# Patient Record
Sex: Female | Born: 1950 | Race: White | Hispanic: No | Marital: Married | Smoking: Never smoker
Health system: Southern US, Community
[De-identification: ages and names within clinical notes are randomized; demographics above are authoritative.]

## PROBLEM LIST (undated history)

## (undated) DIAGNOSIS — C801 Malignant (primary) neoplasm, unspecified: Secondary | ICD-10-CM

## (undated) DIAGNOSIS — T4145XA Adverse effect of unspecified anesthetic, initial encounter: Secondary | ICD-10-CM

## (undated) DIAGNOSIS — E875 Hyperkalemia: Secondary | ICD-10-CM

## (undated) DIAGNOSIS — I10 Essential (primary) hypertension: Secondary | ICD-10-CM

## (undated) DIAGNOSIS — F411 Generalized anxiety disorder: Secondary | ICD-10-CM

## (undated) DIAGNOSIS — M1990 Unspecified osteoarthritis, unspecified site: Secondary | ICD-10-CM

## (undated) DIAGNOSIS — T8859XA Other complications of anesthesia, initial encounter: Secondary | ICD-10-CM

## (undated) HISTORY — PX: SKIN CANCER EXCISION: SHX779

## (undated) HISTORY — PX: TOTAL HIP ARTHROPLASTY: SHX124

## (undated) HISTORY — DX: Generalized anxiety disorder: F41.1

## (undated) HISTORY — DX: Hyperkalemia: E87.5

## (undated) HISTORY — DX: Essential (primary) hypertension: I10

---

## 2007-08-23 LAB — HM COLONOSCOPY

## 2013-11-23 DIAGNOSIS — IMO0002 Reserved for concepts with insufficient information to code with codable children: Secondary | ICD-10-CM | POA: Diagnosis not present

## 2013-11-23 DIAGNOSIS — M25569 Pain in unspecified knee: Secondary | ICD-10-CM | POA: Diagnosis not present

## 2013-11-23 DIAGNOSIS — M1710 Unilateral primary osteoarthritis, unspecified knee: Secondary | ICD-10-CM | POA: Diagnosis not present

## 2014-01-03 DIAGNOSIS — M1710 Unilateral primary osteoarthritis, unspecified knee: Secondary | ICD-10-CM | POA: Diagnosis not present

## 2014-01-03 DIAGNOSIS — IMO0002 Reserved for concepts with insufficient information to code with codable children: Secondary | ICD-10-CM | POA: Diagnosis not present

## 2014-04-06 DIAGNOSIS — H26499 Other secondary cataract, unspecified eye: Secondary | ICD-10-CM | POA: Diagnosis not present

## 2014-04-06 DIAGNOSIS — H43399 Other vitreous opacities, unspecified eye: Secondary | ICD-10-CM | POA: Diagnosis not present

## 2014-04-12 DIAGNOSIS — L570 Actinic keratosis: Secondary | ICD-10-CM | POA: Diagnosis not present

## 2014-04-12 DIAGNOSIS — Z85828 Personal history of other malignant neoplasm of skin: Secondary | ICD-10-CM | POA: Diagnosis not present

## 2014-04-12 DIAGNOSIS — L821 Other seborrheic keratosis: Secondary | ICD-10-CM | POA: Diagnosis not present

## 2014-08-30 DIAGNOSIS — Z6827 Body mass index (BMI) 27.0-27.9, adult: Secondary | ICD-10-CM | POA: Diagnosis not present

## 2014-08-30 DIAGNOSIS — J302 Other seasonal allergic rhinitis: Secondary | ICD-10-CM | POA: Diagnosis not present

## 2014-08-30 DIAGNOSIS — J329 Chronic sinusitis, unspecified: Secondary | ICD-10-CM | POA: Diagnosis not present

## 2014-08-30 DIAGNOSIS — J310 Chronic rhinitis: Secondary | ICD-10-CM | POA: Diagnosis not present

## 2014-12-08 DIAGNOSIS — I10 Essential (primary) hypertension: Secondary | ICD-10-CM | POA: Diagnosis not present

## 2014-12-08 DIAGNOSIS — M179 Osteoarthritis of knee, unspecified: Secondary | ICD-10-CM | POA: Diagnosis not present

## 2014-12-08 DIAGNOSIS — G2581 Restless legs syndrome: Secondary | ICD-10-CM | POA: Diagnosis not present

## 2014-12-08 DIAGNOSIS — Z6827 Body mass index (BMI) 27.0-27.9, adult: Secondary | ICD-10-CM | POA: Diagnosis not present

## 2015-02-05 DIAGNOSIS — M9905 Segmental and somatic dysfunction of pelvic region: Secondary | ICD-10-CM | POA: Diagnosis not present

## 2015-02-05 DIAGNOSIS — M9904 Segmental and somatic dysfunction of sacral region: Secondary | ICD-10-CM | POA: Diagnosis not present

## 2015-02-05 DIAGNOSIS — M9902 Segmental and somatic dysfunction of thoracic region: Secondary | ICD-10-CM | POA: Diagnosis not present

## 2015-02-05 DIAGNOSIS — M9903 Segmental and somatic dysfunction of lumbar region: Secondary | ICD-10-CM | POA: Diagnosis not present

## 2015-02-07 DIAGNOSIS — M9904 Segmental and somatic dysfunction of sacral region: Secondary | ICD-10-CM | POA: Diagnosis not present

## 2015-02-07 DIAGNOSIS — M9903 Segmental and somatic dysfunction of lumbar region: Secondary | ICD-10-CM | POA: Diagnosis not present

## 2015-02-07 DIAGNOSIS — M9902 Segmental and somatic dysfunction of thoracic region: Secondary | ICD-10-CM | POA: Diagnosis not present

## 2015-02-07 DIAGNOSIS — M9905 Segmental and somatic dysfunction of pelvic region: Secondary | ICD-10-CM | POA: Diagnosis not present

## 2015-02-12 DIAGNOSIS — M9903 Segmental and somatic dysfunction of lumbar region: Secondary | ICD-10-CM | POA: Diagnosis not present

## 2015-02-12 DIAGNOSIS — M9904 Segmental and somatic dysfunction of sacral region: Secondary | ICD-10-CM | POA: Diagnosis not present

## 2015-02-12 DIAGNOSIS — M9902 Segmental and somatic dysfunction of thoracic region: Secondary | ICD-10-CM | POA: Diagnosis not present

## 2015-02-12 DIAGNOSIS — M9905 Segmental and somatic dysfunction of pelvic region: Secondary | ICD-10-CM | POA: Diagnosis not present

## 2015-04-11 DIAGNOSIS — D485 Neoplasm of uncertain behavior of skin: Secondary | ICD-10-CM | POA: Diagnosis not present

## 2015-04-11 DIAGNOSIS — L821 Other seborrheic keratosis: Secondary | ICD-10-CM | POA: Diagnosis not present

## 2015-04-11 DIAGNOSIS — C44722 Squamous cell carcinoma of skin of right lower limb, including hip: Secondary | ICD-10-CM | POA: Diagnosis not present

## 2015-04-11 DIAGNOSIS — L853 Xerosis cutis: Secondary | ICD-10-CM | POA: Diagnosis not present

## 2015-04-26 DIAGNOSIS — C44722 Squamous cell carcinoma of skin of right lower limb, including hip: Secondary | ICD-10-CM | POA: Diagnosis not present

## 2015-06-12 DIAGNOSIS — M9905 Segmental and somatic dysfunction of pelvic region: Secondary | ICD-10-CM | POA: Diagnosis not present

## 2015-06-12 DIAGNOSIS — M9902 Segmental and somatic dysfunction of thoracic region: Secondary | ICD-10-CM | POA: Diagnosis not present

## 2015-06-12 DIAGNOSIS — M9903 Segmental and somatic dysfunction of lumbar region: Secondary | ICD-10-CM | POA: Diagnosis not present

## 2015-06-12 DIAGNOSIS — M9904 Segmental and somatic dysfunction of sacral region: Secondary | ICD-10-CM | POA: Diagnosis not present

## 2015-09-24 DIAGNOSIS — M25561 Pain in right knee: Secondary | ICD-10-CM | POA: Diagnosis not present

## 2015-09-24 DIAGNOSIS — M1711 Unilateral primary osteoarthritis, right knee: Secondary | ICD-10-CM | POA: Diagnosis not present

## 2015-10-17 DIAGNOSIS — M1711 Unilateral primary osteoarthritis, right knee: Secondary | ICD-10-CM | POA: Diagnosis not present

## 2015-10-24 DIAGNOSIS — M1711 Unilateral primary osteoarthritis, right knee: Secondary | ICD-10-CM | POA: Diagnosis not present

## 2015-10-31 DIAGNOSIS — M1711 Unilateral primary osteoarthritis, right knee: Secondary | ICD-10-CM | POA: Diagnosis not present

## 2015-12-12 DIAGNOSIS — M1711 Unilateral primary osteoarthritis, right knee: Secondary | ICD-10-CM | POA: Diagnosis not present

## 2015-12-20 DIAGNOSIS — M461 Sacroiliitis, not elsewhere classified: Secondary | ICD-10-CM | POA: Diagnosis not present

## 2015-12-20 DIAGNOSIS — I10 Essential (primary) hypertension: Secondary | ICD-10-CM | POA: Diagnosis not present

## 2015-12-20 DIAGNOSIS — Z6825 Body mass index (BMI) 25.0-25.9, adult: Secondary | ICD-10-CM | POA: Diagnosis not present

## 2015-12-20 DIAGNOSIS — M179 Osteoarthritis of knee, unspecified: Secondary | ICD-10-CM | POA: Diagnosis not present

## 2016-01-10 DIAGNOSIS — M1711 Unilateral primary osteoarthritis, right knee: Secondary | ICD-10-CM | POA: Insufficient documentation

## 2016-01-10 DIAGNOSIS — Z96641 Presence of right artificial hip joint: Secondary | ICD-10-CM | POA: Diagnosis not present

## 2016-01-10 DIAGNOSIS — M21061 Valgus deformity, not elsewhere classified, right knee: Secondary | ICD-10-CM | POA: Diagnosis not present

## 2016-01-10 DIAGNOSIS — M1612 Unilateral primary osteoarthritis, left hip: Secondary | ICD-10-CM | POA: Diagnosis not present

## 2016-01-10 DIAGNOSIS — M47898 Other spondylosis, sacral and sacrococcygeal region: Secondary | ICD-10-CM | POA: Diagnosis not present

## 2016-01-10 HISTORY — DX: Unilateral primary osteoarthritis, right knee: M17.11

## 2016-01-17 ENCOUNTER — Other Ambulatory Visit: Payer: Self-pay | Admitting: Orthopedic Surgery

## 2016-01-23 DIAGNOSIS — G4762 Sleep related leg cramps: Secondary | ICD-10-CM | POA: Diagnosis not present

## 2016-01-23 DIAGNOSIS — Z6825 Body mass index (BMI) 25.0-25.9, adult: Secondary | ICD-10-CM | POA: Diagnosis not present

## 2016-02-01 DIAGNOSIS — C44729 Squamous cell carcinoma of skin of left lower limb, including hip: Secondary | ICD-10-CM | POA: Diagnosis not present

## 2016-02-01 DIAGNOSIS — D485 Neoplasm of uncertain behavior of skin: Secondary | ICD-10-CM | POA: Diagnosis not present

## 2016-02-14 DIAGNOSIS — C44729 Squamous cell carcinoma of skin of left lower limb, including hip: Secondary | ICD-10-CM | POA: Diagnosis not present

## 2016-02-28 ENCOUNTER — Encounter (HOSPITAL_COMMUNITY): Payer: Self-pay

## 2016-02-28 ENCOUNTER — Ambulatory Visit (HOSPITAL_COMMUNITY)
Admission: RE | Admit: 2016-02-28 | Discharge: 2016-02-28 | Disposition: A | Discharge status: Home / Self Care | Payer: Medicare Other | Source: Ambulatory Visit | Attending: Orthopedic Surgery | Admitting: Orthopedic Surgery

## 2016-02-28 ENCOUNTER — Encounter (HOSPITAL_COMMUNITY)
Admission: RE | Admit: 2016-02-28 | Discharge: 2016-02-28 | Disposition: A | Discharge status: Home / Self Care | Payer: Medicare Other | Source: Ambulatory Visit | Attending: Orthopedic Surgery | Admitting: Orthopedic Surgery

## 2016-02-28 ENCOUNTER — Other Ambulatory Visit (HOSPITAL_COMMUNITY): Payer: Self-pay | Admitting: *Deleted

## 2016-02-28 DIAGNOSIS — R9431 Abnormal electrocardiogram [ECG] [EKG]: Secondary | ICD-10-CM | POA: Diagnosis not present

## 2016-02-28 DIAGNOSIS — Z01818 Encounter for other preprocedural examination: Secondary | ICD-10-CM | POA: Insufficient documentation

## 2016-02-28 DIAGNOSIS — M1711 Unilateral primary osteoarthritis, right knee: Secondary | ICD-10-CM | POA: Insufficient documentation

## 2016-02-28 DIAGNOSIS — Z01812 Encounter for preprocedural laboratory examination: Secondary | ICD-10-CM | POA: Insufficient documentation

## 2016-02-28 HISTORY — DX: Malignant (primary) neoplasm, unspecified: C80.1

## 2016-02-28 HISTORY — DX: Unspecified osteoarthritis, unspecified site: M19.90

## 2016-02-28 HISTORY — DX: Other complications of anesthesia, initial encounter: T88.59XA

## 2016-02-28 HISTORY — DX: Adverse effect of unspecified anesthetic, initial encounter: T41.45XA

## 2016-02-28 HISTORY — DX: Essential (primary) hypertension: I10

## 2016-02-28 LAB — CBC WITH DIFFERENTIAL/PLATELET
Basophils Absolute: 0 10*3/uL (ref 0.0–0.1)
Basophils Relative: 0 %
EOS PCT: 1 %
Eosinophils Absolute: 0.1 10*3/uL (ref 0.0–0.7)
HCT: 42.7 % (ref 36.0–46.0)
HEMOGLOBIN: 14 g/dL (ref 12.0–15.0)
LYMPHS ABS: 2.6 10*3/uL (ref 0.7–4.0)
LYMPHS PCT: 24 %
MCH: 29.2 pg (ref 26.0–34.0)
MCHC: 32.8 g/dL (ref 30.0–36.0)
MCV: 89 fL (ref 78.0–100.0)
MONOS PCT: 5 %
Monocytes Absolute: 0.6 10*3/uL (ref 0.1–1.0)
Neutro Abs: 7.6 10*3/uL (ref 1.7–7.7)
Neutrophils Relative %: 70 %
PLATELETS: 367 10*3/uL (ref 150–400)
RBC: 4.8 MIL/uL (ref 3.87–5.11)
RDW: 14 % (ref 11.5–15.5)
WBC: 10.9 10*3/uL — AB (ref 4.0–10.5)

## 2016-02-28 LAB — COMPREHENSIVE METABOLIC PANEL
ALK PHOS: 80 U/L (ref 38–126)
ALT: 18 U/L (ref 14–54)
AST: 22 U/L (ref 15–41)
Albumin: 3.9 g/dL (ref 3.5–5.0)
Anion gap: 8 (ref 5–15)
BUN: 17 mg/dL (ref 6–20)
CALCIUM: 9.5 mg/dL (ref 8.9–10.3)
CO2: 27 mmol/L (ref 22–32)
CREATININE: 0.98 mg/dL (ref 0.44–1.00)
Chloride: 103 mmol/L (ref 101–111)
GFR, EST NON AFRICAN AMERICAN: 58 mL/min — AB (ref >60)
Glucose, Bld: 89 mg/dL (ref 65–99)
Potassium: 4.3 mmol/L (ref 3.5–5.1)
Sodium: 138 mmol/L (ref 135–145)
Total Bilirubin: 0.4 mg/dL (ref 0.3–1.2)
Total Protein: 6.8 g/dL (ref 6.5–8.1)

## 2016-02-28 LAB — URINALYSIS, ROUTINE W REFLEX MICROSCOPIC
BILIRUBIN URINE: NEGATIVE
GLUCOSE, UA: NEGATIVE mg/dL
HGB URINE DIPSTICK: NEGATIVE
KETONES UR: NEGATIVE mg/dL
Nitrite: NEGATIVE
PROTEIN: NEGATIVE mg/dL
Specific Gravity, Urine: 1.007 (ref 1.005–1.030)
pH: 7 (ref 5.0–8.0)

## 2016-02-28 LAB — URINE MICROSCOPIC-ADD ON: RBC / HPF: NONE SEEN RBC/hpf (ref 0–5)

## 2016-02-28 LAB — PROTIME-INR
INR: 0.91 (ref 0.00–1.49)
PROTHROMBIN TIME: 12.5 s (ref 11.6–15.2)

## 2016-02-28 LAB — APTT: aPTT: 25 seconds (ref 24–37)

## 2016-02-28 LAB — SURGICAL PCR SCREEN
MRSA, PCR: NEGATIVE
STAPHYLOCOCCUS AUREUS: NEGATIVE

## 2016-02-28 NOTE — Progress Notes (Signed)
Pt denies cardiologist or cardiac history or cardiac testing.

## 2016-02-28 NOTE — Pre-Procedure Instructions (Signed)
    Javonda Essig  02/28/2016     No Pharmacies Listed   Your procedure is scheduled on Monday July 24th at 9:35 am  Report to [facility] Admitting at 7:30 am  Call this number if you have problems the morning of surgery:  [phone]   Remember:  Do not eat food or drink liquids after midnight.  Take these medicines the morning of surgery with A SIP OF WATER: pain medicine if needed Stop taking any aspirin containing products, Nsaids( including your celebrex, aleve ibuprofen, advil, motrin, naproxen), vitamins and herbal supplements.  Do not wear jewelry, make-up or nail polish.  Do not wear lotions, powders, or perfumes.  You may not wear deoderant.  Do not shave 48 hours prior to surgery.   Do not bring valuables to the hospital.  [facility] is not responsible for any belongings or valuables.  Contacts, dentures or bridgework may not be worn into surgery.  Leave your suitcase in the car.  After surgery it may be brought to your room.  For patients admitted to the hospital, discharge time will be determined by your treatment team.   Special instructions:  Shower with CHG the night before and morning of surgery as instructed  Please read over the following fact sheets that you were given. MRSA Information, incentive spirometry, shower instructions

## 2016-02-29 LAB — URINE CULTURE

## 2016-03-07 MED ORDER — TRANEXAMIC ACID 1000 MG/10ML IV SOLN
1000.0000 mg | INTRAVENOUS | Status: AC
  Administered 2016-03-10: 1000 mg via INTRAVENOUS
  Filled 2016-03-07: qty 10

## 2016-03-10 ENCOUNTER — Encounter (HOSPITAL_COMMUNITY)
Admission: RE | Disposition: A | Discharge status: Home Health | Payer: Self-pay | Source: Ambulatory Visit | Attending: Orthopedic Surgery

## 2016-03-10 ENCOUNTER — Inpatient Hospital Stay (HOSPITAL_COMMUNITY): Payer: Medicare Other | Admitting: Anesthesiology

## 2016-03-10 ENCOUNTER — Encounter (HOSPITAL_COMMUNITY): Payer: Self-pay | Admitting: General Practice

## 2016-03-10 ENCOUNTER — Inpatient Hospital Stay (HOSPITAL_COMMUNITY)
Admission: RE | Admit: 2016-03-10 | Discharge: 2016-03-11 | DRG: 470 | Disposition: A | Discharge status: Home Health | Payer: Medicare Other | Source: Ambulatory Visit | Attending: Orthopedic Surgery | Admitting: Orthopedic Surgery

## 2016-03-10 DIAGNOSIS — I10 Essential (primary) hypertension: Secondary | ICD-10-CM | POA: Diagnosis present

## 2016-03-10 DIAGNOSIS — Z96641 Presence of right artificial hip joint: Secondary | ICD-10-CM | POA: Diagnosis present

## 2016-03-10 DIAGNOSIS — M1711 Unilateral primary osteoarthritis, right knee: Principal | ICD-10-CM | POA: Diagnosis present

## 2016-03-10 DIAGNOSIS — Z96659 Presence of unspecified artificial knee joint: Secondary | ICD-10-CM

## 2016-03-10 DIAGNOSIS — D62 Acute posthemorrhagic anemia: Secondary | ICD-10-CM | POA: Diagnosis not present

## 2016-03-10 DIAGNOSIS — M25561 Pain in right knee: Secondary | ICD-10-CM | POA: Diagnosis not present

## 2016-03-10 DIAGNOSIS — M179 Osteoarthritis of knee, unspecified: Secondary | ICD-10-CM | POA: Diagnosis not present

## 2016-03-10 HISTORY — PX: TOTAL KNEE ARTHROPLASTY: SHX125

## 2016-03-10 HISTORY — DX: Presence of unspecified artificial knee joint: Z96.659

## 2016-03-10 SURGERY — ARTHROPLASTY, KNEE, TOTAL
Anesthesia: Monitor Anesthesia Care | Site: Knee | Laterality: Right

## 2016-03-10 MED ORDER — BUPIVACAINE-EPINEPHRINE (PF) 0.25% -1:200000 IJ SOLN
INTRAMUSCULAR | Status: DC | PRN
  Administered 2016-03-10: 20 mL

## 2016-03-10 MED ORDER — HYDROMORPHONE HCL 1 MG/ML IJ SOLN
1.0000 mg | INTRAMUSCULAR | Status: DC | PRN
  Administered 2016-03-10 – 2016-03-11 (×4): 1 mg via INTRAVENOUS
  Filled 2016-03-10 (×5): qty 1

## 2016-03-10 MED ORDER — MENTHOL 3 MG MT LOZG: 1.0000 | LOZENGE | OROMUCOSAL | Status: DC | PRN

## 2016-03-10 MED ORDER — OXYCODONE HCL 5 MG PO TABS
5.0000 mg | ORAL_TABLET | ORAL | Status: DC | PRN
  Administered 2016-03-10 – 2016-03-11 (×6): 10 mg via ORAL
  Filled 2016-03-10 (×6): qty 2

## 2016-03-10 MED ORDER — PROPOFOL 500 MG/50ML IV EMUL
INTRAVENOUS | Status: DC | PRN
  Administered 2016-03-10: 50 ug/kg/min via INTRAVENOUS

## 2016-03-10 MED ORDER — SODIUM CHLORIDE 0.9 % IV SOLN
INTRAVENOUS | Status: DC
  Administered 2016-03-10: 15:00:00 via INTRAVENOUS

## 2016-03-10 MED ORDER — OXYCODONE HCL ER 10 MG PO T12A
10.0000 mg | EXTENDED_RELEASE_TABLET | Freq: Two times a day (BID) | ORAL | Status: DC
  Administered 2016-03-10 – 2016-03-11 (×3): 10 mg via ORAL
  Filled 2016-03-10 (×3): qty 1

## 2016-03-10 MED ORDER — ASPIRIN EC 325 MG PO TBEC
325.0000 mg | DELAYED_RELEASE_TABLET | Freq: Two times a day (BID) | ORAL | Status: DC
  Administered 2016-03-11: 325 mg via ORAL
  Filled 2016-03-10: qty 1

## 2016-03-10 MED ORDER — PHENOL 1.4 % MT LIQD: 1.0000 | OROMUCOSAL | Status: DC | PRN

## 2016-03-10 MED ORDER — ONDANSETRON HCL 4 MG/2ML IJ SOLN
4.0000 mg | Freq: Four times a day (QID) | INTRAMUSCULAR | Status: DC | PRN
  Administered 2016-03-11: 4 mg via INTRAVENOUS
  Filled 2016-03-10: qty 2

## 2016-03-10 MED ORDER — FENTANYL CITRATE (PF) 100 MCG/2ML IJ SOLN
INTRAMUSCULAR | Status: DC | PRN
  Administered 2016-03-10: 50 ug via INTRAVENOUS
  Administered 2016-03-10: 25 ug via INTRAVENOUS

## 2016-03-10 MED ORDER — ALUM & MAG HYDROXIDE-SIMETH 200-200-20 MG/5ML PO SUSP: 30.0000 mL | ORAL | Status: DC | PRN

## 2016-03-10 MED ORDER — ACETAMINOPHEN 10 MG/ML IV SOLN
INTRAVENOUS | Status: DC | PRN
  Administered 2016-03-10: 1000 mg via INTRAVENOUS

## 2016-03-10 MED ORDER — METOCLOPRAMIDE HCL 5 MG PO TABS: 5.0000 mg | ORAL_TABLET | Freq: Three times a day (TID) | ORAL | Status: DC | PRN

## 2016-03-10 MED ORDER — PROMETHAZINE HCL 25 MG/ML IJ SOLN: 6.2500 mg | INTRAMUSCULAR | Status: DC | PRN

## 2016-03-10 MED ORDER — ONDANSETRON HCL 4 MG/2ML IJ SOLN
INTRAMUSCULAR | Status: DC | PRN
  Administered 2016-03-10: 4 mg via INTRAVENOUS

## 2016-03-10 MED ORDER — MIDAZOLAM HCL 2 MG/2ML IJ SOLN
INTRAMUSCULAR | Status: AC
  Filled 2016-03-10: qty 2

## 2016-03-10 MED ORDER — DEXAMETHASONE SODIUM PHOSPHATE 10 MG/ML IJ SOLN
10.0000 mg | Freq: Once | INTRAMUSCULAR | Status: AC
  Administered 2016-03-11: 10 mg via INTRAVENOUS
  Filled 2016-03-10: qty 1

## 2016-03-10 MED ORDER — 0.9 % SODIUM CHLORIDE (POUR BTL) OPTIME
TOPICAL | Status: DC | PRN
  Administered 2016-03-10: 1000 mL

## 2016-03-10 MED ORDER — MIDAZOLAM HCL 5 MG/5ML IJ SOLN
INTRAMUSCULAR | Status: DC | PRN
  Administered 2016-03-10: 2 mg via INTRAVENOUS

## 2016-03-10 MED ORDER — SODIUM CHLORIDE 0.9 % IJ SOLN
INTRAMUSCULAR | Status: DC | PRN
  Administered 2016-03-10: 20 mL

## 2016-03-10 MED ORDER — ONDANSETRON HCL 4 MG PO TABS: 4.0000 mg | ORAL_TABLET | Freq: Four times a day (QID) | ORAL | Status: DC | PRN

## 2016-03-10 MED ORDER — CEFAZOLIN IN D5W 1 GM/50ML IV SOLN
1.0000 g | Freq: Four times a day (QID) | INTRAVENOUS | Status: AC
  Administered 2016-03-10 (×2): 1 g via INTRAVENOUS
  Filled 2016-03-10 (×2): qty 50

## 2016-03-10 MED ORDER — OXYCODONE HCL 5 MG PO TABS
5.0000 mg | ORAL_TABLET | Freq: Once | ORAL | Status: DC | PRN
Start: 2016-03-10 — End: 2016-03-10

## 2016-03-10 MED ORDER — METHOCARBAMOL 1000 MG/10ML IJ SOLN
500.0000 mg | Freq: Four times a day (QID) | INTRAMUSCULAR | Status: DC | PRN
  Filled 2016-03-10: qty 5

## 2016-03-10 MED ORDER — DIPHENHYDRAMINE HCL 12.5 MG/5ML PO ELIX: 12.5000 mg | ORAL_SOLUTION | ORAL | Status: DC | PRN

## 2016-03-10 MED ORDER — CHLORHEXIDINE GLUCONATE 4 % EX LIQD: 60.0000 mL | Freq: Once | CUTANEOUS | Status: DC

## 2016-03-10 MED ORDER — LORAZEPAM 0.5 MG PO TABS
0.5000 mg | ORAL_TABLET | Freq: Three times a day (TID) | ORAL | Status: DC | PRN
  Administered 2016-03-11: 0.5 mg via ORAL
  Filled 2016-03-10: qty 1

## 2016-03-10 MED ORDER — TRANEXAMIC ACID 1000 MG/10ML IV SOLN
1000.0000 mg | Freq: Once | INTRAVENOUS | Status: AC
  Administered 2016-03-10: 1000 mg via INTRAVENOUS
  Filled 2016-03-10: qty 10

## 2016-03-10 MED ORDER — METOCLOPRAMIDE HCL 5 MG/ML IJ SOLN: 5.0000 mg | Freq: Three times a day (TID) | INTRAMUSCULAR | Status: DC | PRN

## 2016-03-10 MED ORDER — HYDROMORPHONE HCL 1 MG/ML IJ SOLN: 0.2500 mg | INTRAMUSCULAR | Status: DC | PRN

## 2016-03-10 MED ORDER — IRBESARTAN 150 MG PO TABS
150.0000 mg | ORAL_TABLET | Freq: Every day | ORAL | Status: DC
  Administered 2016-03-10 – 2016-03-11 (×2): 150 mg via ORAL
  Filled 2016-03-10 (×2): qty 1

## 2016-03-10 MED ORDER — FENTANYL CITRATE (PF) 250 MCG/5ML IJ SOLN
INTRAMUSCULAR | Status: AC
  Filled 2016-03-10: qty 5

## 2016-03-10 MED ORDER — BUPIVACAINE LIPOSOME 1.3 % IJ SUSP
20.0000 mL | INTRAMUSCULAR | Status: AC
  Administered 2016-03-10: 20 mL
  Filled 2016-03-10: qty 20

## 2016-03-10 MED ORDER — ACETAMINOPHEN 650 MG RE SUPP: 650.0000 mg | Freq: Four times a day (QID) | RECTAL | Status: DC | PRN

## 2016-03-10 MED ORDER — SODIUM CHLORIDE 0.9 % IR SOLN
Status: DC | PRN
  Administered 2016-03-10: 1000 mL

## 2016-03-10 MED ORDER — METHOCARBAMOL 500 MG PO TABS
500.0000 mg | ORAL_TABLET | Freq: Four times a day (QID) | ORAL | Status: DC | PRN
  Administered 2016-03-10 – 2016-03-11 (×3): 500 mg via ORAL
  Filled 2016-03-10 (×3): qty 1

## 2016-03-10 MED ORDER — PHENYLEPHRINE 40 MCG/ML (10ML) SYRINGE FOR IV PUSH (FOR BLOOD PRESSURE SUPPORT)
PREFILLED_SYRINGE | INTRAVENOUS | Status: AC
  Filled 2016-03-10: qty 10

## 2016-03-10 MED ORDER — PHENYLEPHRINE HCL 10 MG/ML IJ SOLN
INTRAVENOUS | Status: DC | PRN
  Administered 2016-03-10: 40 ug/min via INTRAVENOUS

## 2016-03-10 MED ORDER — PROPOFOL 10 MG/ML IV BOLUS
INTRAVENOUS | Status: DC | PRN
  Administered 2016-03-10: 10 mg via INTRAVENOUS
  Administered 2016-03-10: 20 mg via INTRAVENOUS
  Administered 2016-03-10: 10 mg via INTRAVENOUS

## 2016-03-10 MED ORDER — ONDANSETRON HCL 4 MG/2ML IJ SOLN
INTRAMUSCULAR | Status: AC
  Filled 2016-03-10: qty 2

## 2016-03-10 MED ORDER — ACETAMINOPHEN 325 MG PO TABS
650.0000 mg | ORAL_TABLET | Freq: Four times a day (QID) | ORAL | Status: DC | PRN
  Administered 2016-03-11: 650 mg via ORAL
  Filled 2016-03-10: qty 2

## 2016-03-10 MED ORDER — ACETAMINOPHEN 500 MG PO TABS
1000.0000 mg | ORAL_TABLET | Freq: Four times a day (QID) | ORAL | Status: AC
Start: 2016-03-10 — End: 2016-03-11
  Administered 2016-03-10 – 2016-03-11 (×2): 1000 mg via ORAL
  Filled 2016-03-10 (×3): qty 2

## 2016-03-10 MED ORDER — PROPOFOL 1000 MG/100ML IV EMUL
INTRAVENOUS | Status: AC
  Filled 2016-03-10: qty 100

## 2016-03-10 MED ORDER — ACETAMINOPHEN 500 MG PO TABS
1000.0000 mg | ORAL_TABLET | Freq: Once | ORAL | Status: AC
  Administered 2016-03-10: 1000 mg via ORAL
  Filled 2016-03-10: qty 2

## 2016-03-10 MED ORDER — ZOLPIDEM TARTRATE 5 MG PO TABS: 5.0000 mg | ORAL_TABLET | Freq: Every evening | ORAL | Status: DC | PRN

## 2016-03-10 MED ORDER — CEFAZOLIN SODIUM-DEXTROSE 2-4 GM/100ML-% IV SOLN
2.0000 g | INTRAVENOUS | Status: AC
Start: 2016-03-10 — End: 2016-03-10
  Administered 2016-03-10: 2 g via INTRAVENOUS
  Filled 2016-03-10: qty 100

## 2016-03-10 MED ORDER — ACETAMINOPHEN 10 MG/ML IV SOLN
INTRAVENOUS | Status: AC
  Filled 2016-03-10: qty 100

## 2016-03-10 MED ORDER — BUPIVACAINE HCL (PF) 0.75 % IJ SOLN
INTRAMUSCULAR | Status: DC | PRN
  Administered 2016-03-10: 2 mL via INTRATHECAL

## 2016-03-10 MED ORDER — BUPIVACAINE-EPINEPHRINE (PF) 0.25% -1:200000 IJ SOLN
INTRAMUSCULAR | Status: AC
  Filled 2016-03-10: qty 30

## 2016-03-10 MED ORDER — OXYCODONE HCL 5 MG/5ML PO SOLN: 5.0000 mg | Freq: Once | ORAL | Status: DC | PRN

## 2016-03-10 MED ORDER — SODIUM CHLORIDE 0.9 % IV SOLN: INTRAVENOUS | Status: DC

## 2016-03-10 MED ORDER — LACTATED RINGERS IV SOLN
INTRAVENOUS | Status: DC
  Administered 2016-03-10 (×2): via INTRAVENOUS

## 2016-03-10 MED ORDER — DOCUSATE SODIUM 100 MG PO CAPS
100.0000 mg | ORAL_CAPSULE | Freq: Two times a day (BID) | ORAL | Status: DC
  Administered 2016-03-10 (×2): 100 mg via ORAL
  Filled 2016-03-10 (×3): qty 1

## 2016-03-10 MED ORDER — PHENYLEPHRINE HCL 10 MG/ML IJ SOLN
INTRAMUSCULAR | Status: DC | PRN
  Administered 2016-03-10: 100 ug via INTRAVENOUS
  Administered 2016-03-10: 40 ug via INTRAVENOUS
  Administered 2016-03-10: 80 ug via INTRAVENOUS

## 2016-03-10 SURGICAL SUPPLY — 64 items
BANDAGE ACE 6X5 VEL STRL LF (GAUZE/BANDAGES/DRESSINGS) ×3 IMPLANT
BANDAGE ESMARK 6X9 LF (GAUZE/BANDAGES/DRESSINGS) ×1 IMPLANT
BLADE SAGITTAL 13X1.27X60 (BLADE) ×2 IMPLANT
BLADE SAGITTAL 13X1.27X60MM (BLADE) ×1
BLADE SAW SGTL 83.5X18.5 (BLADE) ×3 IMPLANT
BLADE SURG 10 STRL SS (BLADE) ×3 IMPLANT
BNDG ESMARK 6X9 LF (GAUZE/BANDAGES/DRESSINGS) ×3
BOWL SMART MIX CTS (DISPOSABLE) ×3 IMPLANT
CAP KNEE TOTAL 3 SIGMA ×3 IMPLANT
CEMENT BONE SIMPLEX SPEEDSET (Cement) ×6 IMPLANT
CLOSURE WOUND 1/2 X4 (GAUZE/BANDAGES/DRESSINGS) ×1
COVER SURGICAL LIGHT HANDLE (MISCELLANEOUS) ×3 IMPLANT
CUFF TOURNIQUET SINGLE 34IN LL (TOURNIQUET CUFF) ×3 IMPLANT
DRAPE EXTREMITY T 121X128X90 (DRAPE) ×3 IMPLANT
DRAPE INCISE IOBAN 66X45 STRL (DRAPES) ×9 IMPLANT
DRAPE PROXIMA HALF (DRAPES) ×3 IMPLANT
DRAPE U-SHAPE 47X51 STRL (DRAPES) ×3 IMPLANT
DRESSING AQUACEL AG SP 3.5X10 (GAUZE/BANDAGES/DRESSINGS) ×1 IMPLANT
DRSG ADAPTIC 3X8 NADH LF (GAUZE/BANDAGES/DRESSINGS) IMPLANT
DRSG AQUACEL AG SP 3.5X10 (GAUZE/BANDAGES/DRESSINGS) ×3
DRSG PAD ABDOMINAL 8X10 ST (GAUZE/BANDAGES/DRESSINGS) ×3 IMPLANT
DURAPREP 26ML APPLICATOR (WOUND CARE) ×6 IMPLANT
ELECT REM PT RETURN 9FT ADLT (ELECTROSURGICAL) ×3
ELECTRODE REM PT RTRN 9FT ADLT (ELECTROSURGICAL) ×1 IMPLANT
GAUZE SPONGE 4X4 12PLY STRL (GAUZE/BANDAGES/DRESSINGS) IMPLANT
GLOVE BIOGEL M 7.0 STRL (GLOVE) IMPLANT
GLOVE BIOGEL PI IND STRL 7.5 (GLOVE) IMPLANT
GLOVE BIOGEL PI IND STRL 8.5 (GLOVE) ×3 IMPLANT
GLOVE BIOGEL PI INDICATOR 7.5 (GLOVE)
GLOVE BIOGEL PI INDICATOR 8.5 (GLOVE) ×6
GLOVE SURG ORTHO 8.0 STRL STRW (GLOVE) ×9 IMPLANT
GOWN STRL REUS W/ TWL LRG LVL3 (GOWN DISPOSABLE) ×1 IMPLANT
GOWN STRL REUS W/ TWL XL LVL3 (GOWN DISPOSABLE) ×2 IMPLANT
GOWN STRL REUS W/TWL 2XL LVL3 (GOWN DISPOSABLE) ×3 IMPLANT
GOWN STRL REUS W/TWL LRG LVL3 (GOWN DISPOSABLE) ×2
GOWN STRL REUS W/TWL XL LVL3 (GOWN DISPOSABLE) ×4
HANDPIECE INTERPULSE COAX TIP (DISPOSABLE) ×2
HOOD PEEL AWAY FACE SHEILD DIS (HOOD) ×9 IMPLANT
KIT BASIN OR (CUSTOM PROCEDURE TRAY) ×3 IMPLANT
KIT ROOM TURNOVER OR (KITS) ×3 IMPLANT
MANIFOLD NEPTUNE II (INSTRUMENTS) ×3 IMPLANT
NEEDLE 22X1 1/2 (OR ONLY) (NEEDLE) ×6 IMPLANT
NS IRRIG 1000ML POUR BTL (IV SOLUTION) ×3 IMPLANT
PACK TOTAL JOINT (CUSTOM PROCEDURE TRAY) ×3 IMPLANT
PACK UNIVERSAL I (CUSTOM PROCEDURE TRAY) ×3 IMPLANT
PAD ARMBOARD 7.5X6 YLW CONV (MISCELLANEOUS) ×6 IMPLANT
PADDING CAST COTTON 6X4 STRL (CAST SUPPLIES) IMPLANT
SET HNDPC FAN SPRY TIP SCT (DISPOSABLE) ×1 IMPLANT
STAPLER VISISTAT 35W (STAPLE) IMPLANT
STRIP CLOSURE SKIN 1/2X4 (GAUZE/BANDAGES/DRESSINGS) ×2 IMPLANT
SUCTION FRAZIER HANDLE 10FR (MISCELLANEOUS) ×2
SUCTION TUBE FRAZIER 10FR DISP (MISCELLANEOUS) ×1 IMPLANT
SUT BONE WAX W31G (SUTURE) ×3 IMPLANT
SUT MNCRL AB 3-0 PS2 18 (SUTURE) ×3 IMPLANT
SUT VIC AB 0 CTB1 27 (SUTURE) ×6 IMPLANT
SUT VIC AB 1 CT1 27 (SUTURE) ×4
SUT VIC AB 1 CT1 27XBRD ANBCTR (SUTURE) ×2 IMPLANT
SUT VIC AB 2-0 CT1 27 (SUTURE) ×4
SUT VIC AB 2-0 CT1 TAPERPNT 27 (SUTURE) ×2 IMPLANT
SYR 20CC LL (SYRINGE) ×6 IMPLANT
TOWEL OR 17X24 6PK STRL BLUE (TOWEL DISPOSABLE) ×3 IMPLANT
TOWEL OR 17X26 10 PK STRL BLUE (TOWEL DISPOSABLE) ×3 IMPLANT
TRAY CATH 16FR W/PLASTIC CATH (SET/KITS/TRAYS/PACK) ×3 IMPLANT
WATER STERILE IRR 1000ML POUR (IV SOLUTION) ×6 IMPLANT

## 2016-03-10 NOTE — Progress Notes (Signed)
PT Cancellation Note  Patient Details Name: Susan Roberson MRN: [id number] DOB: 1951-07-21   Cancelled Treatment:    Reason Eval/Treat Not Completed: Pain limiting ability to participate. Pt's nurse stated that pt was in significant pain and requested that therapist attempt at another time. PT will continue to f/u with pt as appropriate.   [place] 03/10/2016, 3:18 PM Sherie Don, [place], DPT [phone]

## 2016-03-10 NOTE — Transfer of Care (Signed)
Immediate Anesthesia Transfer of Care Note  Patient: Susan Roberson  Procedure(s) Performed: Procedure(s): RIGHT TOTAL KNEE ARTHROPLASTY (Right)  Patient Location: PACU  Anesthesia Type:MAC and Spinal  Level of Consciousness: awake, alert  and oriented  Airway & Oxygen Therapy: Patient Spontanous Breathing and Patient connected to nasal cannula oxygen  Post-op Assessment: Report given to RN and Post -op Vital signs reviewed and stable  Post vital signs: Reviewed and stable  Last Vitals:  Vitals:   03/10/16 0721  BP: (!) 151/75  Pulse: 68  Resp: 18  Temp: 36.6 C    Last Pain:  Vitals:   03/10/16 0721  PainSc: 8       Patients Stated Pain Goal: 4 ([id number])  Complications: No apparent anesthesia complications

## 2016-03-10 NOTE — Progress Notes (Signed)
Patient arrived in pain from PACU.  Pain is 5/10.

## 2016-03-10 NOTE — Progress Notes (Signed)
Orthopedic Tech Progress Note Patient Details:  Susan Roberson 1951/07/19 [id number]  CPM Right Knee CPM Right Knee: On Right Knee Flexion (Degrees): 90 Right Knee Extension (Degrees): 0 Additional Comments: Trapeze bar and foot roll   [place] Pink 03/10/2016, 3:24 PM

## 2016-03-10 NOTE — Progress Notes (Signed)
Orthopedic Tech Progress Note Patient Details:  Susan Roberson [birth year] [id number]  CPM Right Knee CPM Right Knee: On Right Knee Flexion (Degrees): 90 Right Knee Extension (Degrees): 0 Additional Comments: Trapeze bar and foot roll   [place] Pink 03/10/2016, 1:32 PM

## 2016-03-10 NOTE — Anesthesia Preprocedure Evaluation (Signed)
Anesthesia Evaluation  Patient identified by MRN, date of birth, ID band Patient awake    Reviewed: Allergy & Precautions, H&P , NPO status , Patient's Chart, lab work & pertinent test results  History of Anesthesia Complications (+) history of anesthetic complications  Airway Mallampati: II  TM Distance: >3 FB Neck ROM: full    Dental no notable dental hx.    Pulmonary neg pulmonary ROS,    Pulmonary exam normal breath sounds clear to auscultation       Cardiovascular hypertension, Pt. on medications Normal cardiovascular exam Rhythm:regular Rate:Normal     Neuro/Psych negative neurological ROS     GI/Hepatic negative GI ROS, Neg liver ROS,   Endo/Other  negative endocrine ROS  Renal/GU negative Renal ROS     Musculoskeletal  (+) Arthritis ,   Abdominal   Peds  Hematology negative hematology ROS (+)   Anesthesia Other Findings   Reproductive/Obstetrics negative OB ROS                             Anesthesia Physical Anesthesia Plan  ASA: II  Anesthesia Plan: Spinal   Post-op Pain Management:    Induction: Intravenous  Airway Management Planned:   Additional Equipment:   Intra-op Plan:   Post-operative Plan:   Informed Consent: I have reviewed the patients History and Physical, chart, labs and discussed the procedure including the risks, benefits and alternatives for the proposed anesthesia with the patient or authorized representative who has indicated his/her understanding and acceptance.   Dental Advisory Given  Plan Discussed with: Anesthesiologist, CRNA and Surgeon  Anesthesia Plan Comments:         Anesthesia Quick Evaluation

## 2016-03-10 NOTE — Anesthesia Procedure Notes (Signed)
Procedure Name: MAC Date/Time: 03/10/2016 9:43 AM Performed by: Clearnce Sorrel Pre-anesthesia Checklist: Patient identified, Emergency Drugs available, Suction available, Patient being monitored and Timeout performed Patient Re-evaluated:Patient Re-evaluated prior to inductionOxygen Delivery Method: Nasal cannula

## 2016-03-10 NOTE — Anesthesia Postprocedure Evaluation (Signed)
Anesthesia Post Note  Patient: Susan Roberson  Procedure(s) Performed: Procedure(s) (LRB): RIGHT TOTAL KNEE ARTHROPLASTY (Right)  Patient location during evaluation: PACU Anesthesia Type: Spinal Level of consciousness: oriented and awake and alert Pain management: pain level controlled Vital Signs Assessment: post-procedure vital signs reviewed and stable Respiratory status: spontaneous breathing, respiratory function stable and patient connected to nasal cannula oxygen Cardiovascular status: blood pressure returned to baseline and stable Postop Assessment: no headache and no backache Anesthetic complications: no    Last Vitals:  Vitals:   03/10/16 1315 03/10/16 1318  BP:    Pulse: 61   Resp: 19   Temp:  36.8 C    Last Pain:  Vitals:   03/10/16 1357  PainSc: 7                  Zenaida Deed

## 2016-03-10 NOTE — Anesthesia Procedure Notes (Signed)
Spinal  Patient location during procedure: OR Staffing Anesthesiologist: Kassandra Meriweather Performed: anesthesiologist  Preanesthetic Checklist Completed: patient identified, site marked, surgical consent, pre-op evaluation, timeout performed, IV checked, risks and benefits discussed and monitors and equipment checked Spinal Block Patient position: sitting Prep: ChloraPrep Patient monitoring: heart rate, continuous pulse ox and blood pressure Location: L3-4 Injection technique: single-shot Needle Needle type: Pencan  Needle gauge: 24 G Needle length: 9 cm Assessment Sensory level: T6 Additional Notes Expiration date of kit checked and confirmed. Patient tolerated procedure well, without complications.

## 2016-03-10 NOTE — H&P (Signed)
Susan Roberson MRN:  [id number] DOB/SEX:  06-27-1951/female  CHIEF COMPLAINT:  Painful right Knee  HISTORY: Patient is a 65 y.o. female presented with a history of pain in the right knee. Onset of symptoms was gradual starting a few years ago with gradually worsening course since that time. Patient has been treated conservatively with over-the-counter NSAIDs and activity modification. Patient currently rates pain in the knee at 10 out of 10 with activity. There is pain at night.  PAST MEDICAL HISTORY: There are no active problems to display for this patient.  Past Medical History:  Diagnosis Date  . Arthritis   . 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 Complication of anesthesia    broken teeth  . Hypertension    Past Surgical History:  Procedure Laterality Date  . SKIN CANCER EXCISION Bilateral    legs  . TOTAL HIP ARTHROPLASTY Right    5 yrs ago     MEDICATIONS:   No prescriptions prior to admission.    ALLERGIES:   Allergies  Allergen Reactions  . No Known Allergies     REVIEW OF SYSTEMS:  A comprehensive review of systems was negative except for: Musculoskeletal: positive for arthralgias and stiff joints   FAMILY HISTORY:  No family history on file.  SOCIAL HISTORY:   Social History  Substance Use Topics  . Smoking status: Never Smoker  . Smokeless tobacco: Not on file  . Alcohol use No     EXAMINATION:  Vital signs in last 24 hours:    There were no vitals taken for this visit.  General Appearance:    Alert, cooperative, no distress, appears stated age  Head:    Normocephalic, without obvious abnormality, atraumatic  Eyes:    PERRL, conjunctiva/corneas clear, EOM's intact, fundi    benign, both eyes  Ears:    Normal TM's and external ear canals, both ears  Nose:   Nares normal, septum midline, mucosa normal, no drainage    or sinus tenderness  Throat:   Lips, mucosa, and tongue normal; teeth and gums normal  Neck:    Supple, symmetrical, trachea midline, no adenopathy;    thyroid:  no enlargement/tenderness/nodules; no carotid   bruit or JVD  Back:     Symmetric, no curvature, ROM normal, no CVA tenderness  Lungs:     Clear to auscultation bilaterally, respirations unlabored  Chest Wall:    No tenderness or deformity   Heart:    Regular rate and rhythm, S1 and S2 normal, no murmur, rub   or gallop  Breast Exam:    No tenderness, masses, or nipple abnormality  Abdomen:     Soft, non-tender, bowel sounds active all four quadrants,    no masses, no organomegaly  Genitalia:    Normal female without lesion, discharge or tenderness  Rectal:    Normal tone, no masses or tenderness;   guaiac negative stool  Extremities:   Extremities normal, atraumatic, no cyanosis or edema  Pulses:   2+ and symmetric all extremities  Skin:   Skin color, texture, turgor normal, no rashes or lesions  Lymph nodes:   Cervical, supraclavicular, and axillary nodes normal  Neurologic:   CNII-XII intact, normal strength, sensation and reflexes    throughout     Musculoskeletal:  ROM 0-120, Ligaments intact,  Imaging Review Plain radiographs demonstrate severe degenerative joint disease of the right knee. The overall alignment is neutral. The bone quality appears to be excellent  for age and reported activity level.  Assessment/Plan: Primary osteoarthritis, right knee   The patient history, physical examination and imaging studies are consistent with advanced degenerative joint disease of the right knee. The patient has failed conservative treatment.  The clearance notes were reviewed.  After discussion with the patient it was felt that Total Knee Replacement was indicated. The procedure,  risks, and benefits of total knee arthroplasty were presented and reviewed. The risks including but not limited to aseptic loosening, infection, blood clots, vascular injury, stiffness, patella tracking problems complications among others were  discussed. The patient acknowledged the explanation, agreed to proceed with the plan.  Donia Ast 03/10/2016, 6:36 AM

## 2016-03-11 ENCOUNTER — Encounter (HOSPITAL_COMMUNITY): Payer: Self-pay | Admitting: Orthopedic Surgery

## 2016-03-11 LAB — BASIC METABOLIC PANEL
Anion gap: 8 (ref 5–15)
BUN: 9 mg/dL (ref 6–20)
CHLORIDE: 103 mmol/L (ref 101–111)
CO2: 26 mmol/L (ref 22–32)
CREATININE: 0.75 mg/dL (ref 0.44–1.00)
Calcium: 8.9 mg/dL (ref 8.9–10.3)
GFR calc Af Amer: >60 mL/min (ref >60)
GFR calc non Af Amer: >60 mL/min (ref >60)
Glucose, Bld: 134 mg/dL — ABNORMAL HIGH (ref 65–99)
POTASSIUM: 3.7 mmol/L (ref 3.5–5.1)
SODIUM: 137 mmol/L (ref 135–145)

## 2016-03-11 LAB — CBC
HEMATOCRIT: 35.3 % — AB (ref 36.0–46.0)
HEMOGLOBIN: 11.4 g/dL — AB (ref 12.0–15.0)
MCH: 28.8 pg (ref 26.0–34.0)
MCHC: 32.3 g/dL (ref 30.0–36.0)
MCV: 89.1 fL (ref 78.0–100.0)
Platelets: 294 10*3/uL (ref 150–400)
RBC: 3.96 MIL/uL (ref 3.87–5.11)
RDW: 14.2 % (ref 11.5–15.5)
WBC: 13.6 10*3/uL — ABNORMAL HIGH (ref 4.0–10.5)

## 2016-03-11 MED ORDER — METHOCARBAMOL 500 MG PO TABS: 500.0000 mg | ORAL_TABLET | Freq: Four times a day (QID) | ORAL | 0 refills | Status: DC | PRN

## 2016-03-11 MED ORDER — ASPIRIN 325 MG PO TBEC: 325.0000 mg | DELAYED_RELEASE_TABLET | Freq: Two times a day (BID) | ORAL | 0 refills | Status: DC

## 2016-03-11 MED ORDER — OXYCODONE HCL 5 MG PO TABS: 5.0000 mg | ORAL_TABLET | ORAL | 0 refills | Status: DC | PRN

## 2016-03-11 NOTE — Progress Notes (Signed)
Assumed care for patient around 12:30am. Patient was sleep when I made rounds. Patient woke up around 4 am needing to use restroom. Tech helped patient to [facility]. Patient IV was beeping and tech called me in. I went in to stop the patient IV and patient was crying about pain. I got patient the pain medication that was due. Patient was very hysterical for no apparent reason. Called charge nurse to help calm patient down.

## 2016-03-11 NOTE — Evaluation (Signed)
Occupational Therapy Evaluation and Discharge Patient Details Name: Susan Roberson MRN: [id number] DOB: [birth year] Today's Date: 03/11/2016    History of Present Illness pt presents with R TKR.  pt with hx of HTN and R THR.   Clinical Impression   Pt requiring min assist for  LB bathing and dressing and min guard assist for ADL transfers. Educated pt in use of AE for LB bathing and dressing, shower transfers, safe footwear and transporting items safely with RW. Pt will have assist of her supportive husband upon discharge. No further questions, pt eager to go home today.    Follow Up Recommendations  No OT follow up    Equipment Recommendations  None recommended by OT    Recommendations for Other Services       Precautions / Restrictions Precautions Precautions: Knee;Fall Precaution Comments: instructed to avoid pillow behind knee Restrictions Weight Bearing Restrictions: Yes RLE Weight Bearing: Weight bearing as tolerated      Mobility Bed Mobility      General bed mobility comments: pt in chair  Transfers Overall transfer level: Needs assistance Equipment used: Rolling walker (2 wheeled) Transfers: Sit to/from Stand Sit to Stand: Min guard         General transfer comment: cues for UE use and positioning of LEs.      Balance Overall balance assessment: Needs assistance Sitting-balance support: No upper extremity supported;Feet supported Sitting balance-Leahy Scale: Good     Standing balance support: Bilateral upper extremity supported;During functional activity Standing balance-Leahy Scale: Fair                              ADL Overall ADL's : Needs assistance/impaired Eating/Feeding: Independent;Sitting   Grooming: Wash/dry hands;Standing;Supervision/safety   Upper Body Bathing: Set up;Sitting   Lower Body Bathing: Minimal assistance;Sit to/from stand Lower Body Bathing Details (indicate cue type and reason): recommended pt use her long  handled bath sponge to wash her R foot and reacher to dry Upper Body Dressing : Set up;Sitting   Lower Body Dressing: Minimal assistance;Sit to/from stand Lower Body Dressing Details (indicate cue type and reason): instructed in compensatory strategies and reacher use, safe footwear, husband will help with sock and tying shoes Toilet Transfer: Min guard;RW;Ambulation   Toileting- Clothing Manipulation and Hygiene: Supervision/safety;Sit to/from Nurse, children's Details (indicate cue type and reason): verbally instructed in technique, pt has a built in seat, grab bars and hand held shower head and husband will supervise   General ADL Comments: instructed in transporting items with walker     Vision     Perception     Praxis      Pertinent Vitals/Pain Pain Assessment: 0-10 Pain Score: 6  Pain Location: R knee Pain Descriptors / Indicators: Aching Pain Intervention(s): Monitored during session;Repositioned;Patient requesting pain meds-RN notified;Ice applied     Hand Dominance Right   Extremity/Trunk Assessment Upper Extremity Assessment Upper Extremity Assessment: Overall WFL for tasks assessed   Lower Extremity Assessment Lower Extremity Assessment: Defer to PT evaluation RLE Deficits / Details: pt indicates very painful, but able to perform AAROM ~10 - 45 RLE Coordination: decreased fine motor;decreased gross motor   Cervical / Trunk Assessment Cervical / Trunk Assessment: Normal   Communication Communication Communication: No difficulties   Cognition Arousal/Alertness: Awake/alert Behavior During Therapy: WFL for tasks assessed/performed Overall Cognitive Status: Within Functional Limits for tasks assessed  General Comments       Exercises      Shoulder Instructions      Home Living Family/patient expects to be discharged to:: Private residence Living Arrangements: Spouse/significant other Available Help at  Discharge: Family;Available 24 hours/day Type of Home: House Home Access: Stairs to enter [employer] of Steps: 1 Entrance Stairs-Rails: None Home Layout: One level     Bathroom Shower/Tub: Occupational psychologist: Standard     Home Equipment: Environmental consultant - 4 wheels;Shower seat - built in;Grab bars - tub/shower;Hand held shower head (plans to borrow a 3 in 1)          Prior Functioning/Environment Level of Independence: Independent             OT Diagnosis: Generalized weakness;Acute pain   OT Problem List:     OT Treatment/Interventions:      OT Goals(Current goals can be found in the care plan section) Acute Rehab OT Goals Patient Stated Goal: Home today  OT Frequency:     Barriers to D/C:            Co-evaluation              End of Session Equipment Utilized During Treatment: Gait belt;Rolling walker CPM Right Knee CPM Right Knee: Off Nurse Communication: Patient requests pain meds  Activity Tolerance: Patient limited by pain Patient left: in chair;with call bell/phone within reach;with nursing/sitter in room;with family/visitor present   Time: [id number] OT Time Calculation (min): 22 min Charges:  OT General Charges $OT Visit: 1 Procedure OT Evaluation $OT Eval Low Complexity: 1 Procedure G-Codes:    Malka So 03/11/2016, 10:22 AM  [phone]

## 2016-03-11 NOTE — Progress Notes (Signed)
Physical Therapy Treatment Patient Details Name: Susan Roberson MRN: [id number] DOB: March 24, 1951 Today's Date: 03/11/2016    History of Present Illness pt presents with R TKR.  pt with hx of HTN and R THR.    PT Comments    Pt very motivated to D/C today.  Pt able to perform stair gait with RW and increase ambulation distance this session.  Pt also indicated she performed her exercises independently.  Pt is ready for D/C to home from PT perspective.    Follow Up Recommendations  Home health PT;Supervision - Intermittent     Equipment Recommendations  None recommended by PT    Recommendations for Other Services       Precautions / Restrictions Precautions Precautions: Knee;Fall Precaution Comments: instructed to avoid pillow behind knee Restrictions Weight Bearing Restrictions: Yes RLE Weight Bearing: Weight bearing as tolerated    Mobility  Bed Mobility               General bed mobility comments: pt in chair  Transfers Overall transfer level: Needs assistance Equipment used: Rolling walker (2 wheeled) Transfers: Sit to/from Stand Sit to Stand: Supervision         General transfer comment: pt demonstrates good return on education from this am.    Ambulation/Gait Ambulation/Gait assistance: Min guard Ambulation Distance (Feet): 200 Feet Assistive device: Rolling walker (2 wheeled) Gait Pattern/deviations: Step-to pattern;Step-through pattern;Decreased step length - left;Decreased stance time - right     General Gait Details: pt attempting step-through gait pattern, though due to pain was unable to perform for more than 2-3 steps.     Stairs Stairs: Yes Stairs assistance: Min guard Stair Management: No rails;Step to pattern;Forwards;With walker;Backwards Number of Stairs: 1 (x3) General stair comments: pt ed on stair technique for single step with RW.  pt ascended step both forwards and backwards.    Wheelchair Mobility    Modified Rankin (Stroke  Patients Only)       Balance Overall balance assessment: Needs assistance Sitting-balance support: No upper extremity supported;Feet supported Sitting balance-Leahy Scale: Good     Standing balance support: Single extremity supported;Bilateral upper extremity supported;During functional activity Standing balance-Leahy Scale: Fair                      Cognition Arousal/Alertness: Awake/alert Behavior During Therapy: WFL for tasks assessed/performed Overall Cognitive Status: Within Functional Limits for tasks assessed                      Exercises      General Comments        Pertinent Vitals/Pain Pain Assessment: 0-10 Pain Score: 4  Pain Location: R knee Pain Descriptors / Indicators: Aching Pain Intervention(s): Monitored during session;Premedicated before session;Repositioned    Home Living Family/patient expects to be discharged to:: Private residence Living Arrangements: Spouse/significant other Available Help at Discharge: Family;Available 24 hours/day Type of Home: House Home Access: Stairs to enter Entrance Stairs-Rails: None Home Layout: One level Home Equipment: Environmental consultant - 4 wheels;Shower seat - built in;Grab bars - tub/shower;Hand held shower head (plans to borrow a 3 in 1)      Prior Function Level of Independence: Independent          PT Goals (current goals can now be found in the care plan section) Acute Rehab PT Goals Patient Stated Goal: Home today PT Goal Formulation: With patient Time For Goal Achievement: 03/18/16 Potential to Achieve Goals: Good Progress towards PT goals: Progressing toward  goals    Frequency  7X/week    PT Plan Current plan remains appropriate    Co-evaluation             End of Session Equipment Utilized During Treatment: Gait belt Activity Tolerance: Patient tolerated treatment well Patient left: in chair;with call bell/phone within reach;with family/visitor present     Time:  [id number] PT Time Calculation (min) (ACUTE ONLY): 19 min  Charges:  $Gait Training: 8-22 mins                    G CodesCatarina Hartshorn, [place] 03/11/2016, 1:30 PM

## 2016-03-11 NOTE — Progress Notes (Signed)
Orthopedic Tech Progress Note Patient Details:  Susan Roberson 11/10/1950 [id number]  Patient ID: [name], female   DOB: November 26, 1950, 65 y.o.   MRN: [id number] Came to put on cpm. Pt was having pain issues and getting pain meds. Will call when ready to get in cpm.  Karolee Stamps 03/11/2016, 6:13 AM

## 2016-03-11 NOTE — Progress Notes (Signed)
SPORTS MEDICINE AND JOINT REPLACEMENT  Lara Mulch, MD    Carlyon Shadow, PA-C [place], [place], [place]  [address]                             [phone]   PROGRESS NOTE  Subjective:  negative for Chest Pain  negative for Shortness of Breath  negative for Nausea/Vomiting   negative for Calf Pain  negative for Bowel Movement   Tolerating Diet: yes         Patient reports pain as 7 on 0-10 scale.    Objective: Vital signs in last 24 hours:   Patient Vitals for the past 24 hrs:  BP Temp Temp src Pulse Resp SpO2 Height Weight  03/11/16 0359 118/61 98.1 F (36.7 C) Oral 72 18 100 % - -  03/10/16 2335 122/66 98.1 F (36.7 C) Oral 70 15 100 % - -  03/10/16 2001 124/64 97.9 F (36.6 C) Oral 62 18 100 % - -  03/10/16 1500 (!) 152/68 97.5 F (36.4 C) Oral 60 19 100 % - -  03/10/16 1318 - 98.2 F (36.8 C) - - - - - -  03/10/16 1315 - - - 61 19 100 % - -  03/10/16 1312 136/68 - - 64 19 100 % - -  03/10/16 1300 - - - (!) 55 13 98 % - -  03/10/16 1256 140/73 - - 62 14 100 % - -  03/10/16 1245 - - - (!) 55 11 100 % - -  03/10/16 1241 126/72 - - (!) 54 20 100 % - -  03/10/16 1230 - - - (!) 53 (!) 33 100 % - -  03/10/16 1226 132/73 - - (!) 54 15 100 % - -  03/10/16 1215 - - - (!) 57 (!) 21 100 % - -  03/10/16 1211 123/72 - - (!) 57 (!) 22 100 % - -  03/10/16 1200 - - - (!) 57 (!) 9 100 % - -  03/10/16 1157 121/69 97.4 F (36.3 C) - (!) 56 12 100 % - -  03/10/16 0721 (!) 151/75 97.8 F (36.6 C) - 68 18 98 % 5' 10.5" (1.791 m) 80.3 kg (177 lb 1.6 oz)    @flow {1959:LAST@   Intake/Output from previous day:   07/24 0701 - 07/25 0700 In: 2171.3 [P.O.:480; I.V.:1641.3] Out: 335 [Urine:325]   Intake/Output this shift:   No intake/output data recorded.   Intake/Output      07/24 0701 - 07/25 0700 07/25 0701 - 07/26 0700   P.O. 480    I.V. (mL/kg) 1641.3 (20.4)    IV Piggyback 50    Total Intake(mL/kg) 2171.3 (27)    Urine (mL/kg/hr) 325 (0.2)    Stool 0 (0)    Blood 10 (0)    Total Output 335     Net +1836.3          Urine Occurrence 6 x    Stool Occurrence 1 x       LABORATORY DATA: No results for input(s): WBC, HGB, HCT, PLT in the last 168 hours. No results for input(s): NA, K, CL, CO2, BUN, CREATININE, GLUCOSE, CALCIUM in the last 168 hours. Lab Results  Component Value Date   INR 0.91 02/28/2016    Examination:  General appearance: alert, cooperative and no distress Extremities: extremities normal, atraumatic, no cyanosis or edema  Wound Exam: clean, dry, intact   Drainage:  None: wound tissue dry  Motor Exam: Quadriceps and Hamstrings Intact  Sensory Exam: Superficial Peroneal, Deep Peroneal and Tibial normal   Assessment:    1 Day Post-Op  Procedure(s) (LRB): RIGHT TOTAL KNEE ARTHROPLASTY (Right)  ADDITIONAL DIAGNOSIS:  Active Problems:   S/P total knee replacement  Acute Blood Loss Anemia   Plan: Physical Therapy as ordered Weight Bearing as Tolerated (WBAT)  DVT Prophylaxis:  Aspirin  DISCHARGE PLAN: Home  DISCHARGE NEEDS: HHPT   Patient had a rough night but is doing much better. Will monitor progression in PT today for potential D/C this afternoon.          Donia Ast 03/11/2016, 7:14 AM

## 2016-03-11 NOTE — Discharge Summary (Signed)
SPORTS MEDICINE & JOINT REPLACEMENT   Lara Mulch, MD   Carlyon Shadow, PA-C [place], [place], [place]  [address]                             [phone]  PATIENT ID: Susan Roberson        MRN:  [id number]          DOB/AGE: Dec 21, 1950 / 65 y.o.    DISCHARGE SUMMARY  ADMISSION DATE:    03/10/2016 DISCHARGE DATE:   03/11/2016   ADMISSION DIAGNOSIS: primary osteoarthritis right knee     DISCHARGE DIAGNOSIS:  primary osteoarthritis right knee     ADDITIONAL DIAGNOSIS: Active Problems:   S/P total knee replacement  Past Medical History:  Diagnosis Date  . Arthritis   . Cancer ([place])    pre cancerous skin cancer with multiple lesions removed  . Complication of anesthesia    broken teeth  . Hypertension     PROCEDURE: Procedure(s): RIGHT TOTAL KNEE ARTHROPLASTY on 03/10/2016  CONSULTS:    HISTORY:  See H&P in chart  HOSPITAL COURSE:  Susan Roberson is a 65 y.o. admitted on 03/10/2016 and found to have a diagnosis of primary osteoarthritis right knee .  After appropriate laboratory studies were obtained  they were taken to the operating room on 03/10/2016 and underwent Procedure(s): RIGHT TOTAL KNEE ARTHROPLASTY.   They were given perioperative antibiotics:  Anti-infectives    Start     Dose/Rate Route Frequency Ordered Stop   03/10/16 1600  ceFAZolin (ANCEF) IVPB 1 g/50 mL premix     1 g 100 mL/hr over 30 Minutes Intravenous Every 6 hours 03/10/16 1220 03/10/16 2148   03/10/16 0709  ceFAZolin (ANCEF) IVPB 2g/100 mL premix     2 g 200 mL/hr over 30 Minutes Intravenous On call to O.R. 03/10/16 [id number] 03/10/16 0950    .  Patient given tranexamic acid IV or topical and exparel intra-operatively.  Tolerated the procedure well.    POD# 1: Vital signs were stable.  Patient denied Chest pain, shortness of breath, or calf pain.  Patient was started on Lovenox 30 mg subcutaneously twice daily at 8am.  Consults to PT, OT, and care management were made.  The patient was weight  bearing as tolerated.  CPM was placed on the operative leg 0-90 degrees for 6-8 hours a day. When out of the CPM, patient was placed in the foam block to achieve full extension. Incentive spirometry was taught.  Dressing was changed.       POD #2, Continued  PT for ambulation and exercise program.  IV saline locked.  O2 discontinued.    The remainder of the hospital course was dedicated to ambulation and strengthening.   The patient was discharged on 1 Day Post-Op in  Good condition.  Blood products given:none  DIAGNOSTIC STUDIES: Recent vital signs: Patient Vitals for the past 24 hrs:  BP Temp Temp src Pulse Resp SpO2  03/11/16 0359 118/61 98.1 F (36.7 C) Oral 72 18 100 %  03/10/16 2335 122/66 98.1 F (36.7 C) Oral 70 15 100 %  03/10/16 2001 124/64 97.9 F (36.6 C) Oral 62 18 100 %       Recent laboratory studies:  Recent Labs  03/11/16 0925  WBC 13.6*  HGB 11.4*  HCT 35.3*  PLT 294    Recent Labs  03/11/16 0925  NA 137  K 3.7  CL 103  CO2  26  BUN 9  CREATININE 0.75  GLUCOSE 134*  CALCIUM 8.9   Lab Results  Component Value Date   INR 0.91 02/28/2016     Recent Radiographic Studies :  Dg Chest 2 View  Result Date: 02/28/2016 CLINICAL DATA:  Right knee osteoarthritis, preprocedural for total knee replacement. EXAM: CHEST  2 VIEW COMPARISON:  None. FINDINGS: The heart size and mediastinal contours are within normal limits. Both lungs are clear. The visualized skeletal structures are unremarkable. IMPRESSION: No active cardiopulmonary disease. Electronically Signed   By: Van Clines M.D.   On: 02/28/2016 16:09    DISCHARGE INSTRUCTIONS: Discharge Instructions    CPM    Complete by:  As directed   Continuous passive motion machine (CPM):      Use the CPM from 0 to 90 for 4-6 hours per day.      You may increase by 10 per day.  You may break it up into 2 or 3 sessions per day.      Use CPM for 2 weeks or until you are told to stop.   Call MD / Call 911     Complete by:  As directed   If you experience chest pain or shortness of breath, CALL 911 and be transported to the hospital emergency room.  If you develope a fever above 101 F, pus (white drainage) or increased drainage or redness at the wound, or calf pain, call your surgeon's office.   Constipation Prevention    Complete by:  As directed   Drink plenty of fluids.  Prune juice may be helpful.  You may use a stool softener, such as Colace (over the counter) 100 mg twice a day.  Use MiraLax (over the counter) for constipation as needed.   Diet - low sodium heart healthy    Complete by:  As directed   Discharge instructions    Complete by:  As directed   INSTRUCTIONS AFTER JOINT REPLACEMENT   Remove items at home which could result in a fall. This includes throw rugs or furniture in walking pathways ICE to the affected joint every three hours while awake for 30 minutes at a time, for at least the first 3-5 days, and then as needed for pain and swelling.  Continue to use ice for pain and swelling. You may notice swelling that will progress down to the foot and ankle.  This is normal after surgery.  Elevate your leg when you are not up walking on it.   Continue to use the breathing machine you got in the hospital (incentive spirometer) which will help keep your temperature down.  It is common for your temperature to cycle up and down following surgery, especially at night when you are not up moving around and exerting yourself.  The breathing machine keeps your lungs expanded and your temperature down.   DIET:  As you were doing prior to hospitalization, we recommend a well-balanced diet.  DRESSING / WOUND CARE / SHOWERING  Keep the surgical dressing until follow up.  The dressing is water proof, so you can shower without any extra covering.  IF THE DRESSING FALLS OFF or the wound gets wet inside, change the dressing with sterile gauze.  Please use good hand washing techniques before changing the  dressing.  Do not use any lotions or creams on the incision until instructed by your surgeon.    ACTIVITY  Increase activity slowly as tolerated, but follow the weight bearing instructions below.  No driving for 6 weeks or until further direction given by your physician.  You cannot drive while taking narcotics.  No lifting or carrying greater than 10 lbs. until further directed by your surgeon. Avoid periods of inactivity such as sitting longer than an hour when not asleep. This helps prevent blood clots.  You may return to work once you are authorized by your doctor.     WEIGHT BEARING   Weight bearing as tolerated with assist device (walker, cane, etc) as directed, use it as long as suggested by your surgeon or therapist, typically at least 4-6 weeks.   EXERCISES  Results after joint replacement surgery are often greatly improved when you follow the exercise, range of motion and muscle strengthening exercises prescribed by your doctor. Safety measures are also important to protect the joint from further injury. Any time any of these exercises cause you to have increased pain or swelling, decrease what you are doing until you are comfortable again and then slowly increase them. If you have problems or questions, call your caregiver or physical therapist for advice.   Rehabilitation is important following a joint replacement. After just a few days of immobilization, the muscles of the leg can become weakened and shrink (atrophy).  These exercises are designed to build up the tone and strength of the thigh and leg muscles and to improve motion. Often times heat used for twenty to thirty minutes before working out will loosen up your tissues and help with improving the range of motion but do not use heat for the first two weeks following surgery (sometimes heat can increase post-operative swelling).   These exercises can be done on a training (exercise) mat, on the floor, on a table or on a  bed. Use whatever works the best and is most comfortable for you.    Use music or television while you are exercising so that the exercises are a pleasant break in your day. This will make your life better with the exercises acting as a break in your routine that you can look forward to.   Perform all exercises about fifteen times, three times per day or as directed.  You should exercise both the operative leg and the other leg as well.   Exercises include:   Quad Sets - Tighten up the muscle on the front of the thigh (Quad) and hold for 5-10 seconds.   Straight Leg Raises - With your knee straight (if you were given a brace, keep it on), lift the leg to 60 degrees, hold for 3 seconds, and slowly lower the leg.  Perform this exercise against resistance later as your leg gets stronger.  Leg Slides: Lying on your back, slowly slide your foot toward your buttocks, bending your knee up off the floor (only go as far as is comfortable). Then slowly slide your foot back down until your leg is flat on the floor again.  Angel Wings: Lying on your back spread your legs to the side as far apart as you can without causing discomfort.  Hamstring Strength:  Lying on your back, push your heel against the floor with your leg straight by tightening up the muscles of your buttocks.  Repeat, but this time bend your knee to a comfortable angle, and push your heel against the floor.  You may put a pillow under the heel to make it more comfortable if necessary.   A rehabilitation program following joint replacement surgery can speed recovery and prevent re-injury in  the future due to weakened muscles. Contact your doctor or a physical therapist for more information on knee rehabilitation.    CONSTIPATION  Constipation is defined medically as fewer than three stools per week and severe constipation as less than one stool per week.  Even if you have a regular bowel pattern at home, your normal regimen is likely to be  disrupted due to multiple reasons following surgery.  Combination of anesthesia, postoperative narcotics, change in appetite and fluid intake all can affect your bowels.   YOU MUST use at least one of the following options; they are listed in order of increasing strength to get the job done.  They are all available over the counter, and you may need to use some, POSSIBLY even all of these options:    Drink plenty of fluids (prune juice may be helpful) and high fiber foods Colace 100 mg by mouth twice a day  Senokot for constipation as directed and as needed Dulcolax (bisacodyl), take with full glass of water  Miralax (polyethylene glycol) once or twice a day as needed.  If you have tried all these things and are unable to have a bowel movement in the first 3-4 days after surgery call either your surgeon or your primary doctor.    If you experience loose stools or diarrhea, hold the medications until you stool forms back up.  If your symptoms do not get better within 1 week or if they get worse, check with your doctor.  If you experience "the worst abdominal pain ever" or develop nausea or vomiting, please contact the office immediately for further recommendations for treatment.   ITCHING:  If you experience itching with your medications, try taking only a single pain pill, or even half a pain pill at a time.  You can also use Benadryl over the counter for itching or also to help with sleep.   TED HOSE STOCKINGS:  Use stockings on both legs until for at least 2 weeks or as directed by physician office. They may be removed at night for sleeping.  MEDICATIONS:  See your medication summary on the "After Visit Summary" that nursing will review with you.  You may have some home medications which will be placed on hold until you complete the course of blood thinner medication.  It is important for you to complete the blood thinner medication as prescribed.  PRECAUTIONS:  If you experience chest pain or  shortness of breath - call 911 immediately for transfer to the hospital emergency department.   If you develop a fever greater that 101 F, purulent drainage from wound, increased redness or drainage from wound, foul odor from the wound/dressing, or calf pain - CONTACT YOUR SURGEON.                                                   FOLLOW-UP APPOINTMENTS:  If you do not already have a post-op appointment, please call the office for an appointment to be seen by your surgeon.  Guidelines for how soon to be seen are listed in your "After Visit Summary", but are typically between 1-4 weeks after surgery.  OTHER INSTRUCTIONS:   Knee Replacement:  Do not place pillow under knee, focus on keeping the knee straight while resting. CPM instructions: 0-90 degrees, 2 hours in the morning, 2 hours in the afternoon,  and 2 hours in the evening. Place foam block, curve side up under heel at all times except when in CPM or when walking.  DO NOT modify, tear, cut, or change the foam block in any way.  MAKE SURE YOU:  Understand these instructions.  Get help right away if you are not doing well or get worse.    Thank you for letting us be a part of your medical care team.  It is a privilege we respect greatly.  We hope these instructions will help you stay on track for a fast and full recovery!   Driving restrictions    Complete by:  As directed   No driving for 4 weeks   Increase activity slowly as tolerated    Complete by:  As directed      DISCHARGE MEDICATIONS:     Medication List    STOP taking these medications   celecoxib 100 MG capsule Commonly known as:  CELEBREX     TAKE these medications   acetaminophen 500 MG tablet Commonly known as:  TYLENOL Take 1,000 mg by mouth every 6 (six) hours as needed.   aspirin 325 MG EC tablet Take 1 tablet (325 mg total) by mouth every 12 (twelve) hours.   LORazepam 0.5 MG tablet Commonly known as:  ATIVAN Take 0.5 mg by mouth 3 (three) times daily as  needed for anxiety.   methocarbamol 500 MG tablet Commonly known as:  ROBAXIN Take 1-2 tablets (500-1,000 mg total) by mouth every 6 (six) hours as needed for muscle spasms.   olmesartan 20 MG tablet Commonly known as:  BENICAR Take 20 mg by mouth daily.   oxyCODONE 5 MG immediate release tablet Commonly known as:  Oxy IR/ROXICODONE Take 1-2 tablets (5-10 mg total) by mouth every 3 (three) hours as needed for breakthrough pain.   traMADol 50 MG tablet Commonly known as:  ULTRAM Take 50 mg by mouth every 6 (six) hours as needed for moderate pain.       FOLLOW UP VISIT:   Follow-up Information    [facility] .   Why:  Someone from [place] at [employer]) will contact you to arrange start date and time for therapy. Contact information: [address] [address] [phone]           DISPOSITION: HOME VS. SNF  CONDITION:  Good   Donia Ast 03/11/2016, 3:10 PM

## 2016-03-11 NOTE — Op Note (Signed)
TOTAL KNEE REPLACEMENT OPERATIVE NOTE:  03/10/2016  10:10 AM  PATIENT:  Susan Roberson  65 y.o. female  PRE-OPERATIVE DIAGNOSIS:  primary osteoarthritis right knee   POST-OPERATIVE DIAGNOSIS:  primary osteoarthritis right knee   PROCEDURE:  Procedure(s): RIGHT TOTAL KNEE ARTHROPLASTY  SURGEON:  Surgeon(s): Vickey Huger, MD  PHYSICIAN ASSISTANT: Carlyon Shadow, [facility]   ANESTHESIA:   spinal  DRAINS: Hemovac  SPECIMEN: None  COUNTS:  Correct  TOURNIQUET:  * Missing tourniquet times found for documented tourniquets in log:  [id number] *  DICTATION:  Indication for procedure:    The patient is a 65 y.o. female who has failed conservative treatment for primary osteoarthritis right knee .  Informed consent was obtained prior to anesthesia. The risks versus benefits of the operation were explain and in a way the patient can, and did, understand.   On the implant demand matching protocol, this patient scored 10.  Therefore, this patient did" "did not receive a polyethylene insert with vitamin E which is a high demand implant.  Description of procedure:     The patient was taken to the operating room and placed under anesthesia.  The patient was positioned in the usual fashion taking care that all body parts were adequately padded and/or protected.  I foley catheter was not placed.  A tourniquet was applied and the leg prepped and draped in the usual sterile fashion.  The extremity was exsanguinated with the esmarch and tourniquet inflated to 350 mmHg.  Pre-operative range of motion was normal.  The knee was in 5 degree of mild valgus.  A midline incision approximately 6-7 inches long was made with a #10 blade.  A new blade was used to make a parapatellar arthrotomy going 2-3 cm into the quadriceps tendon, over the patella, and alongside the medial aspect of the patellar tendon.  A synovectomy was then performed with the #10 blade and forceps. I then elevated the deep MCL off the medial tibial  metaphysis subperiosteally around to the semimembranosus attachment.    I everted the patella and used calipers to measure patellar thickness.  I used the reamer to ream down to appropriate thickness to recreate the native thickness.  I then removed excess bone with the rongeur and sagittal saw.  I used the appropriately sized template and drilled the three lug holes.  I then put the trial in place and measured the thickness with the calipers to ensure recreation of the native thickness.  The trial was then removed and the patella subluxed and the knee brought into flexion.  A homan retractor was place to retract and protect the patella and lateral structures.  A Z-retractor was place medially to protect the medial structures.  The extra-medullary alignment system was used to make cut the tibial articular surface perpendicular to the anamotic axis of the tibia and in 3 degrees of posterior slope.  The cut surface and alignment jig was removed.  I then used the intramedullary alignment guide to make a 4 valgus cut on the distal femur.  I then marked out the epicondylar axis on the distal femur.  The posterior condylar axis measured 5 degrees.  I then used the anterior referencing sizer and measured the femur to be a size 8.  The 4-In-1 cutting block was screwed into place in external rotation matching the posterior condylar angle, making our cuts perpendicular to the epicondylar axis.  Anterior, posterior and chamfer cuts were made with the sagittal saw.  The cutting block and cut pieces  were removed.  A lamina spreader was placed in 90 degrees of flexion.  The ACL, PCL, menisci, and posterior condylar osteophytes were removed.  A 10 mm spacer blocked was found to offer good flexion and extension gap balance after moderate in degree releasing.   The scoop retractor was then placed and the femoral finishing block was pinned in place.  The small sagittal saw was used as well as the lug drill to finish the  femur.  The block and cut surfaces were removed and the medullary canal hole filled with autograft bone from the cut pieces.  The tibia was delivered forward in deep flexion and external rotation.  A size E tray was selected and pinned into place centered on the medial 1/3 of the tibial tubercle.  The reamer and keel was used to prepare the tibia through the tray.    I then trialed with the size 8 femur, size E tibia, a 10 mm insert and the 35 patella.  I had excellent flexion/extension gap balance, excellent patella tracking.  Flexion was full and beyond 120 degrees; extension was zero.  These components were chosen and the staff opened them to me on the back table while the knee was lavaged copiously and the cement mixed.  The soft tissue was infiltrated with 60cc of exparel 1.3% through a 21 gauge needle.  I cemented in the components and removed all excess cement.  The polyethylene tibial component was snapped into place and the knee placed in extension while cement was hardening.  The capsule was infilltrated with 30cc of .25% Marcaine with epinephrine.  A hemovac was place in the joint exiting superolaterally.  A pain pump was place superomedially superficial to the arthrotomy.  Once the cement was hard, the tourniquet was let down.  Hemostasis was obtained.  The arthrotomy was closed with figure-8 #1 vicryl sutures.  The deep soft tissues were closed with #0 vicryls and the subcuticular layer closed with a running #2-0 vicryl.  The skin was reapproximated and closed with skin staples.  The wound was dressed with xeroform, 4 x4's, 2 ABD sponges, a single layer of webril and a TED stocking.   The patient was then awakened, extubated, and taken to the recovery room in stable condition.  BLOOD LOSS:  300cc DRAINS: 1 hemovac, 1 pain catheter COMPLICATIONS:  None.  PLAN OF CARE: Admit to inpatient   PATIENT DISPOSITION:  PACU - hemodynamically stable.   Delay start of Pharmacological VTE agent  (>24hrs) due to surgical blood loss or risk of bleeding:  not applicable  Please fax a copy of this op note to my office at [phone] (please only include page 1 and 2 of the Case Information op note)

## 2016-03-11 NOTE — Evaluation (Signed)
Physical Therapy Evaluation Patient Details Name: Susan Roberson MRN: [id number] DOB: 1950/10/30 Today's Date: 03/11/2016   History of Present Illness  pt presents with R TKR.  pt with hx of HTN and R THR.  Clinical Impression  Pt very motivated for D/C to home today.  Pt seems to have better pain control than yesterday and able to ambulate into hallway this am.  Will see pt for BID session and anticipate will be ready for D/C at that time.      Follow Up Recommendations Home health PT;Supervision - Intermittent    Equipment Recommendations  3in1 (PT) (pt to try to borrow 3-in-1, but may need one.)    Recommendations for Other Services       Precautions / Restrictions Precautions Precautions: Fall Restrictions Weight Bearing Restrictions: Yes RLE Weight Bearing: Weight bearing as tolerated      Mobility  Bed Mobility Overal bed mobility: Needs Assistance Bed Mobility: Supine to Sit     Supine to sit: Min assist     General bed mobility comments: A with R LE only.  pt able to bring self to sitting with HOB flat.    Transfers Overall transfer level: Needs assistance   Transfers: Sit to/from Stand Sit to Stand: Min guard         General transfer comment: cues for UE use and positioning of LEs.    Ambulation/Gait Ambulation/Gait assistance: Min guard Ambulation Distance (Feet): 120 Feet Assistive device: Rolling walker (2 wheeled) Gait Pattern/deviations: Step-to pattern;Decreased step length - left;Decreased stance time - right;Decreased stride length     General Gait Details: pt able to WB through R LE without difficulty.  cues for positioning within RW.    Stairs            Wheelchair Mobility    Modified Rankin (Stroke Patients Only)       Balance Overall balance assessment: Needs assistance Sitting-balance support: No upper extremity supported;Feet supported Sitting balance-Leahy Scale: Good     Standing balance support: Bilateral upper  extremity supported;During functional activity Standing balance-Leahy Scale: Fair                               Pertinent Vitals/Pain Pain Assessment: 0-10 Pain Score: 5  Pain Location: R knee Pain Descriptors / Indicators: Aching;Grimacing;Guarding Pain Intervention(s): Monitored during session;Premedicated before session;Repositioned    Home Living Family/patient expects to be discharged to:: Private residence Living Arrangements: Spouse/significant other Available Help at Discharge: Family;Available 24 hours/day Type of Home: House Home Access: Stairs to enter Entrance Stairs-Rails: None Entrance Stairs-Number of Steps: 1 Home Layout: One level Home Equipment: Walker - 4 wheels (pt to try to borrow 3-in-1.)      Prior Function Level of Independence: Independent               Hand Dominance        Extremity/Trunk Assessment   Upper Extremity Assessment: Defer to OT evaluation           Lower Extremity Assessment: RLE deficits/detail RLE Deficits / Details: pt indicates very painful, but able to perform AAROM ~10 - 45    Cervical / Trunk Assessment: Normal  Communication   Communication: No difficulties  Cognition Arousal/Alertness: Awake/alert Behavior During Therapy: WFL for tasks assessed/performed Overall Cognitive Status: Within Functional Limits for tasks assessed  General Comments      Exercises Total Joint Exercises Ankle Circles/Pumps: AROM;Both;10 reps Quad Sets: AROM;Right;10 reps Heel Slides: AROM;Right;10 reps Hip ABduction/ADduction: AAROM;Right;10 reps Long Arc Quad: AROM;Right;10 reps      Assessment/Plan    PT Assessment Patient needs continued PT services  PT Diagnosis Abnormality of gait;Acute pain   PT Problem List Decreased strength;Decreased range of motion;Decreased activity tolerance;Decreased balance;Decreased mobility;Decreased knowledge of use of DME;Pain  PT Treatment  Interventions DME instruction;Gait training;Stair training;Functional mobility training;Therapeutic activities;Therapeutic exercise;Balance training;Patient/family education   PT Goals (Current goals can be found in the Care Plan section) Acute Rehab PT Goals Patient Stated Goal: Home today PT Goal Formulation: With patient Time For Goal Achievement: 03/18/16 Potential to Achieve Goals: Good    Frequency 7X/week   Barriers to discharge        Co-evaluation               End of Session Equipment Utilized During Treatment: Gait belt Activity Tolerance: Patient tolerated treatment well Patient left: in chair;with call bell/phone within reach;with family/visitor present Nurse Communication: Mobility status         Time: [id number] PT Time Calculation (min) (ACUTE ONLY): 27 min   Charges:   PT Evaluation $PT Eval Moderate Complexity: 1 Procedure PT Treatments $Gait Training: 8-22 mins   PT G CodesCatarina Hartshorn, [place] 03/11/2016, 9:28 AM

## 2016-03-11 NOTE — Care Management Note (Signed)
Case Management Note  Patient Details  Name: Susan Roberson MRN: [id number] Date of Birth: 1950/12/26  Subjective/Objective:  65 yr old female s/p right total knee arthroplasty.                  Action/Plan: Case manager spoke with patient and her husband concerning home health and DME needs. Patient was preoperatively setup with [facility], no changes. 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 She will have family support at discharge.   Expected Discharge Date:   03/12/16               Expected Discharge Plan:  [place]  In-House Referral:     Discharge planning Services  CM Consult  Post Acute Care Choice:  Home Health Choice offered to:  Patient  DME Arranged:  N/A DME Agency:     HH Arranged:  PT [place]:  [facility] (now [facility])  Status of Service:  Completed, signed off  If discussed at [place] of Stay Meetings, dates discussed:    Additional Comments:  Ninfa Meeker, RN 03/11/2016, 12:27 PM

## 2016-03-11 NOTE — Care Management Important Message (Signed)
Important Message  Patient Details  Name: Nema Aguillar MRN: [id number] Date of Birth: 1951/02/01   Medicare Important Message Given:  Yes    Loann Quill 03/11/2016, 9:47 AM

## 2016-03-11 NOTE — Progress Notes (Signed)
Patient discharged per orders. Prescriptions and discharge instructions given to patient. Patient's husband at bedside for d/c teaching/instructions. Prescriptions, medications, follow up appointments, wound care, home care, diet and activity discussed. Time allowed for questions and concerns. Patient denies having any questions or concerns. Patient left the unit via wheelchair for transport by her husband. Roselyn Reef Saory Carriero,RN

## 2016-03-12 DIAGNOSIS — Z471 Aftercare following joint replacement surgery: Secondary | ICD-10-CM | POA: Diagnosis not present

## 2016-03-12 DIAGNOSIS — Z96651 Presence of right artificial knee joint: Secondary | ICD-10-CM | POA: Diagnosis not present

## 2016-03-12 DIAGNOSIS — Z96641 Presence of right artificial hip joint: Secondary | ICD-10-CM | POA: Diagnosis not present

## 2016-03-13 DIAGNOSIS — Z471 Aftercare following joint replacement surgery: Secondary | ICD-10-CM | POA: Diagnosis not present

## 2016-03-13 DIAGNOSIS — Z96651 Presence of right artificial knee joint: Secondary | ICD-10-CM | POA: Diagnosis not present

## 2016-03-13 DIAGNOSIS — Z96641 Presence of right artificial hip joint: Secondary | ICD-10-CM | POA: Diagnosis not present

## 2016-03-14 DIAGNOSIS — Z96651 Presence of right artificial knee joint: Secondary | ICD-10-CM | POA: Diagnosis not present

## 2016-03-14 DIAGNOSIS — Z471 Aftercare following joint replacement surgery: Secondary | ICD-10-CM | POA: Diagnosis not present

## 2016-03-14 DIAGNOSIS — Z96641 Presence of right artificial hip joint: Secondary | ICD-10-CM | POA: Diagnosis not present

## 2016-03-15 DIAGNOSIS — Z471 Aftercare following joint replacement surgery: Secondary | ICD-10-CM | POA: Diagnosis not present

## 2016-03-15 DIAGNOSIS — Z96641 Presence of right artificial hip joint: Secondary | ICD-10-CM | POA: Diagnosis not present

## 2016-03-15 DIAGNOSIS — Z96651 Presence of right artificial knee joint: Secondary | ICD-10-CM | POA: Diagnosis not present

## 2016-03-17 DIAGNOSIS — Z96641 Presence of right artificial hip joint: Secondary | ICD-10-CM | POA: Diagnosis not present

## 2016-03-17 DIAGNOSIS — Z96651 Presence of right artificial knee joint: Secondary | ICD-10-CM | POA: Diagnosis not present

## 2016-03-17 DIAGNOSIS — Z471 Aftercare following joint replacement surgery: Secondary | ICD-10-CM | POA: Diagnosis not present

## 2016-03-19 DIAGNOSIS — Z96651 Presence of right artificial knee joint: Secondary | ICD-10-CM | POA: Diagnosis not present

## 2016-03-19 DIAGNOSIS — Z96641 Presence of right artificial hip joint: Secondary | ICD-10-CM | POA: Diagnosis not present

## 2016-03-19 DIAGNOSIS — Z471 Aftercare following joint replacement surgery: Secondary | ICD-10-CM | POA: Diagnosis not present

## 2016-03-21 DIAGNOSIS — Z96641 Presence of right artificial hip joint: Secondary | ICD-10-CM | POA: Diagnosis not present

## 2016-03-21 DIAGNOSIS — Z471 Aftercare following joint replacement surgery: Secondary | ICD-10-CM | POA: Diagnosis not present

## 2016-03-21 DIAGNOSIS — Z96651 Presence of right artificial knee joint: Secondary | ICD-10-CM | POA: Diagnosis not present

## 2016-03-24 DIAGNOSIS — Z471 Aftercare following joint replacement surgery: Secondary | ICD-10-CM | POA: Diagnosis not present

## 2016-03-24 DIAGNOSIS — Z96651 Presence of right artificial knee joint: Secondary | ICD-10-CM | POA: Diagnosis not present

## 2016-03-24 DIAGNOSIS — Z96641 Presence of right artificial hip joint: Secondary | ICD-10-CM | POA: Diagnosis not present

## 2016-03-25 DIAGNOSIS — Z471 Aftercare following joint replacement surgery: Secondary | ICD-10-CM | POA: Diagnosis not present

## 2016-03-25 DIAGNOSIS — Z96651 Presence of right artificial knee joint: Secondary | ICD-10-CM | POA: Diagnosis not present

## 2016-03-27 DIAGNOSIS — M62551 Muscle wasting and atrophy, not elsewhere classified, right thigh: Secondary | ICD-10-CM | POA: Diagnosis not present

## 2016-03-27 DIAGNOSIS — R2689 Other abnormalities of gait and mobility: Secondary | ICD-10-CM | POA: Diagnosis not present

## 2016-03-27 DIAGNOSIS — M25561 Pain in right knee: Secondary | ICD-10-CM | POA: Diagnosis not present

## 2016-03-27 DIAGNOSIS — Z96651 Presence of right artificial knee joint: Secondary | ICD-10-CM | POA: Diagnosis not present

## 2016-03-31 DIAGNOSIS — Z96651 Presence of right artificial knee joint: Secondary | ICD-10-CM | POA: Diagnosis not present

## 2016-03-31 DIAGNOSIS — R2689 Other abnormalities of gait and mobility: Secondary | ICD-10-CM | POA: Diagnosis not present

## 2016-03-31 DIAGNOSIS — M25561 Pain in right knee: Secondary | ICD-10-CM | POA: Diagnosis not present

## 2016-03-31 DIAGNOSIS — M62551 Muscle wasting and atrophy, not elsewhere classified, right thigh: Secondary | ICD-10-CM | POA: Diagnosis not present

## 2016-04-02 DIAGNOSIS — R2689 Other abnormalities of gait and mobility: Secondary | ICD-10-CM | POA: Diagnosis not present

## 2016-04-02 DIAGNOSIS — M25561 Pain in right knee: Secondary | ICD-10-CM | POA: Diagnosis not present

## 2016-04-02 DIAGNOSIS — M62551 Muscle wasting and atrophy, not elsewhere classified, right thigh: Secondary | ICD-10-CM | POA: Diagnosis not present

## 2016-04-02 DIAGNOSIS — Z96651 Presence of right artificial knee joint: Secondary | ICD-10-CM | POA: Diagnosis not present

## 2016-04-04 DIAGNOSIS — M62551 Muscle wasting and atrophy, not elsewhere classified, right thigh: Secondary | ICD-10-CM | POA: Diagnosis not present

## 2016-04-04 DIAGNOSIS — M25561 Pain in right knee: Secondary | ICD-10-CM | POA: Diagnosis not present

## 2016-04-04 DIAGNOSIS — R2689 Other abnormalities of gait and mobility: Secondary | ICD-10-CM | POA: Diagnosis not present

## 2016-04-04 DIAGNOSIS — Z96651 Presence of right artificial knee joint: Secondary | ICD-10-CM | POA: Diagnosis not present

## 2016-04-07 DIAGNOSIS — Z96651 Presence of right artificial knee joint: Secondary | ICD-10-CM | POA: Diagnosis not present

## 2016-04-07 DIAGNOSIS — R2689 Other abnormalities of gait and mobility: Secondary | ICD-10-CM | POA: Diagnosis not present

## 2016-04-07 DIAGNOSIS — M62551 Muscle wasting and atrophy, not elsewhere classified, right thigh: Secondary | ICD-10-CM | POA: Diagnosis not present

## 2016-04-07 DIAGNOSIS — M25561 Pain in right knee: Secondary | ICD-10-CM | POA: Diagnosis not present

## 2016-04-09 DIAGNOSIS — M25561 Pain in right knee: Secondary | ICD-10-CM | POA: Diagnosis not present

## 2016-04-09 DIAGNOSIS — M62551 Muscle wasting and atrophy, not elsewhere classified, right thigh: Secondary | ICD-10-CM | POA: Diagnosis not present

## 2016-04-09 DIAGNOSIS — R2689 Other abnormalities of gait and mobility: Secondary | ICD-10-CM | POA: Diagnosis not present

## 2016-04-09 DIAGNOSIS — Z96651 Presence of right artificial knee joint: Secondary | ICD-10-CM | POA: Diagnosis not present

## 2016-04-11 DIAGNOSIS — R2689 Other abnormalities of gait and mobility: Secondary | ICD-10-CM | POA: Diagnosis not present

## 2016-04-11 DIAGNOSIS — M62551 Muscle wasting and atrophy, not elsewhere classified, right thigh: Secondary | ICD-10-CM | POA: Diagnosis not present

## 2016-04-11 DIAGNOSIS — M25561 Pain in right knee: Secondary | ICD-10-CM | POA: Diagnosis not present

## 2016-04-11 DIAGNOSIS — Z96651 Presence of right artificial knee joint: Secondary | ICD-10-CM | POA: Diagnosis not present

## 2016-04-16 DIAGNOSIS — M25561 Pain in right knee: Secondary | ICD-10-CM | POA: Diagnosis not present

## 2016-04-16 DIAGNOSIS — Z96651 Presence of right artificial knee joint: Secondary | ICD-10-CM | POA: Diagnosis not present

## 2016-04-16 DIAGNOSIS — R2689 Other abnormalities of gait and mobility: Secondary | ICD-10-CM | POA: Diagnosis not present

## 2016-04-16 DIAGNOSIS — M62551 Muscle wasting and atrophy, not elsewhere classified, right thigh: Secondary | ICD-10-CM | POA: Diagnosis not present

## 2016-04-17 DIAGNOSIS — Z96651 Presence of right artificial knee joint: Secondary | ICD-10-CM | POA: Diagnosis not present

## 2016-04-17 DIAGNOSIS — R2689 Other abnormalities of gait and mobility: Secondary | ICD-10-CM | POA: Diagnosis not present

## 2016-04-17 DIAGNOSIS — M25561 Pain in right knee: Secondary | ICD-10-CM | POA: Diagnosis not present

## 2016-04-17 DIAGNOSIS — M62551 Muscle wasting and atrophy, not elsewhere classified, right thigh: Secondary | ICD-10-CM | POA: Diagnosis not present

## 2016-04-22 DIAGNOSIS — Z96651 Presence of right artificial knee joint: Secondary | ICD-10-CM | POA: Diagnosis not present

## 2016-04-22 DIAGNOSIS — R2689 Other abnormalities of gait and mobility: Secondary | ICD-10-CM | POA: Diagnosis not present

## 2016-04-22 DIAGNOSIS — M62551 Muscle wasting and atrophy, not elsewhere classified, right thigh: Secondary | ICD-10-CM | POA: Diagnosis not present

## 2016-04-22 DIAGNOSIS — M25561 Pain in right knee: Secondary | ICD-10-CM | POA: Diagnosis not present

## 2016-04-24 DIAGNOSIS — M25561 Pain in right knee: Secondary | ICD-10-CM | POA: Diagnosis not present

## 2016-04-24 DIAGNOSIS — Z96651 Presence of right artificial knee joint: Secondary | ICD-10-CM | POA: Diagnosis not present

## 2016-04-24 DIAGNOSIS — R2689 Other abnormalities of gait and mobility: Secondary | ICD-10-CM | POA: Diagnosis not present

## 2016-04-24 DIAGNOSIS — M62551 Muscle wasting and atrophy, not elsewhere classified, right thigh: Secondary | ICD-10-CM | POA: Diagnosis not present

## 2016-07-08 DIAGNOSIS — C44702 Unspecified malignant neoplasm of skin of right lower limb, including hip: Secondary | ICD-10-CM | POA: Diagnosis not present

## 2016-07-08 DIAGNOSIS — D485 Neoplasm of uncertain behavior of skin: Secondary | ICD-10-CM | POA: Diagnosis not present

## 2016-07-08 DIAGNOSIS — C44729 Squamous cell carcinoma of skin of left lower limb, including hip: Secondary | ICD-10-CM | POA: Diagnosis not present

## 2016-07-08 DIAGNOSIS — L814 Other melanin hyperpigmentation: Secondary | ICD-10-CM | POA: Diagnosis not present

## 2016-07-08 DIAGNOSIS — C44629 Squamous cell carcinoma of skin of left upper limb, including shoulder: Secondary | ICD-10-CM | POA: Diagnosis not present

## 2016-07-08 DIAGNOSIS — L570 Actinic keratosis: Secondary | ICD-10-CM | POA: Diagnosis not present

## 2016-07-08 DIAGNOSIS — Z85828 Personal history of other malignant neoplasm of skin: Secondary | ICD-10-CM | POA: Diagnosis not present

## 2016-07-08 DIAGNOSIS — L821 Other seborrheic keratosis: Secondary | ICD-10-CM | POA: Diagnosis not present

## 2016-08-13 DIAGNOSIS — C44629 Squamous cell carcinoma of skin of left upper limb, including shoulder: Secondary | ICD-10-CM | POA: Diagnosis not present

## 2016-08-13 DIAGNOSIS — C44722 Squamous cell carcinoma of skin of right lower limb, including hip: Secondary | ICD-10-CM | POA: Diagnosis not present

## 2016-09-11 DIAGNOSIS — C44729 Squamous cell carcinoma of skin of left lower limb, including hip: Secondary | ICD-10-CM | POA: Diagnosis not present

## 2016-09-11 DIAGNOSIS — D0472 Carcinoma in situ of skin of left lower limb, including hip: Secondary | ICD-10-CM | POA: Diagnosis not present

## 2017-04-01 IMAGING — CR DG CHEST 2V
2 series · 2 of 2 positions shown · non-contrast
Comparison: None.

CLINICAL DATA: Right knee osteoarthritis, preprocedural for total
knee replacement.

EXAM:
CHEST  2 VIEW

[w chest pa]
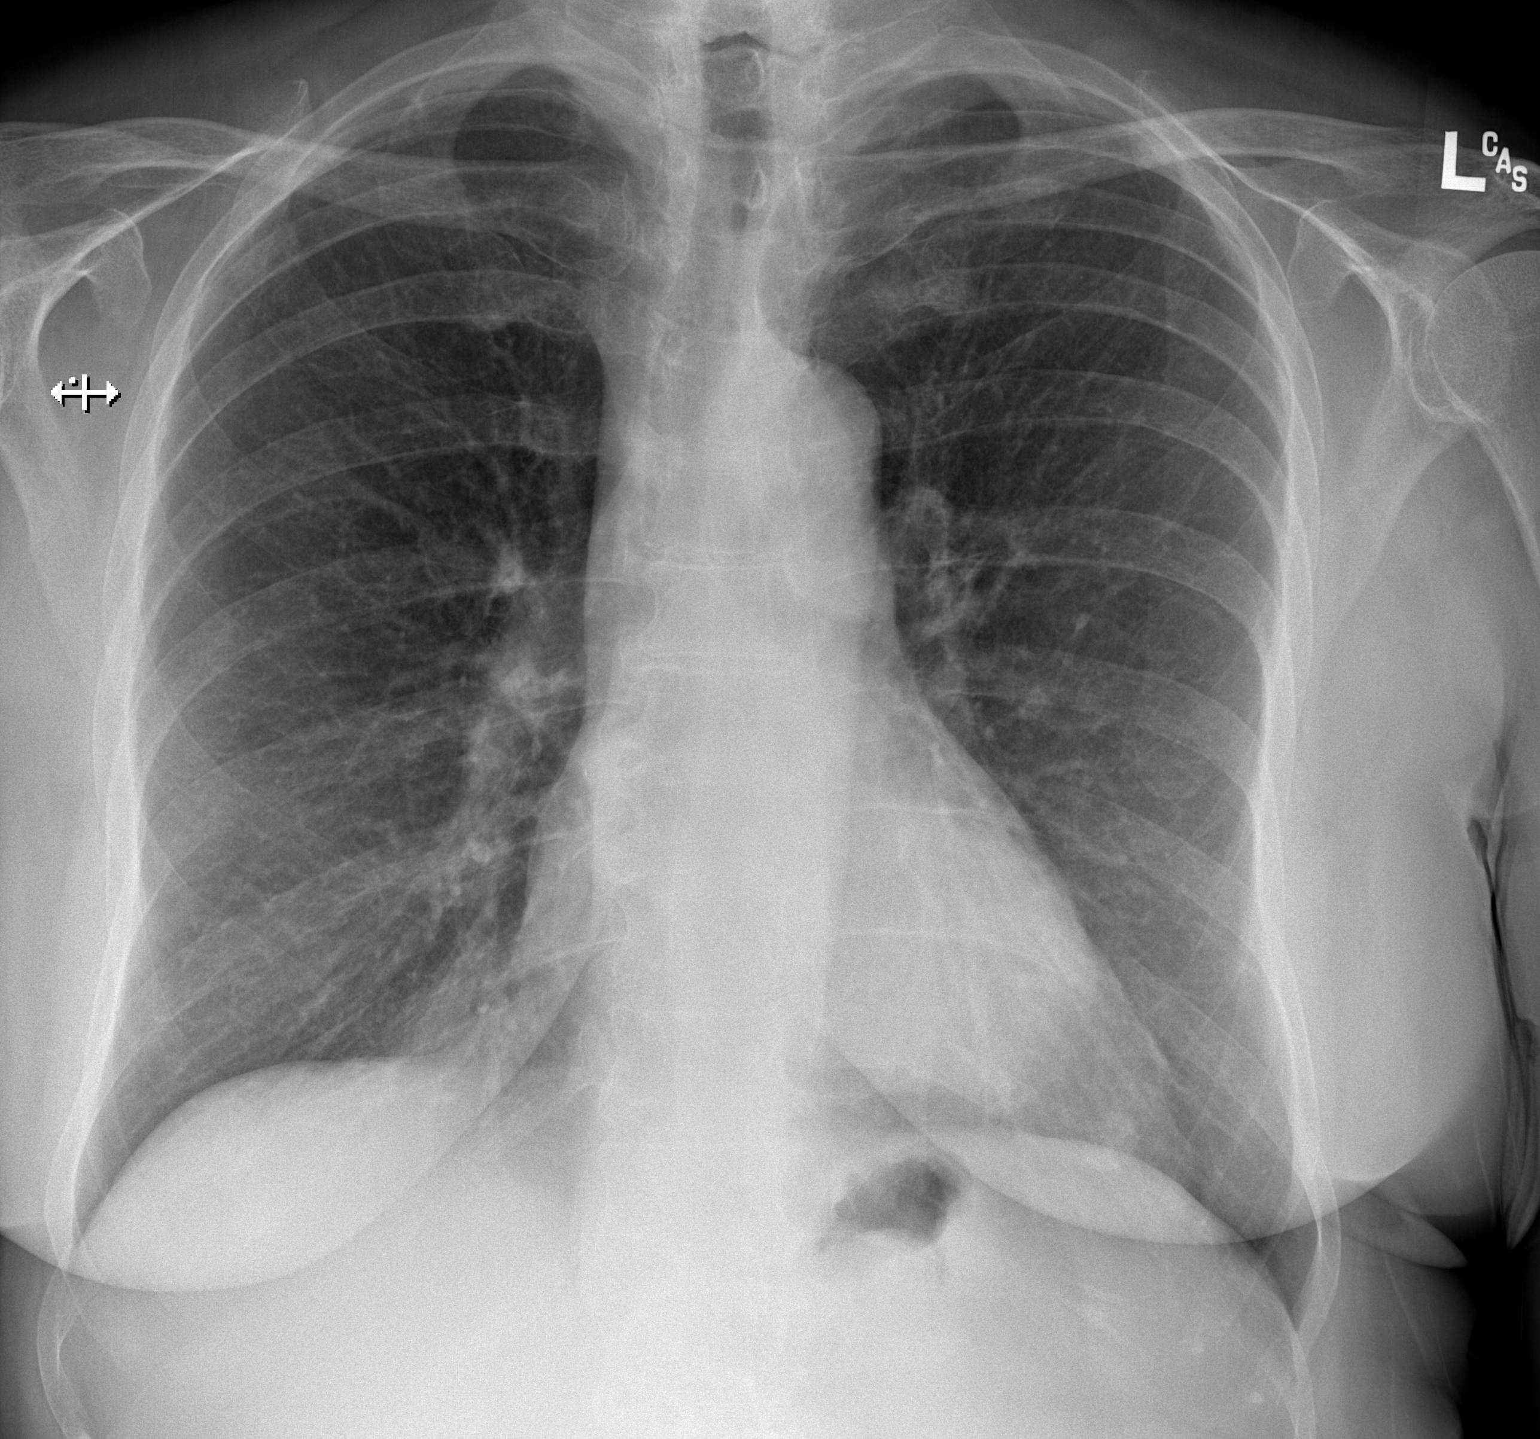

[w chest lat]
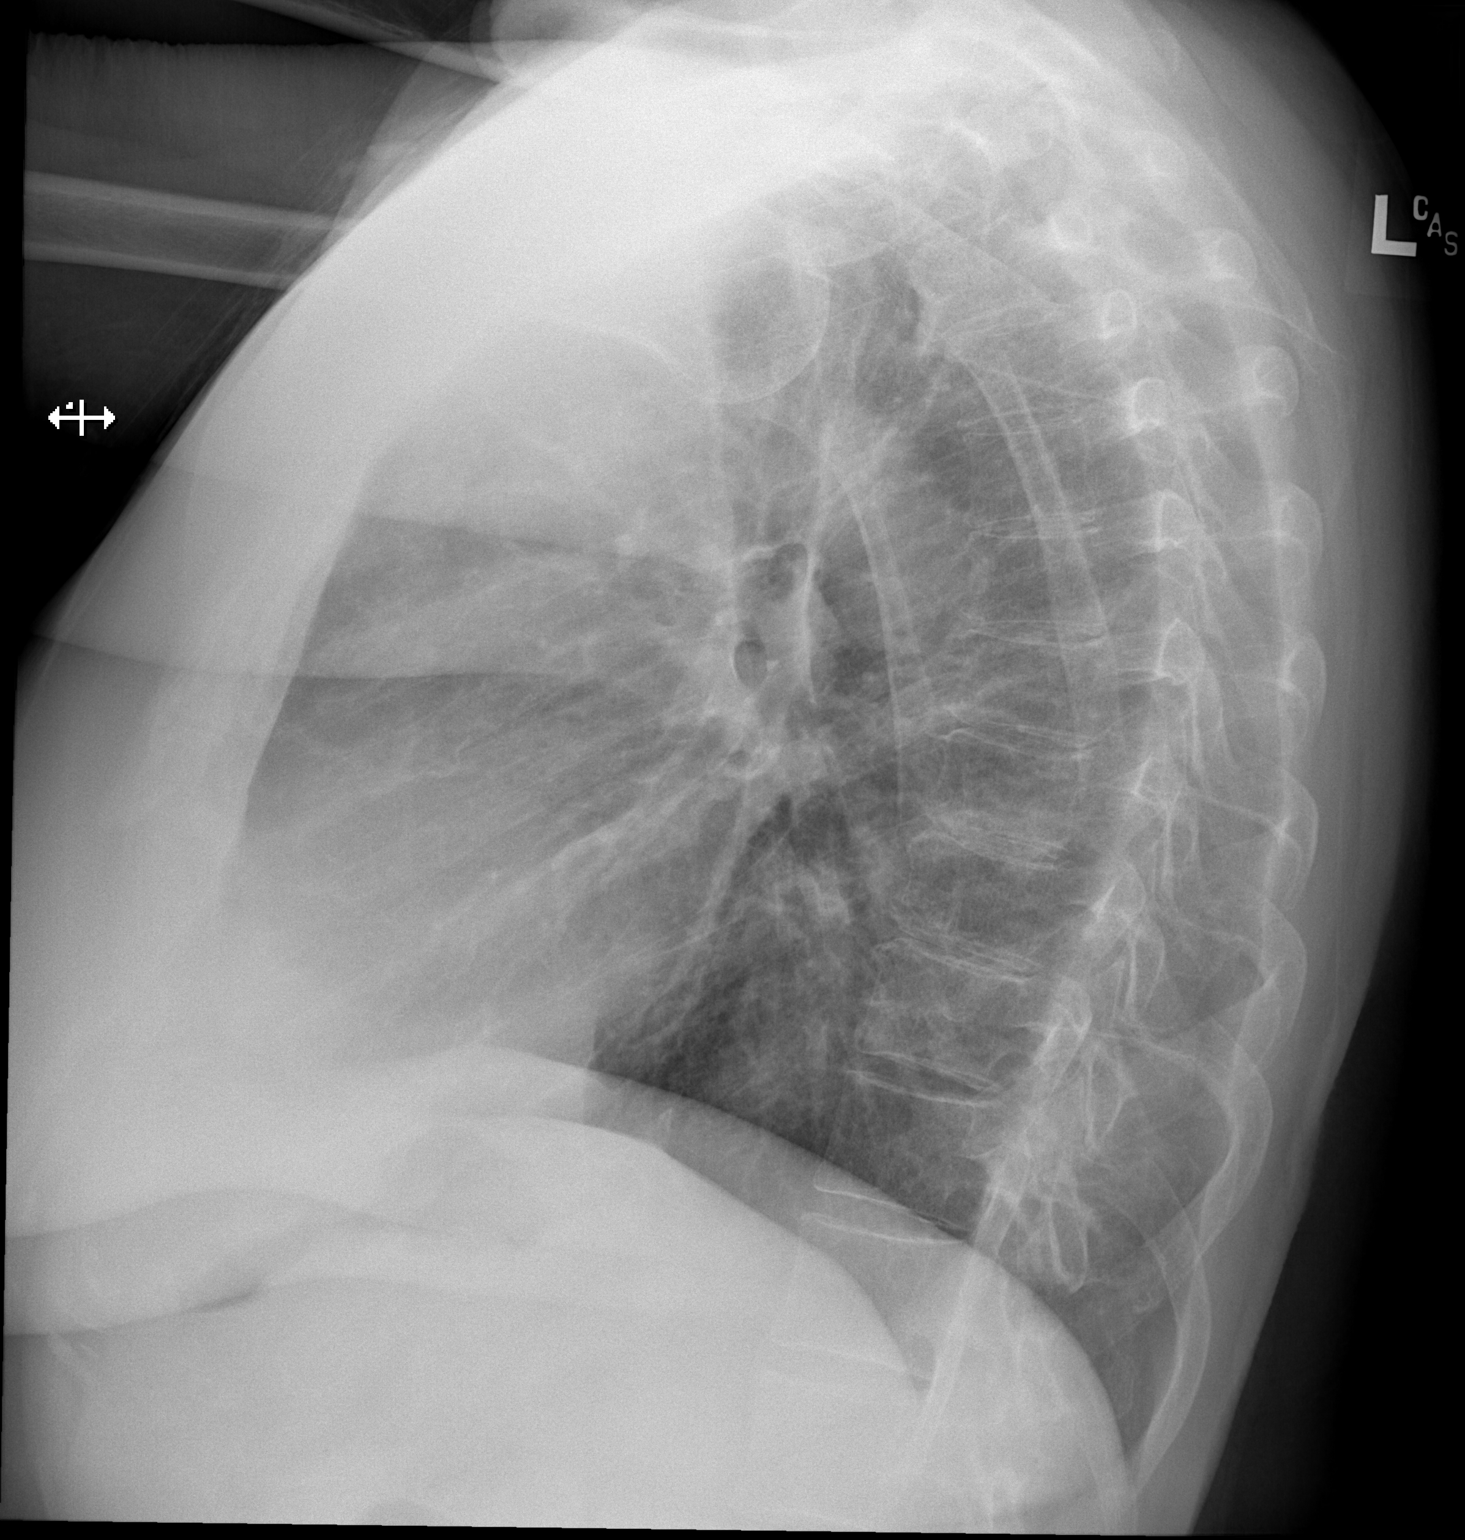

[2 of 2 positions shown; findings below may reference images not displayed]

FINDINGS: The heart size and mediastinal contours are within normal limits.
Both lungs are clear. The visualized skeletal structures are
unremarkable.
IMPRESSION: No active cardiopulmonary disease.

## 2018-04-05 LAB — HM MAMMOGRAPHY: HM Mammogram: NORMAL (ref 0–4)

## 2019-10-24 ENCOUNTER — Other Ambulatory Visit: Payer: Self-pay

## 2019-10-24 MED ORDER — FUROSEMIDE 20 MG PO TABS: 20.0000 mg | ORAL_TABLET | Freq: Every day | ORAL | 2 refills | Status: DC

## 2019-11-11 ENCOUNTER — Other Ambulatory Visit: Payer: Self-pay | Admitting: Legal Medicine

## 2019-11-17 ENCOUNTER — Encounter: Payer: Self-pay | Admitting: Legal Medicine

## 2019-12-02 ENCOUNTER — Other Ambulatory Visit: Payer: Self-pay | Admitting: Legal Medicine

## 2019-12-11 DIAGNOSIS — I87303 Chronic venous hypertension (idiopathic) without complications of bilateral lower extremity: Secondary | ICD-10-CM

## 2019-12-11 DIAGNOSIS — I10 Essential (primary) hypertension: Secondary | ICD-10-CM | POA: Insufficient documentation

## 2019-12-11 DIAGNOSIS — H8103 Meniere's disease, bilateral: Secondary | ICD-10-CM

## 2019-12-11 DIAGNOSIS — F411 Generalized anxiety disorder: Secondary | ICD-10-CM | POA: Insufficient documentation

## 2019-12-11 DIAGNOSIS — G2581 Restless legs syndrome: Secondary | ICD-10-CM | POA: Insufficient documentation

## 2019-12-11 DIAGNOSIS — C4370 Malignant melanoma of unspecified lower limb, including hip: Secondary | ICD-10-CM | POA: Insufficient documentation

## 2019-12-11 HISTORY — DX: Chronic venous hypertension (idiopathic) without complications of bilateral lower extremity: I87.303

## 2019-12-11 HISTORY — DX: Malignant melanoma of unspecified lower limb, including hip: C43.70

## 2019-12-11 HISTORY — DX: Restless legs syndrome: G25.81

## 2019-12-11 HISTORY — DX: Meniere's disease, bilateral: H81.03

## 2019-12-12 ENCOUNTER — Ambulatory Visit (INDEPENDENT_AMBULATORY_CARE_PROVIDER_SITE_OTHER): Payer: Medicare Other | Admitting: Legal Medicine

## 2019-12-12 ENCOUNTER — Encounter: Payer: Self-pay | Admitting: Legal Medicine

## 2019-12-12 ENCOUNTER — Other Ambulatory Visit: Payer: Self-pay

## 2019-12-12 VITALS — BP 120/80 | HR 76 | Temp 97.7°F | Resp 16 | Ht 70.0 in | Wt 190.8 lb

## 2019-12-12 DIAGNOSIS — I87303 Chronic venous hypertension (idiopathic) without complications of bilateral lower extremity: Secondary | ICD-10-CM | POA: Diagnosis not present

## 2019-12-12 DIAGNOSIS — R739 Hyperglycemia, unspecified: Secondary | ICD-10-CM | POA: Insufficient documentation

## 2019-12-12 HISTORY — DX: Hyperglycemia, unspecified: R73.9

## 2019-12-12 MED ORDER — FUROSEMIDE 40 MG PO TABS: 40.0000 mg | ORAL_TABLET | Freq: Every day | ORAL | 3 refills | Status: DC

## 2019-12-12 NOTE — Assessment & Plan Note (Signed)
Recurrent edema right leg is greater than left after THA and TKA.  She is having chronic venous edema and possibly some lymphatic edema.  Start lasix 40mg  qd and use support hose.  If not successful, refer to vascular and venin.

## 2019-12-12 NOTE — Progress Notes (Signed)
Established Patient Office Visit  Subjective:  Patient ID: Susan Roberson, female    DOB: 01-05-1951  Age: 69 y.o. MRN: [id number]  CC:  Chief Complaint  Patient presents with  . Edema    HPI Susan Roberson presents for Chronic peripheral edema right leg.  ShE is planning surgery on skin cancer. She has THA and TKA on right 3 + edema.  Past Medical History:  Diagnosis Date  . Arthritis   . Cancer ([place])    pre cancerous skin cancer with multiple lesions removed  . Complication of anesthesia    broken teeth  . Hypertension     Past Surgical History:  Procedure Laterality Date  . SKIN CANCER EXCISION Bilateral    legs  . TOTAL HIP ARTHROPLASTY Right    5 yrs ago  . TOTAL KNEE ARTHROPLASTY Right 03/10/2016   Procedure: RIGHT TOTAL KNEE ARTHROPLASTY;  Surgeon: Vickey Huger, MD;  Location: [facility];  Service: Orthopedics;  Laterality: Right;    History reviewed. No pertinent family history.  Social History   Socioeconomic History  . Marital status: Married    Spouse name: Not on file  . Number of children: Not on file  . Years of education: Not on file  . Highest education level: Not on file  Occupational History  . Not on file  Tobacco Use  . Smoking status: Never Smoker  . Smokeless tobacco: Never Used  Substance and Sexual Activity  . Alcohol use: No  . Drug use: No  . Sexual activity: Not on file  Other Topics Concern  . Not on file  Social History Narrative  . Not on file   Social Determinants of Health   Financial Resource Strain:   . Difficulty of Paying Living Expenses:   Food Insecurity:   . Worried About Charity fundraiser in the Last Year:   . Arboriculturist in the Last Year:   Transportation Needs:   . Film/video editor (Medical):   Marland Kitchen Lack of Transportation (Non-Medical):   Physical Activity:   . Days of Exercise per Week:   . Minutes of Exercise per Session:   Stress:   . Feeling of Stress :   Social Connections:   . Frequency of  Communication with Friends and Family:   . Frequency of Social Gatherings with Friends and Family:   . Attends Religious Services:   . Active Member of Clubs or Organizations:   . Attends Archivist Meetings:   Marland Kitchen Marital Status:   Intimate Partner Violence:   . Fear of Current or Ex-Partner:   . Emotionally Abused:   Marland Kitchen Physically Abused:   . Sexually Abused:     Outpatient Medications Prior to Visit  Medication Sig Dispense Refill  . amLODipine (NORVASC) 10 MG tablet Take 10 mg by mouth daily.    . benazepril (LOTENSIN) 40 MG tablet TAKE 1 TABLET BY MOUTH DAILY 90 tablet 2  . celecoxib (CELEBREX) 200 MG capsule Take 200 mg by mouth daily.    Marland Kitchen LORazepam (ATIVAN) 0.5 MG tablet Take 0.5 mg by mouth 3 (three) times daily as needed for anxiety.    Marland Kitchen olmesartan (BENICAR) 20 MG tablet Take 20 mg by mouth daily.    . pramipexole (MIRAPEX) 0.25 MG tablet TAKE 1 TABLET BY MOUTH EVERY NIGHT AT BEDTIME 90 tablet 2  . furosemide (LASIX) 20 MG tablet Take 1 tablet (20 mg total) by mouth daily. 90 tablet 2  . acetaminophen (TYLENOL)  500 MG tablet Take 1,000 mg by mouth every 6 (six) hours as needed.    Marland Kitchen aspirin EC 325 MG EC tablet Take 1 tablet (325 mg total) by mouth every 12 (twelve) hours. 30 tablet 0  . methocarbamol (ROBAXIN) 500 MG tablet Take 1-2 tablets (500-1,000 mg total) by mouth every 6 (six) hours as needed for muscle spasms. 60 tablet 0  . oxyCODONE (OXY IR/ROXICODONE) 5 MG immediate release tablet Take 1-2 tablets (5-10 mg total) by mouth every 3 (three) hours as needed for breakthrough pain. 90 tablet 0  . traMADol (ULTRAM) 50 MG tablet Take 50 mg by mouth every 6 (six) hours as needed for moderate pain.     No facility-administered medications prior to visit.    Allergies  Allergen Reactions  . No Known Allergies     ROS Review of Systems  Constitutional: Negative.   Eyes: Negative.   Respiratory: Negative.   Cardiovascular: Negative.   Gastrointestinal:  Negative.   Genitourinary: Negative.   Musculoskeletal: Negative.   Skin:       3 + edema right leg.      Objective:    Physical Exam  Constitutional: She appears well-developed and well-nourished.  HENT:  Head: Normocephalic and atraumatic.  Eyes: Pupils are equal, round, and reactive to light. Conjunctivae are normal.  Cardiovascular: Normal rate, regular rhythm, normal heart sounds and intact distal pulses.  Pulmonary/Chest: Effort normal and breath sounds normal.  Musculoskeletal:     Comments: 3+ edema right leg, no howman  Vitals reviewed.   BP 120/80   Pulse 76   Temp 97.7 F (36.5 C)   Resp 16   Ht 5\' 10"  (1.778 m)   Wt 190 lb 12.8 oz (86.5 kg)   SpO2 98%   BMI 27.38 kg/m  Wt Readings from Last 3 Encounters:  12/12/19 190 lb 12.8 oz (86.5 kg)  03/10/16 177 lb 1.6 oz (80.3 kg)  02/28/16 177 lb 1.6 oz (80.3 kg)     Health Maintenance Due  Topic Date Due  . Hepatitis C Screening  Never done  . COVID-19 Vaccine (1) Never done  . TETANUS/TDAP  Never done  . MAMMOGRAM  Never done  . COLONOSCOPY  Never done  . DEXA SCAN  Never done  . PNA vac Low Risk Adult (1 of 2 - PCV13) Never done    There are no preventive care reminders to display for this patient.  No results found for: TSH Lab Results  Component Value Date   WBC 13.6 (H) 03/11/2016   HGB 11.4 (L) 03/11/2016   HCT 35.3 (L) 03/11/2016   MCV 89.1 03/11/2016   PLT 294 03/11/2016   Lab Results  Component Value Date   NA 137 03/11/2016   K 3.7 03/11/2016   CO2 26 03/11/2016   GLUCOSE 134 (H) 03/11/2016   BUN 9 03/11/2016   CREATININE 0.75 03/11/2016   BILITOT 0.4 02/28/2016   ALKPHOS 80 02/28/2016   AST 22 02/28/2016   ALT 18 02/28/2016   PROT 6.8 02/28/2016   ALBUMIN 3.9 02/28/2016   CALCIUM 8.9 03/11/2016   ANIONGAP 8 03/11/2016   No results found for: CHOL No results found for: HDL No results found for: LDLCALC No results found for: TRIG No results found for: CHOLHDL No results  found for: HGBA1C    Assessment & Plan:   Problem List Items Addressed This Visit      Cardiovascular and Mediastinum   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  She is having chronic venous edema and possibly some lymphatic edema.  Start lasix 40mg  qd and use support hose.  If not successful, refer to vascular and venin.      Relevant Medications   amLODipine (NORVASC) 10 MG tablet   furosemide (LASIX) 40 MG tablet     Other   Hyperglycemia - Primary    Plan to check A1c      Relevant Orders   Hemoglobin A1c      Meds ordered this encounter  Medications  . furosemide (LASIX) 40 MG tablet    Sig: Take 1 tablet (40 mg total) by mouth daily.    Dispense:  30 tablet    Refill:  3    Follow-up: Return if symptoms worsen or fail to improve.    Reinaldo Meeker, MD

## 2019-12-12 NOTE — Assessment & Plan Note (Signed)
Plan to check A1c. ?

## 2019-12-13 LAB — HEMOGLOBIN A1C
Est. average glucose Bld gHb Est-mCnc: 126 mg/dL
Hgb A1c MFr Bld: 6 % — ABNORMAL HIGH (ref 4.8–5.6)

## 2019-12-13 NOTE — Progress Notes (Signed)
A1c 6.0 prediabetes- should follow diabetic diet lp

## 2019-12-20 ENCOUNTER — Other Ambulatory Visit: Payer: Self-pay

## 2019-12-20 MED ORDER — LORAZEPAM 0.5 MG PO TABS: 0.5000 mg | ORAL_TABLET | Freq: Three times a day (TID) | ORAL | 3 refills | Status: DC | PRN

## 2019-12-20 NOTE — Telephone Encounter (Signed)
Patient called in for a refill of Lorazepam and to tell Dr. Henrene Pastor that her new fluid medication is really working.

## 2019-12-28 ENCOUNTER — Other Ambulatory Visit: Payer: Self-pay | Admitting: Legal Medicine

## 2020-04-11 ENCOUNTER — Other Ambulatory Visit: Payer: Self-pay | Admitting: Legal Medicine

## 2020-04-11 DIAGNOSIS — I87303 Chronic venous hypertension (idiopathic) without complications of bilateral lower extremity: Secondary | ICD-10-CM

## 2020-05-05 ENCOUNTER — Other Ambulatory Visit: Payer: Self-pay | Admitting: Legal Medicine

## 2020-05-05 DIAGNOSIS — I10 Essential (primary) hypertension: Secondary | ICD-10-CM

## 2020-06-24 ENCOUNTER — Encounter: Payer: Self-pay | Admitting: Nurse Practitioner

## 2020-06-25 ENCOUNTER — Encounter: Payer: Self-pay | Admitting: Nurse Practitioner

## 2020-06-25 ENCOUNTER — Ambulatory Visit (INDEPENDENT_AMBULATORY_CARE_PROVIDER_SITE_OTHER): Payer: Medicare Other | Admitting: Nurse Practitioner

## 2020-06-25 ENCOUNTER — Other Ambulatory Visit: Payer: Self-pay

## 2020-06-25 VITALS — BP 182/90 | HR 68 | Temp 97.5°F | Ht 70.0 in | Wt 192.0 lb

## 2020-06-25 DIAGNOSIS — Z0000 Encounter for general adult medical examination without abnormal findings: Secondary | ICD-10-CM

## 2020-06-25 DIAGNOSIS — M1991 Primary osteoarthritis, unspecified site: Secondary | ICD-10-CM

## 2020-06-25 DIAGNOSIS — Z6827 Body mass index (BMI) 27.0-27.9, adult: Secondary | ICD-10-CM

## 2020-06-25 DIAGNOSIS — Z23 Encounter for immunization: Secondary | ICD-10-CM

## 2020-06-25 DIAGNOSIS — R739 Hyperglycemia, unspecified: Secondary | ICD-10-CM | POA: Diagnosis not present

## 2020-06-25 DIAGNOSIS — Z1231 Encounter for screening mammogram for malignant neoplasm of breast: Secondary | ICD-10-CM

## 2020-06-25 DIAGNOSIS — I10 Essential (primary) hypertension: Secondary | ICD-10-CM

## 2020-06-25 DIAGNOSIS — Z13820 Encounter for screening for osteoporosis: Secondary | ICD-10-CM

## 2020-06-25 DIAGNOSIS — Z780 Asymptomatic menopausal state: Secondary | ICD-10-CM

## 2020-06-25 NOTE — Patient Instructions (Signed)
Return in 1 week for Medicare annual wellness visit Fasting lipid panel next Monday Continue current medications until we get lab results Follow heart-healthy eating plan  DASH Eating Plan DASH stands for "Dietary Approaches to Stop Hypertension." The DASH eating plan is a healthy eating plan that has been shown to reduce high blood pressure (hypertension). It may also reduce your risk for type 2 diabetes, heart disease, and stroke. The DASH eating plan may also help with weight loss. What are tips for following this plan?  General guidelines  Avoid eating more than 2,300 mg (milligrams) of salt (sodium) a day. If you have hypertension, you may need to reduce your sodium intake to 1,500 mg a day.  Limit alcohol intake to no more than 1 drink a day for nonpregnant women and 2 drinks a day for men. One drink equals 12 oz of beer, 5 oz of wine, or 1 oz of hard liquor.  Work with your health care provider to maintain a healthy body weight or to lose weight. Ask what an ideal weight is for you.  Get at least 30 minutes of exercise that causes your heart to beat faster (aerobic exercise) most days of the week. Activities may include walking, swimming, or biking.  Work with your health care provider or diet and nutrition specialist (dietitian) to adjust your eating plan to your individual calorie needs. Reading food labels   Check food labels for the amount of sodium per serving. Choose foods with less than 5 percent of the Daily Value of sodium. Generally, foods with less than 300 mg of sodium per serving fit into this eating plan.  To find whole grains, look for the word "whole" as the first word in the ingredient list. Shopping  Buy products labeled as "low-sodium" or "no salt added."  Buy fresh foods. Avoid canned foods and premade or frozen meals. Cooking  Avoid adding salt when cooking. Use salt-free seasonings or herbs instead of table salt or sea salt. Check with your health care  provider or pharmacist before using salt substitutes.  Do not fry foods. Cook foods using healthy methods such as baking, boiling, grilling, and broiling instead.  Cook with heart-healthy oils, such as olive, canola, soybean, or sunflower oil. Meal planning  Eat a balanced diet that includes: ? 5 or more servings of fruits and vegetables each day. At each meal, try to fill half of your plate with fruits and vegetables. ? Up to 6-8 servings of whole grains each day. ? Less than 6 oz of lean meat, poultry, or fish each day. A 3-oz serving of meat is about the same size as a deck of cards. One egg equals 1 oz. ? 2 servings of low-fat dairy each day. ? A serving of nuts, seeds, or beans 5 times each week. ? Heart-healthy fats. Healthy fats called Omega-3 fatty acids are found in foods such as flaxseeds and coldwater fish, like sardines, salmon, and mackerel.  Limit how much you eat of the following: ? Canned or prepackaged foods. ? Food that is high in trans fat, such as fried foods. ? Food that is high in saturated fat, such as fatty meat. ? Sweets, desserts, sugary drinks, and other foods with added sugar. ? Full-fat dairy products.  Do not salt foods before eating.  Try to eat at least 2 vegetarian meals each week.  Eat more home-cooked food and less restaurant, buffet, and fast food.  When eating at a restaurant, ask that your food be  prepared with less salt or no salt, if possible. What foods are recommended? The items listed may not be a complete list. Talk with your dietitian about what dietary choices are best for you. Grains Whole-grain or whole-wheat bread. Whole-grain or whole-wheat pasta. Brown rice. Susan Roberson. Bulgur. Whole-grain and low-sodium cereals. Pita bread. Low-fat, low-sodium crackers. Whole-wheat flour tortillas. Vegetables Fresh or frozen vegetables (raw, steamed, roasted, or grilled). Low-sodium or reduced-sodium tomato and vegetable juice. Low-sodium or  reduced-sodium tomato sauce and tomato paste. Low-sodium or reduced-sodium canned vegetables. Fruits All fresh, dried, or frozen fruit. Canned fruit in natural juice (without added sugar). Meat and other protein foods Skinless chicken or [place]. Ground chicken or [place]. Pork with fat trimmed off. Fish and seafood. Egg whites. Dried beans, peas, or lentils. Unsalted nuts, nut butters, and seeds. Unsalted canned beans. Lean cuts of beef with fat trimmed off. Low-sodium, lean deli meat. Dairy Low-fat (1%) or fat-free (skim) milk. Fat-free, low-fat, or reduced-fat cheeses. Nonfat, low-sodium ricotta or cottage cheese. Low-fat or nonfat yogurt. Low-fat, low-sodium cheese. Fats and oils Soft margarine without trans fats. Vegetable oil. Low-fat, reduced-fat, or light mayonnaise and salad dressings (reduced-sodium). Canola, safflower, olive, soybean, and sunflower oils. Avocado. Seasoning and other foods Herbs. Spices. Seasoning mixes without salt. Unsalted popcorn and pretzels. Fat-free sweets. What foods are not recommended? The items listed may not be a complete list. Talk with your dietitian about what dietary choices are best for you. Grains Baked goods made with fat, such as croissants, muffins, or some breads. Dry pasta or rice meal packs. Vegetables Creamed or fried vegetables. Vegetables in a cheese sauce. Regular canned vegetables (not low-sodium or reduced-sodium). Regular canned tomato sauce and paste (not low-sodium or reduced-sodium). Regular tomato and vegetable juice (not low-sodium or reduced-sodium). Susan Roberson. Olives. Fruits Canned fruit in a light or heavy syrup. Fried fruit. Fruit in cream or butter sauce. Meat and other protein foods Fatty cuts of meat. Ribs. Fried meat. Susan Roberson. Sausage. Bologna and other processed lunch meats. Salami. Fatback. Hotdogs. Bratwurst. Salted nuts and seeds. Canned beans with added salt. Canned or smoked fish. Whole eggs or egg yolks. Chicken or [place]  with skin. Dairy Whole or 2% milk, cream, and half-and-half. Whole or full-fat cream cheese. Whole-fat or sweetened yogurt. Full-fat cheese. Nondairy creamers. Whipped toppings. Processed cheese and cheese spreads. Fats and oils Butter. Stick margarine. Lard. Shortening. Ghee. Bacon fat. Tropical oils, such as coconut, palm kernel, or palm oil. Seasoning and other foods Salted popcorn and pretzels. Onion salt, garlic salt, seasoned salt, table salt, and sea salt. Worcestershire sauce. Tartar sauce. Barbecue sauce. Teriyaki sauce. Soy sauce, including reduced-sodium. Steak sauce. Canned and packaged gravies. Fish sauce. Oyster sauce. Cocktail sauce. Horseradish that you find on the shelf. Ketchup. Mustard. Meat flavorings and tenderizers. Bouillon cubes. Hot sauce and Tabasco sauce. Premade or packaged marinades. Premade or packaged taco seasonings. Relishes. Regular salad dressings. Where to find more information:  National Heart, Lung, and [place]: https://wilson-eaton.com/  American Heart Association: www.heart.org Summary  The DASH eating plan is a healthy eating plan that has been shown to reduce high blood pressure (hypertension). It may also reduce your risk for type 2 diabetes, heart disease, and stroke.  With the DASH eating plan, you should limit salt (sodium) intake to 2,300 mg a day. If you have hypertension, you may need to reduce your sodium intake to 1,500 mg a day.  When on the DASH eating plan, aim to eat more fresh fruits and vegetables, whole grains,  lean proteins, low-fat dairy, and heart-healthy fats.  Work with your health care provider or diet and nutrition specialist (dietitian) to adjust your eating plan to your individual calorie needs. This information is not intended to replace advice given to you by your health care provider. Make sure you discuss any questions you have with your health care provider. Document Revised: 07/17/2017 Document Reviewed:  07/28/2016 Elsevier Patient Education  2020 [employer]. Managing Your Hypertension Hypertension is commonly called high blood pressure. This is when the force of your blood pressing against the walls of your arteries is too strong. Arteries are blood vessels that carry blood from your heart throughout your body. Hypertension forces the heart to work harder to pump blood, and may cause the arteries to become narrow or stiff. Having untreated or uncontrolled hypertension can cause heart attack, stroke, kidney disease, and other problems. What are blood pressure readings? A blood pressure reading consists of a higher number over a lower number. Ideally, your blood pressure should be below 120/80. The first ("top") number is called the systolic pressure. It is a measure of the pressure in your arteries as your heart beats. The second ("bottom") number is called the diastolic pressure. It is a measure of the pressure in your arteries as the heart relaxes. What does my blood pressure reading mean? Blood pressure is classified into four stages. Based on your blood pressure reading, your health care provider may use the following stages to determine what type of treatment you need, if any. Systolic pressure and diastolic pressure are measured in a unit called mm Hg. Normal  Systolic pressure: below 976.  Diastolic pressure: below 80. Elevated  Systolic pressure: 734-193.  Diastolic pressure: below 80. Hypertension stage 1  Systolic pressure: 790-240.  Diastolic pressure: 97-35. Hypertension stage 2  Systolic pressure: 329 or above.  Diastolic pressure: 90 or above. What health risks are associated with hypertension? Managing your hypertension is an important responsibility. Uncontrolled hypertension can lead to:  A heart attack.  A stroke.  A weakened blood vessel (aneurysm).  Heart failure.  Kidney damage.  Eye damage.  Metabolic syndrome.  Memory and concentration  problems. What changes can I make to manage my hypertension? Hypertension can be managed by making lifestyle changes and possibly by taking medicines. Your health care provider will help you make a plan to bring your blood pressure within a normal range. Eating and drinking   Eat a diet that is high in fiber and potassium, and low in salt (sodium), added sugar, and fat. An example eating plan is called the DASH (Dietary Approaches to Stop Hypertension) diet. To eat this way: ? Eat plenty of fresh fruits and vegetables. Try to fill half of your plate at each meal with fruits and vegetables. ? Eat whole grains, such as whole wheat pasta, brown rice, or whole grain bread. Fill about one quarter of your plate with whole grains. ? Eat low-fat diary products. ? Avoid fatty cuts of meat, processed or cured meats, and poultry with skin. Fill about one quarter of your plate with lean proteins such as fish, chicken without skin, beans, eggs, and tofu. ? Avoid premade and processed foods. These tend to be higher in sodium, added sugar, and fat.  Reduce your daily sodium intake. Most people with hypertension should eat less than 1,500 mg of sodium a day.  Limit alcohol intake to no more than 1 drink a day for nonpregnant women and 2 drinks a day for men. One drink  equals 12 oz of beer, 5 oz of wine, or 1 oz of hard liquor. Lifestyle  Work with your health care provider to maintain a healthy body weight, or to lose weight. Ask what an ideal weight is for you.  Get at least 30 minutes of exercise that causes your heart to beat faster (aerobic exercise) most days of the week. Activities may include walking, swimming, or biking.  Include exercise to strengthen your muscles (resistance exercise), such as weight lifting, as part of your weekly exercise routine. Try to do these types of exercises for 30 minutes at least 3 days a week.  Do not use any products that contain nicotine or tobacco, such as  cigarettes and e-cigarettes. If you need help quitting, ask your health care provider.  Control any long-term (chronic) conditions you have, such as high cholesterol or diabetes. Monitoring  Monitor your blood pressure at home as told by your health care provider. Your personal target blood pressure may vary depending on your medical conditions, your age, and other factors.  Have your blood pressure checked regularly, as often as told by your health care provider. Working with your health care provider  Review all the medicines you take with your health care provider because there may be side effects or interactions.  Talk with your health care provider about your diet, exercise habits, and other lifestyle factors that may be contributing to hypertension.  Visit your health care provider regularly. Your health care provider can help you create and adjust your plan for managing hypertension. Will I need medicine to control my blood pressure? Your health care provider may prescribe medicine if lifestyle changes are not enough to get your blood pressure under control, and if:  Your systolic blood pressure is 130 or higher.  Your diastolic blood pressure is 80 or higher. Take medicines only as told by your health care provider. Follow the directions carefully. Blood pressure medicines must be taken as prescribed. The medicine does not work as well when you skip doses. Skipping doses also puts you at risk for problems. Contact a health care provider if:  You think you are having a reaction to medicines you have taken.  You have repeated (recurrent) headaches.  You feel dizzy.  You have swelling in your ankles.  You have trouble with your vision. Get help right away if:  You develop a severe headache or confusion.  You have unusual weakness or numbness, or you feel faint.  You have severe pain in your chest or abdomen.  You vomit repeatedly.  You have trouble  breathing. Summary  Hypertension is when the force of blood pumping through your arteries is too strong. If this condition is not controlled, it may put you at risk for serious complications.  Your personal target blood pressure may vary depending on your medical conditions, your age, and other factors. For most people, a normal blood pressure is less than 120/80.  Hypertension is managed by lifestyle changes, medicines, or both. Lifestyle changes include weight loss, eating a healthy, low-sodium diet, exercising more, and limiting alcohol. This information is not intended to replace advice given to you by your health care provider. Make sure you discuss any questions you have with your health care provider. Document Revised: 11/26/2018 Document Reviewed: 07/02/2016 Elsevier Patient Education  2020 [place] 65 Years and Older, Female Preventive care refers to lifestyle choices and visits with your health care provider that can promote health and wellness. This includes:  A  yearly physical exam. This is also called an annual well check.  Regular dental and eye exams.  Immunizations.  Screening for certain conditions.  Healthy lifestyle choices, such as diet and exercise. What can I expect for my preventive care visit? Physical exam Your health care provider will check:  Height and weight. These may be used to calculate body mass index (BMI), which is a measurement that tells if you are at a healthy weight.  Heart rate and blood pressure.  Your skin for abnormal spots. Counseling Your health care provider may ask you questions about:  Alcohol, tobacco, and drug use.  Emotional well-being.  Home and relationship well-being.  Sexual activity.  Eating habits.  History of falls.  Memory and ability to understand (cognition).  Work and work Statistician.  Pregnancy and menstrual history. What immunizations do I need?  Influenza (flu) vaccine  This  is recommended every year. Tetanus, diphtheria, and pertussis (Tdap) vaccine  You may need a Td booster every 10 years. Varicella (chickenpox) vaccine  You may need this vaccine if you have not already been vaccinated. Zoster (shingles) vaccine  You may need this after age 44. Pneumococcal conjugate (PCV13) vaccine  One dose is recommended after age 57. Pneumococcal polysaccharide (PPSV23) vaccine  One dose is recommended after age 5. Measles, mumps, and rubella (MMR) vaccine  You may need at least one dose of MMR if you were born in 1957 or later. You may also need a second dose. Meningococcal conjugate (MenACWY) vaccine  You may need this if you have certain conditions. Hepatitis A vaccine  You may need this if you have certain conditions or if you travel or work in places where you may be exposed to hepatitis A. Hepatitis B vaccine  You may need this if you have certain conditions or if you travel or work in places where you may be exposed to hepatitis B. Haemophilus influenzae type b (Hib) vaccine  You may need this if you have certain conditions. You may receive vaccines as individual doses or as more than one vaccine together in one shot (combination vaccines). Talk with your health care provider about the risks and benefits of combination vaccines. What tests do I need? Blood tests  Lipid and cholesterol levels. These may be checked every 5 years, or more frequently depending on your overall health.  Hepatitis C test.  Hepatitis B test. Screening  Lung cancer screening. You may have this screening every year starting at age 31 if you have a 30-pack-year history of smoking and currently smoke or have quit within the past 15 years.  Colorectal cancer screening. All adults should have this screening starting at age 56 and continuing until age 81. Your health care provider may recommend screening at age 43 if you are at increased risk. You will have tests every 1-10  years, depending on your results and the type of screening test.  Diabetes screening. This is done by checking your blood sugar (glucose) after you have not eaten for a while (fasting). You may have this done every 1-3 years.  Mammogram. This may be done every 1-2 years. Talk with your health care provider about how often you should have regular mammograms.  BRCA-related cancer screening. This may be done if you have a family history of breast, ovarian, tubal, or peritoneal cancers. Other tests  Sexually transmitted disease (STD) testing.  Bone density scan. This is done to screen for osteoporosis. You may have this done starting at age 44. Follow  these instructions at home: Eating and drinking  Eat a diet that includes fresh fruits and vegetables, whole grains, lean protein, and low-fat dairy products. Limit your intake of foods with high amounts of sugar, saturated fats, and salt.  Take vitamin and mineral supplements as recommended by your health care provider.  Do not drink alcohol if your health care provider tells you not to drink.  If you drink alcohol: ? Limit how much you have to 0-1 drink a day. ? Be aware of how much alcohol is in your drink. In the [place] one drink equals one 12 oz bottle of beer (355 mL), one 5 oz glass of wine (148 mL), or one 1 oz glass of hard liquor (44 mL). Lifestyle  Take daily care of your teeth and gums.  Stay active. Exercise for at least 30 minutes on 5 or more days each week.  Do not use any products that contain nicotine or tobacco, such as cigarettes, e-cigarettes, and chewing tobacco. If you need help quitting, ask your health care provider.  If you are sexually active, practice safe sex. Use a condom or other form of protection in order to prevent STIs (sexually transmitted infections).  Talk with your health care provider about taking a low-dose aspirin or statin. What's next?  Go to your health care provider once a year for a well  check visit.  Ask your health care provider how often you should have your eyes and teeth checked.  Stay up to date on all vaccines. This information is not intended to replace advice given to you by your health care provider. Make sure you discuss any questions you have with your health care provider. Document Revised: 07/29/2018 Document Reviewed: 07/29/2018 Elsevier Patient Education  2020 [place] Maintenance, Female Adopting a healthy lifestyle and getting preventive care are important in promoting health and wellness. Ask your health care provider about:  The right schedule for you to have regular tests and exams.  Things you can do on your own to prevent diseases and keep yourself healthy. What should I know about diet, weight, and exercise? Eat a healthy diet   Eat a diet that includes plenty of vegetables, fruits, low-fat dairy products, and lean protein.  Do not eat a lot of foods that are high in solid fats, added sugars, or sodium. Maintain a healthy weight Body mass index (BMI) is used to identify weight problems. It estimates body fat based on height and weight. Your health care provider can help determine your BMI and help you achieve or maintain a healthy weight. Get regular exercise Get regular exercise. This is one of the most important things you can do for your health. Most adults should:  Exercise for at least 150 minutes each week. The exercise should increase your heart rate and make you sweat (moderate-intensity exercise).  Do strengthening exercises at least twice a week. This is in addition to the moderate-intensity exercise.  Spend less time sitting. Even light physical activity can be beneficial. Watch cholesterol and blood lipids Have your blood tested for lipids and cholesterol at 71 years of age, then have this test every 5 years. Have your cholesterol levels checked more often if:  Your lipid or cholesterol levels are high.  You are  older than 71 years of age.  You are at high risk for heart disease. What should I know about cancer screening? Depending on your health history and family history, you may need to have cancer screening at  various ages. This may include screening for:  Breast cancer.  Cervical cancer.  Colorectal cancer.  Skin cancer.  Lung cancer. What should I know about heart disease, diabetes, and high blood pressure? Blood pressure and heart disease  High blood pressure causes heart disease and increases the risk of stroke. This is more likely to develop in people who have high blood pressure readings, are of African descent, or are overweight.  Have your blood pressure checked: ? Every 3-5 years if you are 70-70 years of age. ? Every year if you are 61 years old or older. Diabetes Have regular diabetes screenings. This checks your fasting blood sugar level. Have the screening done:  Once every three years after age 59 if you are at a normal weight and have a low risk for diabetes.  More often and at a younger age if you are overweight or have a high risk for diabetes. What should I know about preventing infection? Hepatitis B If you have a higher risk for hepatitis B, you should be screened for this virus. Talk with your health care provider to find out if you are at risk for hepatitis B infection. Hepatitis C Testing is recommended for:  Everyone born from [birth year] through 1967.  Anyone with known risk factors for hepatitis C. Sexually transmitted infections (STIs)  Get screened for STIs, including gonorrhea and chlamydia, if: ? You are sexually active and are younger than 71 years of age. ? You are older than 71 years of age and your health care provider tells you that you are at risk for this type of infection. ? Your sexual activity has changed since you were last screened, and you are at increased risk for chlamydia or gonorrhea. Ask your health care provider if you are at  risk.  Ask your health care provider about whether you are at high risk for HIV. Your health care provider may recommend a prescription medicine to help prevent HIV infection. If you choose to take medicine to prevent HIV, you should first get tested for HIV. You should then be tested every 3 months for as long as you are taking the medicine. Pregnancy  If you are about to stop having your period (premenopausal) and you may become pregnant, seek counseling before you get pregnant.  Take 400 to 800 micrograms (mcg) of folic acid every day if you become pregnant.  Ask for birth control (contraception) if you want to prevent pregnancy. Osteoporosis and menopause Osteoporosis is a disease in which the bones lose minerals and strength with aging. This can result in bone fractures. If you are 19 years old or older, or if you are at risk for osteoporosis and fractures, ask your health care provider if you should:  Be screened for bone loss.  Take a calcium or vitamin D supplement to lower your risk of fractures.  Be given hormone replacement therapy (HRT) to treat symptoms of menopause. Follow these instructions at home: Lifestyle  Do not use any products that contain nicotine or tobacco, such as cigarettes, e-cigarettes, and chewing tobacco. If you need help quitting, ask your health care provider.  Do not use street drugs.  Do not share needles.  Ask your health care provider for help if you need support or information about quitting drugs. Alcohol use  Do not drink alcohol if: ? Your health care provider tells you not to drink. ? You are pregnant, may be pregnant, or are planning to become pregnant.  If you drink alcohol: ?  Limit how much you use to 0-1 drink a day. ? Limit intake if you are breastfeeding.  Be aware of how much alcohol is in your drink. In the [place] one drink equals one 12 oz bottle of beer (355 mL), one 5 oz glass of wine (148 mL), or one 1 oz glass of hard liquor  (44 mL). General instructions  Schedule regular health, dental, and eye exams.  Stay current with your vaccines.  Tell your health care provider if: ? You often feel depressed. ? You have ever been abused or do not feel safe at home. Summary  Adopting a healthy lifestyle and getting preventive care are important in promoting health and wellness.  Follow your health care provider's instructions about healthy diet, exercising, and getting tested or screened for diseases.  Follow your health care provider's instructions on monitoring your cholesterol and blood pressure. This information is not intended to replace advice given to you by your health care provider. Make sure you discuss any questions you have with your health care provider. Document Revised: 07/28/2018 Document Reviewed: 07/28/2018 Elsevier Patient Education  2020 [employer].

## 2020-06-25 NOTE — Progress Notes (Signed)
Subjective:  Patient ID: Susan Roberson, female    DOB: [birth year]  Age: 69 y.o. MRN: [id number]  Chief Complaint  Patient presents with  . To Re-Establish  . Hypertension    HPI  Susan Roberson is a 69 year old Caucasian female that is new to establish care. She has a history of hypertension, osteoarthritis, malignant melanoma, and anxiety. She has agreed to receive flu vaccine today. She has received two COVID-19 vaccinations. She is due for yearly mammogram screening; she has never had DEXA scan.   Hypertension Susan Roberson has had a diagnosis of hypertension for an unknown number of years. Hypertension is not currently well-controlled or at goat. BP today 182/90.Current treatment includes Lotensin-HCTZ 20/25. She denies chest pain, dyspnea, headache, or dizziness. She attempts to eat a heart-healthy diet and remain physically active. She is not currently exercising regularly.  Osteoarthritis Susan Roberson has a history of osteoarthritis for an unknown number of years. Current treatment are well-controlled with Celebrex 200 mg daily. She has undergone a right total hip and knee replacement several years ago that are doing well. She does not require assistive devices for ambulation currently.  Anxiety Patient has a history of generalized anxiety disorder for an unknown number of years. Current symptoms are well-controlled with Lorazepam 0.5mg  TID PRN. Denies insomnia, uncontrolled worrying, or irritability.   Malignant melanoma Susan Roberson has a history of malignant melanoma to right lower extremity. Pt has a healing excision site that is being followed by dermatology. Denies further suspicious lesions.        Current Outpatient Medications on File Prior to Visit  Medication Sig Dispense Refill  . benazepril-hydrochlorthiazide (LOTENSIN HCT) 20-25 MG tablet Take 1 tablet by mouth daily.    . celecoxib (CELEBREX) 200 MG capsule TAKE 1 CAPSULE BY MOUTH DAILY. 90 capsule 2  . LORazepam (ATIVAN) 0.5 MG tablet Take 1 tablet  (0.5 mg total) by mouth 3 (three) times daily as needed for anxiety. 90 tablet 3  . mupirocin ointment (BACTROBAN) 2 % Apply topically 2 (two) times daily.    . pramipexole (MIRAPEX) 0.25 MG tablet TAKE 1 TABLET BY MOUTH EVERY NIGHT AT BEDTIME 90 tablet 2   No current facility-administered medications on file prior to visit.   Past Medical History:  Diagnosis Date  . Arthritis   . Cancer ([place])    pre cancerous skin cancer with multiple lesions removed  . Complication of anesthesia    broken teeth  . Essential (primary) hypertension   . Generalized anxiety disorder   . Hyperkalemia   . Hypertension    Past Surgical History:  Procedure Laterality Date  . SKIN CANCER EXCISION Bilateral    legs  . TOTAL HIP ARTHROPLASTY Right    5 yrs ago  . TOTAL KNEE ARTHROPLASTY Right 03/10/2016   Procedure: RIGHT TOTAL KNEE ARTHROPLASTY;  Surgeon: Vickey Huger, MD;  Location: [facility];  Service: Orthopedics;  Laterality: Right;    Family History  Problem Relation Age of Onset  . Stroke Father   . Cerebrovascular Accident Other   . Diabetes Mellitus II Other   . Hyperlipidemia Other   . Hypertension Other    Social History   Socioeconomic History  . Marital status: Married    Spouse name: Not on file  . Number of children: Not on file  . Years of education: Not on file  . Highest education level: Not on file  Occupational History  . Not on file  Tobacco Use  . Smoking status: Never Smoker  . Smokeless  tobacco: Never Used  Vaping Use  . Vaping Use: Never used  Substance and Sexual Activity  . Alcohol use: Yes    Comment: Drinks on a social basis.  . Drug use: No  . Sexual activity: Not on file  Other Topics Concern  . Not on file  Social History Narrative  . Not on file   Social Determinants of Health   Financial Resource Strain:   . Difficulty of Paying Living Expenses: Not on file  Food Insecurity:   . Worried About Charity fundraiser in the Last Year: Not on file  . Ran  Out of Food in the Last Year: Not on file  Transportation Needs:   . Lack of Transportation (Medical): Not on file  . Lack of Transportation (Non-Medical): Not on file  Physical Activity:   . Days of Exercise per Week: Not on file  . Minutes of Exercise per Session: Not on file  Stress:   . Feeling of Stress : Not on file  Social Connections:   . Frequency of Communication with Friends and Family: Not on file  . Frequency of Social Gatherings with Friends and Family: Not on file  . Attends Religious Services: Not on file  . Active Member of Clubs or Organizations: Not on file  . Attends Archivist Meetings: Not on file  . Marital Status: Not on file    Review of Systems  Constitutional: Negative for fatigue and fever.  HENT: Positive for hearing loss. Negative for congestion, ear pain, rhinorrhea, sinus pressure and sore throat.   Eyes: Negative for pain and visual disturbance.  Respiratory: Negative for cough, chest tightness, shortness of breath and wheezing.   Cardiovascular: Negative for chest pain and palpitations.  Gastrointestinal: Negative for abdominal pain, constipation, diarrhea, nausea and vomiting.  Endocrine: Negative for cold intolerance, heat intolerance, polydipsia, polyphagia and polyuria.  Genitourinary: Negative for dysuria and hematuria.  Musculoskeletal: Negative for arthralgias, back pain, joint swelling and myalgias.  Skin: Positive for wound (excision of skin cancer from leg). Negative for rash.  Allergic/Immunologic: Negative for environmental allergies.  Neurological: Negative for dizziness, weakness and headaches.  Hematological: Negative for adenopathy. Does not bruise/bleed easily.  Psychiatric/Behavioral: Negative for dysphoric mood and suicidal ideas. The patient is not nervous/anxious (anxiety well-controlled).      Objective:  BP (!) 182/90 (BP Location: Left Arm, Patient Position: Sitting)   Pulse 68   Temp (!) 97.5 F (36.4 C)  (Temporal)   Ht 5\' 10"  (1.778 m)   Wt 192 lb (87.1 kg)   SpO2 100%   BMI 27.55 kg/m   BP/Weight 06/25/2020 12/12/2019 6/65/9935  Systolic BP 701 779 390  Diastolic BP 90 80 61  Wt. (Lbs) 192 190.8 -  BMI 27.55 27.38 -    Physical Exam Vitals reviewed.  Constitutional:      Appearance: Normal appearance.  HENT:     Head: Normocephalic.     Right Ear: Tympanic membrane, ear canal and external ear normal.     Left Ear: Tympanic membrane, ear canal and external ear normal.     Nose: Nose normal.     Mouth/Throat:     Mouth: Mucous membranes are moist.  Cardiovascular:     Rate and Rhythm: Normal rate and regular rhythm.     Pulses: Normal pulses.     Heart sounds: Normal heart sounds.  Pulmonary:     Effort: Pulmonary effort is normal.     Breath sounds: Normal breath sounds.  Abdominal:     General: Bowel sounds are normal.     Palpations: Abdomen is soft.  Musculoskeletal:        General: No swelling or tenderness.     Cervical back: Normal range of motion.  Skin:    General: Skin is warm and dry.     Capillary Refill: Capillary refill takes less than 2 seconds.  Neurological:     General: No focal deficit present.     Mental Status: She is alert and oriented to person, place, and time.  Psychiatric:        Mood and Affect: Mood normal.        Behavior: Behavior normal.        Thought Content: Thought content normal.        Judgment: Judgment normal.            Lab Results  Component Value Date   WBC 13.6 (H) 03/11/2016   HGB 11.4 (L) 03/11/2016   HCT 35.3 (L) 03/11/2016   PLT 294 03/11/2016   GLUCOSE 134 (H) 03/11/2016   ALT 18 02/28/2016   AST 22 02/28/2016   NA 137 03/11/2016   K 3.7 03/11/2016   CL 103 03/11/2016   CREATININE 0.75 03/11/2016   BUN 9 03/11/2016   CO2 26 03/11/2016   INR 0.91 02/28/2016   HGBA1C 6.0 (H) 12/12/2019      Assessment & Plan:     1. Essential hypertension, benign - CBC with Differential/Platelet -  Comprehensive metabolic panel - TSH - Hemoglobin A1c  2. Hyperglycemia - CBC with Differential/Platelet - Comprehensive metabolic panel - TSH - Hemoglobin A1c  3. Physical exam, annual - CBC with Differential/Platelet - Comprehensive metabolic panel - TSH - Hemoglobin A1c  4. Need for vaccination - Flu Vaccine QUAD High Dose(Fluad)  5. Encounter for screening mammogram for malignant neoplasm of breast - MM DIGITAL SCREENING BILATERAL  6. Encounter for osteoporosis screening in asymptomatic postmenopausal patient - DG Bone Density  7. Primary osteoarthritis, unspecified site -continue Celebrex 200 mg daily  8. BMI 27.0-27.9,adult -continue heart healthy diet    Return in 1 week for Medicare annual wellness visit Fasting lipid panel next Monday Continue current medications until we get lab results Follow heart-healthy eating plan   Follow-up: 1 week  An After Visit Summary was printed and given to the patient.  Jerrell Belfast, DNP [facility] [phone]

## 2020-06-26 ENCOUNTER — Other Ambulatory Visit: Payer: Self-pay | Admitting: Nurse Practitioner

## 2020-06-26 ENCOUNTER — Ambulatory Visit: Payer: Medicare Other

## 2020-06-26 DIAGNOSIS — E875 Hyperkalemia: Secondary | ICD-10-CM

## 2020-06-26 LAB — COMPREHENSIVE METABOLIC PANEL
ALT: 18 IU/L (ref 0–32)
AST: 20 IU/L (ref 0–40)
Albumin/Globulin Ratio: 1.5 (ref 1.2–2.2)
Albumin: 4.2 g/dL (ref 3.7–4.7)
Alkaline Phosphatase: 112 IU/L (ref 44–121)
BUN/Creatinine Ratio: 26 (ref 12–28)
BUN: 25 mg/dL (ref 8–27)
Bilirubin Total: <0.2 mg/dL (ref 0.0–1.2)
CO2: 24 mmol/L (ref 20–29)
Calcium: 9.9 mg/dL (ref 8.7–10.3)
Chloride: 102 mmol/L (ref 96–106)
Creatinine, Ser: 0.96 mg/dL (ref 0.57–1.00)
GFR calc Af Amer: 69 mL/min/{1.73_m2} (ref >59)
GFR calc non Af Amer: 60 mL/min/{1.73_m2} (ref >59)
Globulin, Total: 2.8 g/dL (ref 1.5–4.5)
Glucose: 80 mg/dL (ref 65–99)
Potassium: 6.2 mmol/L (ref 3.5–5.2)
Sodium: 138 mmol/L (ref 134–144)
Total Protein: 7 g/dL (ref 6.0–8.5)

## 2020-06-26 LAB — CBC WITH DIFFERENTIAL/PLATELET
Basophils Absolute: 0.1 10*3/uL (ref 0.0–0.2)
Basos: 1 %
EOS (ABSOLUTE): 0.2 10*3/uL (ref 0.0–0.4)
Eos: 3 %
Hematocrit: 38.9 % (ref 34.0–46.6)
Hemoglobin: 13.2 g/dL (ref 11.1–15.9)
Immature Grans (Abs): 0 10*3/uL (ref 0.0–0.1)
Immature Granulocytes: 0 %
Lymphocytes Absolute: 2.4 10*3/uL (ref 0.7–3.1)
Lymphs: 32 %
MCH: 29.2 pg (ref 26.6–33.0)
MCHC: 33.9 g/dL (ref 31.5–35.7)
MCV: 86 fL (ref 79–97)
Monocytes Absolute: 0.8 10*3/uL (ref 0.1–0.9)
Monocytes: 10 %
Neutrophils Absolute: 4 10*3/uL (ref 1.4–7.0)
Neutrophils: 54 %
Platelets: 404 10*3/uL (ref 150–450)
RBC: 4.52 x10E6/uL (ref 3.77–5.28)
RDW: 12.5 % (ref 11.7–15.4)
WBC: 7.5 10*3/uL (ref 3.4–10.8)

## 2020-06-26 LAB — HEMOGLOBIN A1C
Est. average glucose Bld gHb Est-mCnc: 131 mg/dL
Hgb A1c MFr Bld: 6.2 % — ABNORMAL HIGH (ref 4.8–5.6)

## 2020-06-26 LAB — TSH: TSH: 3.37 u[IU]/mL (ref 0.450–4.500)

## 2020-06-27 LAB — COMPREHENSIVE METABOLIC PANEL
ALT: 18 IU/L (ref 0–32)
AST: 25 IU/L (ref 0–40)
Albumin/Globulin Ratio: 1.5 (ref 1.2–2.2)
Albumin: 4.5 g/dL (ref 3.7–4.7)
Alkaline Phosphatase: 117 IU/L (ref 44–121)
BUN/Creatinine Ratio: 23 (ref 12–28)
BUN: 22 mg/dL (ref 8–27)
Bilirubin Total: <0.2 mg/dL (ref 0.0–1.2)
CO2: 24 mmol/L (ref 20–29)
Calcium: 9.8 mg/dL (ref 8.7–10.3)
Chloride: 98 mmol/L (ref 96–106)
Creatinine, Ser: 0.96 mg/dL (ref 0.57–1.00)
GFR calc Af Amer: 69 mL/min/{1.73_m2} (ref >59)
GFR calc non Af Amer: 60 mL/min/{1.73_m2} (ref >59)
Globulin, Total: 3 g/dL (ref 1.5–4.5)
Glucose: 113 mg/dL — ABNORMAL HIGH (ref 65–99)
Potassium: 4.5 mmol/L (ref 3.5–5.2)
Sodium: 135 mmol/L (ref 134–144)
Total Protein: 7.5 g/dL (ref 6.0–8.5)

## 2020-07-02 ENCOUNTER — Ambulatory Visit (INDEPENDENT_AMBULATORY_CARE_PROVIDER_SITE_OTHER): Payer: Medicare Other | Admitting: Nurse Practitioner

## 2020-07-02 ENCOUNTER — Encounter: Payer: Self-pay | Admitting: Nurse Practitioner

## 2020-07-02 ENCOUNTER — Other Ambulatory Visit: Payer: Self-pay

## 2020-07-02 VITALS — BP 160/68 | HR 78 | Temp 96.7°F | Ht 70.0 in | Wt 188.0 lb

## 2020-07-02 DIAGNOSIS — Z6826 Body mass index (BMI) 26.0-26.9, adult: Secondary | ICD-10-CM | POA: Diagnosis not present

## 2020-07-02 DIAGNOSIS — Z23 Encounter for immunization: Secondary | ICD-10-CM | POA: Diagnosis not present

## 2020-07-02 DIAGNOSIS — Z0000 Encounter for general adult medical examination without abnormal findings: Secondary | ICD-10-CM

## 2020-07-02 DIAGNOSIS — E782 Mixed hyperlipidemia: Secondary | ICD-10-CM

## 2020-07-02 NOTE — Progress Notes (Signed)
Subjective:  Patient ID: Susan Roberson, female    DOB: 13-Jun-1951  Age: 69 y.o. MRN: [id number]  Chief Complaint  Patient presents with  . Annual Exam    Medicare Annuall wellness exam    HPI  Susan Roberson is a 69 year old Caucasian female present for Medicare annual wellness exam. She has a past medical history of hypertension, skin cancer, and hyperglycemia. She has received flu and COVID-19 vaccines.Colonoscopy is up-to-date (Dr Melina Copa). She has upcoming DEXA and mammogram screening appointments next month.She has received Pneumo23 vaccine today.  Encounter for general adult medical annual wellness exam Physical ("At Risk" items are starred): Patient's last physical exam was 1 year ago .  Smoking: Life-long non-smoker ;  Physical Activity: Exercises at least 6 times per week ;  Alcohol/Drug Use: Is a social drinker ; No illicit drug use ;  Patient is not afflicted from Stress Incontinence and Urge Incontinence  Safety: reviewed ; Patient wears a seat belt, has smoke detectors, has carbon monoxide detectors, practices appropriate gun safety, and wears sunscreen with extended sun exposure. Dental Care: biannual cleanings, brushes and flosses daily. Ophthalmology/Optometry: Annual visit.  Hearing loss: none Vision impairments: Bilateral IOL implants from 15 years ago  Mammogram and DEXA scheduled for 07/2020  Fall Risk  06/25/2020  Falls in the past year? 0  Number falls in past yr: 0  Injury with Fall? 0     Depression screen PHQ 2/9 06/25/2020  Decreased Interest 0  Down, Depressed, Hopeless 0  PHQ - 2 Score 0       Functional Status Survey:     Social Hx   Social History   Socioeconomic History  . Marital status: Married    Spouse name: Not on file  . Number of children: Not on file  . Years of education: Not on file  . Highest education level: Not on file  Occupational History  . Not on file  Tobacco Use  . Smoking status: Never Smoker  . Smokeless tobacco: Never Used    Vaping Use  . Vaping Use: Never used  Substance and Sexual Activity  . Alcohol use: Yes    Comment: Drinks on a social basis.  . Drug use: No  . Sexual activity: Not on file  Other Topics Concern  . Not on file  Social History Narrative  . Not on file   Social Determinants of Health   Financial Resource Strain:   . Difficulty of Paying Living Expenses: Not on file  Food Insecurity:   . Worried About Charity fundraiser in the Last Year: Not on file  . Ran Out of Food in the Last Year: Not on file  Transportation Needs:   . Lack of Transportation (Medical): Not on file  . Lack of Transportation (Non-Medical): Not on file  Physical Activity:   . Days of Exercise per Week: Not on file  . Minutes of Exercise per Session: Not on file  Stress:   . Feeling of Stress : Not on file  Social Connections:   . Frequency of Communication with Friends and Family: Not on file  . Frequency of Social Gatherings with Friends and Family: Not on file  . Attends Religious Services: Not on file  . Active Member of Clubs or Organizations: Not on file  . Attends Archivist Meetings: Not on file  . Marital Status: Not on file   Past Medical History:  Diagnosis Date  . Arthritis   . Cancer ([place])  pre cancerous skin cancer with multiple lesions removed  . Complication of anesthesia    broken teeth  . Essential (primary) hypertension   . Generalized anxiety disorder   . Hyperkalemia   . Hypertension    Family History  Problem Relation Age of Onset  . Stroke Father   . Cerebrovascular Accident Other   . Diabetes Mellitus II Other   . Hyperlipidemia Other   . Hypertension Other     Review of Systems  Constitutional: Negative for activity change, fatigue and fever.  HENT: Negative for congestion, ear pain, sinus pressure and sore throat.   Eyes: Negative for pain.  Respiratory: Negative for cough, chest tightness, shortness of breath and wheezing.   Cardiovascular: Negative  for chest pain and palpitations.  Gastrointestinal: Negative for abdominal pain, constipation, diarrhea, nausea and vomiting.  Endocrine: Positive for polyuria.  Genitourinary: Positive for frequency. Negative for dysuria and hematuria.  Musculoskeletal: Negative for arthralgias, back pain, joint swelling and myalgias.  Skin: Negative for rash.  Neurological: Negative for dizziness, weakness and headaches.  Psychiatric/Behavioral: Negative for dysphoric mood. The patient is not nervous/anxious.      Objective:  BP (!) 160/68 (BP Location: Left Arm, Patient Position: Sitting)   Pulse 78   Temp (!) 96.7 F (35.9 C) (Temporal)   Ht 5\' 10"  (1.778 m)   Wt 188 lb (85.3 kg)   SpO2 98%   BMI 26.98 kg/m   BP/Weight 07/02/2020 06/25/2020 6/71/2458  Systolic BP 099 833 825  Diastolic BP 68 90 80  Wt. (Lbs) 188 192 190.8  BMI 26.98 27.55 27.38    Physical Exam Vitals reviewed.  Constitutional:      Appearance: Normal appearance.  HENT:     Head: Normocephalic.     Nose: Nose normal.     Mouth/Throat:     Mouth: Mucous membranes are moist.  Cardiovascular:     Rate and Rhythm: Normal rate and regular rhythm.     Pulses: Normal pulses.     Heart sounds: Normal heart sounds.  Pulmonary:     Effort: Pulmonary effort is normal.     Breath sounds: Normal breath sounds.  Musculoskeletal:        General: Normal range of motion.  Skin:    General: Skin is warm and dry.     Capillary Refill: Capillary refill takes less than 2 seconds.  Neurological:     General: No focal deficit present.     Mental Status: She is alert and oriented to person, place, and time.  Psychiatric:        Mood and Affect: Mood normal.        Behavior: Behavior normal.        Thought Content: Thought content normal.        Judgment: Judgment normal.     Lab Results  Component Value Date   WBC 7.5 06/25/2020   HGB 13.2 06/25/2020   HCT 38.9 06/25/2020   PLT 404 06/25/2020   GLUCOSE 113 (H) 06/26/2020    ALT 18 06/26/2020   AST 25 06/26/2020   NA 135 06/26/2020   K 4.5 06/26/2020   CL 98 06/26/2020   CREATININE 0.96 06/26/2020   BUN 22 06/26/2020   CO2 24 06/26/2020   TSH 3.370 06/25/2020   INR 0.91 02/28/2016   HGBA1C 6.2 (H) 06/25/2020      Assessment & Plan:   1. Encounter for Medicare annual wellness exam - Lipid Panel  2. Need for vaccination -  Pneumococcal polysaccharide vaccine 23-valent greater than or equal to 2yo subcutaneous/IM  3. Mixed hyperlipidemia - Lipid Panel  4. BMI 26.0-26.9,adult - Lipid Panel   These are the goals we discussed: Goals   RETURN ADVANCE DIRECTIVE COPIES TO OFFICE      You received Pneumo23 vaccine today (2nd pneumonia shot 1 year) Obtain TDAP at local pharmacy in 1 week Bring copy advance directive for health care power-of -attorney and living will  Continue heart healthy diet and remaining physically active Continue current medications  Keep appointments for DEXA and mammogram  We will call you with lipid panel results  This is a list of the screening recommended for you and due dates:  Health Maintenance  Topic Date Due  .  Hepatitis C: One time screening is recommended by Center for Disease Control  (CDC) for  adults born from [birth year] through 1967.   Never done  . COVID-19 Vaccine (1) Never done  . Tetanus Vaccine  Never done  . Mammogram  Never done  . Colon Cancer Screening  Never done  . DEXA scan (bone density measurement)  Never done  . Pneumonia vaccines (1 of 2 - PCV13) Never done  . Flu Shot  Completed     AN INDIVIDUALIZED CARE PLAN: was established or reinforced today.   SELF MANAGEMENT: The patient and I together assessed ways to personally work towards obtaining the recommended goals  Support needs The patient and/or family needs were assessed and services were offered and not necessary at this time.    My nursing staff have aided in the documentation of this note on the behalf of Rip Harbour, NP,as  directed by  Rip Harbour, NP and thoroughly reviewed by Rip Harbour, NP.  Follow-up: 3-MONTHS  Rip Harbour, NP [place] [phone]

## 2020-07-02 NOTE — Patient Instructions (Addendum)
You received Pneumo23 vaccine today (2nd pneumonia shot 1 year) Obtain TDAP at local pharmacy in 1 week Bring copy advance directive for health care power-of -attorney and living will  Continue heart healthy diet and remaining physically active Continue current medications  Keep appointments for DEXA and mammogram  We will call you with lipid panel results  Preventing Osteoporosis, Adult Osteoporosis is a condition that causes the bones to lose density. This means that the bones become thinner, and the normal spaces in bone tissue become larger. Low bone density can make the bones weak and cause them to break more easily. Osteoporosis cannot always be prevented, but you can take steps to lower your risk of developing this condition. How can this condition affect me? If you develop osteoporosis, you will be more likely to break bones in your wrist, spine, or hip. Even a minor accident or injury can be enough to break weak bones. The bones will also be slower to heal. Osteoporosis can cause other problems as well, such as a stooped posture or trouble with movement. Osteoporosis can occur with aging. As you get older, you may lose bone tissue more quickly, or it may be replaced more slowly. Osteoporosis is more likely to develop if you have poor nutrition or do not get enough calcium or vitamin D. Other lifestyle factors can also play a role. By eating a well-balanced diet and making lifestyle changes, you can help keep your bones strong and healthy, lowering your chances of developing osteoporosis. What can increase my risk? The following factors may make you more likely to develop osteoporosis:  Having a family history of the condition.  Having poor nutrition or not getting enough calcium or vitamin D.  Using certain medicines, such as steroid medicines or antiseizure medicines.  Being any of the following: ? 30 years of age or older. ? Female. ? A woman who has gone through menopause (is  postmenopausal). ? White (Caucasian) or of Asian descent.  Smoking or having a history of smoking.  Not being physically active (being sedentary).  Having a small body frame. What actions can I take to prevent this?  Get enough calcium   Make sure you get enough calcium every day. Calcium is the most important mineral for bone health. Most people can get enough calcium from their diet, but supplements may be recommended for people who are at risk for osteoporosis. Follow these guidelines: ? If you are age 18 or younger, aim to get 1,000 mg of calcium every day. ? If you are older than age 39, aim to get 1,200 mg of calcium every day.  Good sources of calcium include: ? Dairy products, such as low-fat or nonfat milk, cheese, and yogurt. ? Dark green leafy vegetables, such as bok choy and broccoli. ? Foods that have had calcium added to them (calcium-fortified foods), such as orange juice, cereal, bread, soy beverages, and tofu products. ? Nuts, such as almonds.  Check nutrition labels to see how much calcium is in a food or drink. Get enough vitamin D  Try to get enough vitamin D every day. Vitamin D is the most essential vitamin for bone health. It helps the body absorb calcium. Follow these guidelines for how much vitamin D to get from food: ? If you are age 9 or younger, aim to get at least 600 international units (IU) every day. Your health care provider may suggest more. ? If you are older than age 61, aim to get at least  800 international units every day. Your health care provider may suggest more.  Good sources of vitamin D in your diet include: ? Egg yolks. ? Oily fish, such as salmon, sardines, and tuna. ? Milk and cereal fortified with vitamin D.  Your body also makes vitamin D when you are out in the sun. Exposing the bare skin on your face, arms, legs, or back to the sun for no more than 30 minutes a day, 2 times a week is more than enough. Beyond that, make sure you  use sunblock to protect your skin from sunburn, which increases your risk for skin cancer. Exercise  Stay active and get exercise every day.  Ask your health care provider what types of exercise are best for you. Weight-bearing and strength-building activities are important for building and maintaining healthy bones. Some examples of these types of activities include: ? Walking and hiking. ? Jogging and running. ? Dancing. ? Gym exercises. ? Lifting weights. ? Tennis and racquetball. ? Climbing stairs. ? Aerobics. Make other lifestyle changes  Do not use any products that contain nicotine or tobacco, such as cigarettes, e-cigarettes, and chewing tobacco. If you need help quitting, ask your health care provider.  Lose weight if you are overweight.  If you drink alcohol: ? Limit how much you use to:  0-1 drink a day for nonpregnant women.  0-2 drinks a day for men. ? Be aware of how much alcohol is in your drink. In the [place] one drink equals one 12 oz bottle of beer (355 mL), one 5 oz glass of wine (148 mL), or one 1 oz glass of hard liquor (44 mL). Where to find support If you need help making changes to prevent osteoporosis, talk with your health care provider. You can ask for a referral to a diet and nutrition specialist (dietitian) and a physical therapist. Where to find more information Learn more about osteoporosis from:  NIH Osteoporosis and Related [place]: www.bones.SouthExposed.es  [place] Office on [employer] Health: [place].tn  [place]: EquipmentWeekly.com.ee Summary  Osteoporosis is a condition that causes weak bones that are more likely to break.  Eat a healthy diet, making sure you get enough calcium and vitamin D, and stay active by getting regular exercise to help prevent osteoporosis.  Other ways to reduce your risk of osteoporosis include maintaining a healthy weight and avoiding alcohol and products that contain  nicotine or tobacco. This information is not intended to replace advice given to you by your health care provider. Make sure you discuss any questions you have with your health care provider. Document Revised: 03/04/2019 Document Reviewed: 03/04/2019 Elsevier Patient Education  [place].  Bone Density Test The bone density test uses a special type of X-ray to measure the amount of calcium and other minerals in your bones. It can measure bone density in the hip and the spine. The test procedure is similar to having a regular X-ray. This test may also be called:  Bone densitometry.  Bone mineral density test.  Dual-energy X-ray absorptiometry (DEXA). You may have this test to:  Diagnose a condition that causes weak or thin bones (osteoporosis).  Screen you for osteoporosis.  Predict your risk for a broken bone (fracture).  Determine how well your osteoporosis treatment is working. Tell a health care provider about:  Any allergies you have.  All medicines you are taking, including vitamins, herbs, eye drops, creams, and over-the-counter medicines.  Any problems you or family members  have had with anesthetic medicines.  Any blood disorders you have.  Any surgeries you have had.  Any medical conditions you have.  Whether you are pregnant or may be pregnant.  Any medical tests you have had within the past 14 days that used contrast material. What are the risks? Generally, this is a safe procedure. However, it does expose you to a small amount of radiation, which can slightly increase your cancer risk. What happens before the procedure?  Do not take any calcium supplements starting 24 hours before your test.  Remove all metal jewelry, eyeglasses, dental appliances, and any other metal objects. What happens during the procedure?   You will lie down on an exam table. There will be an X-ray generator below you and an imaging device above you.  Other devices, such as  boxes or braces, may be used to position your body properly for the scan.  The machine will slowly scan your body. You will need to keep still.  The images will show up on a screen in the room. Images will be examined by a specialist after your test is done. The procedure may vary among health care providers and hospitals. What happens after the procedure?  It is up to you to get your test results. Ask your health care provider, or the department that is doing the test, when your results will be ready. Summary  A bone density test is an imaging test that uses a type of X-ray to measure the amount of calcium and other minerals in your bones.  The test may be used to diagnose or screen you for a condition that causes weak or thin bones (osteoporosis), predict your risk for a broken bone (fracture), or determine how well your osteoporosis treatment is working.  Do not take any calcium supplements starting 24 hours before your test.  Ask your health care provider, or the department that is doing the test, when your results will be ready. This information is not intended to replace advice given to you by your health care provider. Make sure you discuss any questions you have with your health care provider. Document Revised: 08/20/2017 Document Reviewed: 06/08/2017 Elsevier Patient Education  [place].  Pneumococcal Vaccine, Polyvalent solution for injection What is this medicine? PNEUMOCOCCAL VACCINE, POLYVALENT (NEU mo KOK al vak SEEN, pol ee VEY luhnt) is a vaccine to prevent pneumococcus bacteria infection. These bacteria are a major cause of ear infections, Strep throat infections, and serious pneumonia, meningitis, or blood infections worldwide. These vaccines help the body to produce antibodies (protective substances) that help your body defend against these bacteria. This vaccine is recommended for people 68 years of age and older with health problems. It is also recommended for  all adults over 107 years old. This vaccine will not treat an infection. This medicine may be used for other purposes; ask your health care provider or pharmacist if you have questions. COMMON BRAND NAME(S): Pneumovax 23 What should I tell my health care provider before I take this medicine? They need to know if you have any of these conditions:  bleeding problems  bone marrow or organ transplant  cancer, Hodgkin's disease  fever  infection  immune system problems  low platelet count in the blood  seizures  an unusual or allergic reaction to pneumococcal vaccine, diphtheria toxoid, other vaccines, latex, other medicines, foods, dyes, or preservatives  pregnant or trying to get pregnant  breast-feeding How should I use this medicine? This vaccine is  for injection into a muscle or under the skin. It is given by a health care professional. A copy of Vaccine Information Statements will be given before each vaccination. Read this sheet carefully each time. The sheet may change frequently. Talk to your pediatrician regarding the use of this medicine in children. While this drug may be prescribed for children as young as 40 years of age for selected conditions, precautions do apply. Overdosage: If you think you have taken too much of this medicine contact a poison control center or emergency room at once. NOTE: This medicine is only for you. Do not share this medicine with others. What if I miss a dose? It is important not to miss your dose. Call your doctor or health care professional if you are unable to keep an appointment. What may interact with this medicine?  medicines for cancer chemotherapy  medicines that suppress your immune function  medicines that treat or prevent blood clots like warfarin, enoxaparin, and dalteparin  steroid medicines like prednisone or cortisone This list may not describe all possible interactions. Give your health care provider a list of all the  medicines, herbs, non-prescription drugs, or dietary supplements you use. Also tell them if you smoke, drink alcohol, or use illegal drugs. Some items may interact with your medicine. What should I watch for while using this medicine? Mild fever and pain should go away in 3 days or less. Report any unusual symptoms to your doctor or health care professional. What side effects may I notice from receiving this medicine? Side effects that you should report to your doctor or health care professional as soon as possible:  allergic reactions like skin rash, itching or hives, swelling of the face, lips, or tongue  breathing problems  confused  fever over 102 degrees F  pain, tingling, numbness in the hands or feet  seizures  unusual bleeding or bruising  unusual muscle weakness Side effects that usually do not require medical attention (report to your doctor or health care professional if they continue or are bothersome):  aches and pains  diarrhea  fever of 102 degrees F or less  headache  irritable  loss of appetite  pain, tender at site where injected  trouble sleeping This list may not describe all possible side effects. Call your doctor for medical advice about side effects. You may report side effects to FDA at 1-800-FDA-1088. Where should I keep my medicine? This does not apply. This vaccine is given in a clinic, pharmacy, doctor's office, or other health care setting and will not be stored at home. NOTE: This sheet is a summary. It may not cover all possible information. If you have questions about this medicine, talk to your doctor, pharmacist, or health care provider.  2020 Elsevier/Gold Standard (2008-03-10 14:32:37) Managing Your Hypertension Hypertension is commonly called high blood pressure. This is when the force of your blood pressing against the walls of your arteries is too strong. Arteries are blood vessels that carry blood from your heart throughout your  body. Hypertension forces the heart to work harder to pump blood, and may cause the arteries to become narrow or stiff. Having untreated or uncontrolled hypertension can cause heart attack, stroke, kidney disease, and other problems. What are blood pressure readings? A blood pressure reading consists of a higher number over a lower number. Ideally, your blood pressure should be below 120/80. The first ("top") number is called the systolic pressure. It is a measure of the pressure in your arteries  as your heart beats. The second ("bottom") number is called the diastolic pressure. It is a measure of the pressure in your arteries as the heart relaxes. What does my blood pressure reading mean? Blood pressure is classified into four stages. Based on your blood pressure reading, your health care provider may use the following stages to determine what type of treatment you need, if any. Systolic pressure and diastolic pressure are measured in a unit called mm Hg. Normal  Systolic pressure: below 425.  Diastolic pressure: below 80. Elevated  Systolic pressure: 956-387.  Diastolic pressure: below 80. Hypertension stage 1  Systolic pressure: 564-332.  Diastolic pressure: 95-18. Hypertension stage 2  Systolic pressure: 841 or above.  Diastolic pressure: 90 or above. What health risks are associated with hypertension? Managing your hypertension is an important responsibility. Uncontrolled hypertension can lead to:  A heart attack.  A stroke.  A weakened blood vessel (aneurysm).  Heart failure.  Kidney damage.  Eye damage.  Metabolic syndrome.  Memory and concentration problems. What changes can I make to manage my hypertension? Hypertension can be managed by making lifestyle changes and possibly by taking medicines. Your health care provider will help you make a plan to bring your blood pressure within a normal range. Eating and drinking   Eat a diet that is high in fiber and  potassium, and low in salt (sodium), added sugar, and fat. An example eating plan is called the DASH (Dietary Approaches to Stop Hypertension) diet. To eat this way: ? Eat plenty of fresh fruits and vegetables. Try to fill half of your plate at each meal with fruits and vegetables. ? Eat whole grains, such as whole wheat pasta, brown rice, or whole grain bread. Fill about one quarter of your plate with whole grains. ? Eat low-fat diary products. ? Avoid fatty cuts of meat, processed or cured meats, and poultry with skin. Fill about one quarter of your plate with lean proteins such as fish, chicken without skin, beans, eggs, and tofu. ? Avoid premade and processed foods. These tend to be higher in sodium, added sugar, and fat.  Reduce your daily sodium intake. Most people with hypertension should eat less than 1,500 mg of sodium a day.  Limit alcohol intake to no more than 1 drink a day for nonpregnant women and 2 drinks a day for men. One drink equals 12 oz of beer, 5 oz of wine, or 1 oz of hard liquor. Lifestyle  Work with your health care provider to maintain a healthy body weight, or to lose weight. Ask what an ideal weight is for you.  Get at least 30 minutes of exercise that causes your heart to beat faster (aerobic exercise) most days of the week. Activities may include walking, swimming, or biking.  Include exercise to strengthen your muscles (resistance exercise), such as weight lifting, as part of your weekly exercise routine. Try to do these types of exercises for 30 minutes at least 3 days a week.  Do not use any products that contain nicotine or tobacco, such as cigarettes and e-cigarettes. If you need help quitting, ask your health care provider.  Control any long-term (chronic) conditions you have, such as high cholesterol or diabetes. Monitoring  Monitor your blood pressure at home as told by your health care provider. Your personal target blood pressure may vary depending on  your medical conditions, your age, and other factors.  Have your blood pressure checked regularly, as often as told by your health care  provider. Working with your health care provider  Review all the medicines you take with your health care provider because there may be side effects or interactions.  Talk with your health care provider about your diet, exercise habits, and other lifestyle factors that may be contributing to hypertension.  Visit your health care provider regularly. Your health care provider can help you create and adjust your plan for managing hypertension. Will I need medicine to control my blood pressure? Your health care provider may prescribe medicine if lifestyle changes are not enough to get your blood pressure under control, and if:  Your systolic blood pressure is 130 or higher.  Your diastolic blood pressure is 80 or higher. Take medicines only as told by your health care provider. Follow the directions carefully. Blood pressure medicines must be taken as prescribed. The medicine does not work as well when you skip doses. Skipping doses also puts you at risk for problems. Contact a health care provider if:  You think you are having a reaction to medicines you have taken.  You have repeated (recurrent) headaches.  You feel dizzy.  You have swelling in your ankles.  You have trouble with your vision. Get help right away if:  You develop a severe headache or confusion.  You have unusual weakness or numbness, or you feel faint.  You have severe pain in your chest or abdomen.  You vomit repeatedly.  You have trouble breathing. Summary  Hypertension is when the force of blood pumping through your arteries is too strong. If this condition is not controlled, it may put you at risk for serious complications.  Your personal target blood pressure may vary depending on your medical conditions, your age, and other factors. For most people, a normal blood  pressure is less than 120/80.  Hypertension is managed by lifestyle changes, medicines, or both. Lifestyle changes include weight loss, eating a healthy, low-sodium diet, exercising more, and limiting alcohol. This information is not intended to replace advice given to you by your health care provider. Make sure you discuss any questions you have with your health care provider. Document Revised: 11/26/2018 Document Reviewed: 07/02/2016 Elsevier Patient Education  [place] DASH stands for "Dietary Approaches to Stop Hypertension." The DASH eating plan is a healthy eating plan that has been shown to reduce high blood pressure (hypertension). It may also reduce your risk for type 2 diabetes, heart disease, and stroke. The DASH eating plan may also help with weight loss. What are tips for following this plan?  General guidelines  Avoid eating more than 2,300 mg (milligrams) of salt (sodium) a day. If you have hypertension, you may need to reduce your sodium intake to 1,500 mg a day.  Limit alcohol intake to no more than 1 drink a day for nonpregnant women and 2 drinks a day for men. One drink equals 12 oz of beer, 5 oz of wine, or 1 oz of hard liquor.  Work with your health care provider to maintain a healthy body weight or to lose weight. Ask what an ideal weight is for you.  Get at least 30 minutes of exercise that causes your heart to beat faster (aerobic exercise) most days of the week. Activities may include walking, swimming, or biking.  Work with your health care provider or diet and nutrition specialist (dietitian) to adjust your eating plan to your individual calorie needs. Reading food labels   Check food labels for the amount of sodium per  serving. Choose foods with less than 5 percent of the Daily Value of sodium. Generally, foods with less than 300 mg of sodium per serving fit into this eating plan.  To find whole grains, look for the word "whole" as  the first word in the ingredient list. Shopping  Buy products labeled as "low-sodium" or "no salt added."  Buy fresh foods. Avoid canned foods and premade or frozen meals. Cooking  Avoid adding salt when cooking. Use salt-free seasonings or herbs instead of table salt or sea salt. Check with your health care provider or pharmacist before using salt substitutes.  Do not fry foods. Cook foods using healthy methods such as baking, boiling, grilling, and broiling instead.  Cook with heart-healthy oils, such as olive, canola, soybean, or sunflower oil. Meal planning  Eat a balanced diet that includes: ? 5 or more servings of fruits and vegetables each day. At each meal, try to fill half of your plate with fruits and vegetables. ? Up to 6-8 servings of whole grains each day. ? Less than 6 oz of lean meat, poultry, or fish each day. A 3-oz serving of meat is about the same size as a deck of cards. One egg equals 1 oz. ? 2 servings of low-fat dairy each day. ? A serving of nuts, seeds, or beans 5 times each week. ? Heart-healthy fats. Healthy fats called Omega-3 fatty acids are found in foods such as flaxseeds and coldwater fish, like sardines, salmon, and mackerel.  Limit how much you eat of the following: ? Canned or prepackaged foods. ? Food that is high in trans fat, such as fried foods. ? Food that is high in saturated fat, such as fatty meat. ? Sweets, desserts, sugary drinks, and other foods with added sugar. ? Full-fat dairy products.  Do not salt foods before eating.  Try to eat at least 2 vegetarian meals each week.  Eat more home-cooked food and less restaurant, buffet, and fast food.  When eating at a restaurant, ask that your food be prepared with less salt or no salt, if possible. What foods are recommended? The items listed may not be a complete list. Talk with your dietitian about what dietary choices are best for you. Grains Whole-grain or whole-wheat bread.  Whole-grain or whole-wheat pasta. Brown rice. [place]. Bulgur. Whole-grain and low-sodium cereals. Pita bread. Low-fat, low-sodium crackers. Whole-wheat flour tortillas. Vegetables Fresh or frozen vegetables (raw, steamed, roasted, or grilled). Low-sodium or reduced-sodium tomato and vegetable juice. Low-sodium or reduced-sodium tomato sauce and tomato paste. Low-sodium or reduced-sodium canned vegetables. Fruits All fresh, dried, or frozen fruit. Canned fruit in natural juice (without added sugar). Meat and other protein foods Skinless chicken or [place]. Ground chicken or [place]. Pork with fat trimmed off. Fish and seafood. Egg whites. Dried beans, peas, or lentils. Unsalted nuts, nut butters, and seeds. Unsalted canned beans. Lean cuts of beef with fat trimmed off. Low-sodium, lean deli meat. Dairy Low-fat (1%) or fat-free (skim) milk. Fat-free, low-fat, or reduced-fat cheeses. Nonfat, low-sodium ricotta or cottage cheese. Low-fat or nonfat yogurt. Low-fat, low-sodium cheese. Fats and oils Soft margarine without trans fats. Vegetable oil. Low-fat, reduced-fat, or light mayonnaise and salad dressings (reduced-sodium). Canola, safflower, olive, soybean, and sunflower oils. Avocado. Seasoning and other foods Herbs. Spices. Seasoning mixes without salt. Unsalted popcorn and pretzels. Fat-free sweets. What foods are not recommended? The items listed may not be a complete list. Talk with your dietitian about what dietary choices are best for you. Grains Baked  goods made with fat, such as croissants, muffins, or some breads. Dry pasta or rice meal packs. Vegetables Creamed or fried vegetables. Vegetables in a cheese sauce. Regular canned vegetables (not low-sodium or reduced-sodium). Regular canned tomato sauce and paste (not low-sodium or reduced-sodium). Regular tomato and vegetable juice (not low-sodium or reduced-sodium). Angie Fava. Olives. Fruits Canned fruit in a light or heavy syrup.  Fried fruit. Fruit in cream or butter sauce. Meat and other protein foods Fatty cuts of meat. Ribs. Fried meat. Berniece Salines. Sausage. Bologna and other processed lunch meats. Salami. Fatback. Hotdogs. Bratwurst. Salted nuts and seeds. Canned beans with added salt. Canned or smoked fish. Whole eggs or egg yolks. Chicken or [place] with skin. Dairy Whole or 2% milk, cream, and half-and-half. Whole or full-fat cream cheese. Whole-fat or sweetened yogurt. Full-fat cheese. Nondairy creamers. Whipped toppings. Processed cheese and cheese spreads. Fats and oils Butter. Stick margarine. Lard. Shortening. Ghee. Bacon fat. Tropical oils, such as coconut, palm kernel, or palm oil. Seasoning and other foods Salted popcorn and pretzels. Onion salt, garlic salt, seasoned salt, table salt, and sea salt. Worcestershire sauce. Tartar sauce. Barbecue sauce. Teriyaki sauce. Soy sauce, including reduced-sodium. Steak sauce. Canned and packaged gravies. Fish sauce. Oyster sauce. Cocktail sauce. Horseradish that you find on the shelf. Ketchup. Mustard. Meat flavorings and tenderizers. Bouillon cubes. Hot sauce and Tabasco sauce. Premade or packaged marinades. Premade or packaged taco seasonings. Relishes. Regular salad dressings. Where to find more information:  National Heart, Lung, and [place]: https://wilson-eaton.com/  American Heart Association: www.heart.org Summary  The DASH eating plan is a healthy eating plan that has been shown to reduce high blood pressure (hypertension). It may also reduce your risk for type 2 diabetes, heart disease, and stroke.  With the DASH eating plan, you should limit salt (sodium) intake to 2,300 mg a day. If you have hypertension, you may need to reduce your sodium intake to 1,500 mg a day.  When on the DASH eating plan, aim to eat more fresh fruits and vegetables, whole grains, lean proteins, low-fat dairy, and heart-healthy fats.  Work with your health care provider or diet and  nutrition specialist (dietitian) to adjust your eating plan to your individual calorie needs. This information is not intended to replace advice given to you by your health care provider. Make sure you discuss any questions you have with your health care provider. Document Revised: 07/17/2017 Document Reviewed: 07/28/2016 Elsevier Patient Education  [place] Prevention in the Home, Adult Falls can cause injuries. They can happen to people of all ages. There are many things you can do to make your home safe and to help prevent falls. Ask for help when making these changes, if needed. What actions can I take to prevent falls? General Instructions  Use good lighting in all rooms. Replace any light bulbs that burn out.  Turn on the lights when you go into a dark area. Use night-lights.  Keep items that you use often in easy-to-reach places. Lower the shelves around your home if necessary.  Set up your furniture so you have a clear path. Avoid moving your furniture around.  Do not have throw rugs and other things on the floor that can make you trip.  Avoid walking on wet floors.  If any of your floors are uneven, fix them.  Add color or contrast paint or tape to clearly mark and help you see: ? Any grab bars or handrails. ? First and last steps of stairways. ?  Where the edge of each step is.  If you use a stepladder: ? Make sure that it is fully opened. Do not climb a closed stepladder. ? Make sure that both sides of the stepladder are locked into place. ? Ask someone to hold the stepladder for you while you use it.  If there are any pets around you, be aware of where they are. What can I do in the bathroom?      Keep the floor dry. Clean up any water that spills onto the floor as soon as it happens.  Remove soap buildup in the tub or shower regularly.  Use non-skid mats or decals on the floor of the tub or shower.  Attach bath mats securely with double-sided,  non-slip rug tape.  If you need to sit down in the shower, use a plastic, non-slip stool.  Install grab bars by the toilet and in the tub and shower. Do not use towel bars as grab bars. What can I do in the bedroom?  Make sure that you have a light by your bed that is easy to reach.  Do not use any sheets or blankets that are too big for your bed. They should not hang down onto the floor.  Have a firm chair that has side arms. You can use this for support while you get dressed. What can I do in the kitchen?  Clean up any spills right away.  If you need to reach something above you, use a strong step stool that has a grab bar.  Keep electrical cords out of the way.  Do not use floor polish or wax that makes floors slippery. If you must use wax, use non-skid floor wax. What can I do with my stairs?  Do not leave any items on the stairs.  Make sure that you have a light switch at the top of the stairs and the bottom of the stairs. If you do not have them, ask someone to add them for you.  Make sure that there are handrails on both sides of the stairs, and use them. Fix handrails that are broken or loose. Make sure that handrails are as long as the stairways.  Install non-slip stair treads on all stairs in your home.  Avoid having throw rugs at the top or bottom of the stairs. If you do have throw rugs, attach them to the floor with carpet tape.  Choose a carpet that does not hide the edge of the steps on the stairway.  Check any carpeting to make sure that it is firmly attached to the stairs. Fix any carpet that is loose or worn. What can I do on the outside of my home?  Use bright outdoor lighting.  Regularly fix the edges of walkways and driveways and fix any cracks.  Remove anything that might make you trip as you walk through a door, such as a raised step or threshold.  Trim any bushes or trees on the path to your home.  Regularly check to see if handrails are loose or  broken. Make sure that both sides of any steps have handrails.  Install guardrails along the edges of any raised decks and porches.  Clear walking paths of anything that might make someone trip, such as tools or rocks.  Have any leaves, snow, or ice cleared regularly.  Use sand or salt on walking paths during winter.  Clean up any spills in your garage right away. This includes grease or oil  spills. What other actions can I take?  Wear shoes that: ? Have a low heel. Do not wear high heels. ? Have rubber bottoms. ? Are comfortable and fit you well. ? Are closed at the toe. Do not wear open-toe sandals.  Use tools that help you move around (mobility aids) if they are needed. These include: ? Canes. ? Walkers. ? Scooters. ? Crutches.  Review your medicines with your doctor. Some medicines can make you feel dizzy. This can increase your chance of falling. Ask your doctor what other things you can do to help prevent falls. Where to find more information  Centers for Disease Control and Prevention, STEADI: https://garcia.biz/  [employer] on Aging: BrainJudge.co.uk Contact a doctor if:  You are afraid of falling at home.  You feel weak, drowsy, or dizzy at home.  You fall at home. Summary  There are many simple things that you can do to make your home safe and to help prevent falls.  Ways to make your home safe include removing tripping hazards and installing grab bars in the bathroom.  Ask for help when making these changes in your home. This information is not intended to replace advice given to you by your health care provider. Make sure you discuss any questions you have with your health care provider. Document Revised: 11/25/2018 Document Reviewed: 03/19/2017 Elsevier Patient Education  2020 [place] 65 Years and Older, Female Preventive care refers to lifestyle choices and visits with your health care provider that can promote  health and wellness. This includes:  A yearly physical exam. This is also called an annual well check.  Regular dental and eye exams.  Immunizations.  Screening for certain conditions.  Healthy lifestyle choices, such as diet and exercise. What can I expect for my preventive care visit? Physical exam Your health care provider will check:  Height and weight. These may be used to calculate body mass index (BMI), which is a measurement that tells if you are at a healthy weight.  Heart rate and blood pressure.  Your skin for abnormal spots. Counseling Your health care provider may ask you questions about:  Alcohol, tobacco, and drug use.  Emotional well-being.  Home and relationship well-being.  Sexual activity.  Eating habits.  History of falls.  Memory and ability to understand (cognition).  Work and work Statistician.  Pregnancy and menstrual history. What immunizations do I need?  Influenza (flu) vaccine  This is recommended every year. Tetanus, diphtheria, and pertussis (Tdap) vaccine  You may need a Td booster every 10 years. Varicella (chickenpox) vaccine  You may need this vaccine if you have not already been vaccinated. Zoster (shingles) vaccine  You may need this after age 36. Pneumococcal conjugate (PCV13) vaccine  One dose is recommended after age 24. Pneumococcal polysaccharide (PPSV23) vaccine  One dose is recommended after age 62. Measles, mumps, and rubella (MMR) vaccine  You may need at least one dose of MMR if you were born in 1957 or later. You may also need a second dose. Meningococcal conjugate (MenACWY) vaccine  You may need this if you have certain conditions. Hepatitis A vaccine  You may need this if you have certain conditions or if you travel or work in places where you may be exposed to hepatitis A. Hepatitis B vaccine  You may need this if you have certain conditions or if you travel or work in places where you may be  exposed to hepatitis B. Haemophilus influenzae type b (  Hib) vaccine  You may need this if you have certain conditions. You may receive vaccines as individual doses or as more than one vaccine together in one shot (combination vaccines). Talk with your health care provider about the risks and benefits of combination vaccines. What tests do I need? Blood tests  Lipid and cholesterol levels. These may be checked every 5 years, or more frequently depending on your overall health.  Hepatitis C test.  Hepatitis B test. Screening  Lung cancer screening. You may have this screening every year starting at age 12 if you have a 30-pack-year history of smoking and currently smoke or have quit within the past 15 years.  Colorectal cancer screening. All adults should have this screening starting at age 71 and continuing until age 25. Your health care provider may recommend screening at age 66 if you are at increased risk. You will have tests every 1-10 years, depending on your results and the type of screening test.  Diabetes screening. This is done by checking your blood sugar (glucose) after you have not eaten for a while (fasting). You may have this done every 1-3 years.  Mammogram. This may be done every 1-2 years. Talk with your health care provider about how often you should have regular mammograms.  BRCA-related cancer screening. This may be done if you have a family history of breast, ovarian, tubal, or peritoneal cancers. Other tests  Sexually transmitted disease (STD) testing.  Bone density scan. This is done to screen for osteoporosis. You may have this done starting at age 59. Follow these instructions at home: Eating and drinking  Eat a diet that includes fresh fruits and vegetables, whole grains, lean protein, and low-fat dairy products. Limit your intake of foods with high amounts of sugar, saturated fats, and salt.  Take vitamin and mineral supplements as recommended by your  health care provider.  Do not drink alcohol if your health care provider tells you not to drink.  If you drink alcohol: ? Limit how much you have to 0-1 drink a day. ? Be aware of how much alcohol is in your drink. In the [place] one drink equals one 12 oz bottle of beer (355 mL), one 5 oz glass of wine (148 mL), or one 1 oz glass of hard liquor (44 mL). Lifestyle  Take daily care of your teeth and gums.  Stay active. Exercise for at least 30 minutes on 5 or more days each week.  Do not use any products that contain nicotine or tobacco, such as cigarettes, e-cigarettes, and chewing tobacco. If you need help quitting, ask your health care provider.  If you are sexually active, practice safe sex. Use a condom or other form of protection in order to prevent STIs (sexually transmitted infections).  Talk with your health care provider about taking a low-dose aspirin or statin. What's next?  Go to your health care provider once a year for a well check visit.  Ask your health care provider how often you should have your eyes and teeth checked.  Stay up to date on all vaccines. This information is not intended to replace advice given to you by your health care provider. Make sure you discuss any questions you have with your health care provider. Document Revised: 07/29/2018 Document Reviewed: 07/29/2018 Elsevier Patient Education  2020 [employer].

## 2020-07-03 ENCOUNTER — Other Ambulatory Visit: Payer: Self-pay | Admitting: Nurse Practitioner

## 2020-07-03 DIAGNOSIS — E782 Mixed hyperlipidemia: Secondary | ICD-10-CM

## 2020-07-03 LAB — LIPID PANEL
Chol/HDL Ratio: 4.7 ratio — ABNORMAL HIGH (ref 0.0–4.4)
Cholesterol, Total: 287 mg/dL — ABNORMAL HIGH (ref 100–199)
HDL: 61 mg/dL (ref >39)
LDL Chol Calc (NIH): 194 mg/dL — ABNORMAL HIGH (ref 0–99)
Triglycerides: 170 mg/dL — ABNORMAL HIGH (ref 0–149)
VLDL Cholesterol Cal: 32 mg/dL (ref 5–40)

## 2020-07-03 LAB — CARDIOVASCULAR RISK ASSESSMENT

## 2020-07-03 MED ORDER — ROSUVASTATIN CALCIUM 5 MG PO TABS: 5.0000 mg | ORAL_TABLET | Freq: Every day | ORAL | 3 refills | Status: DC

## 2020-08-02 ENCOUNTER — Encounter: Payer: Self-pay | Admitting: Nurse Practitioner

## 2020-08-09 ENCOUNTER — Other Ambulatory Visit: Payer: Self-pay | Admitting: Nurse Practitioner

## 2020-08-09 ENCOUNTER — Telehealth: Payer: Self-pay

## 2020-08-09 MED ORDER — LOVASTATIN 20 MG PO TABS: 20.0000 mg | ORAL_TABLET | Freq: Every day | ORAL | 0 refills | Status: DC

## 2020-08-09 NOTE — Telephone Encounter (Signed)
Pt called requesting a switch in cholesterol medication. States she has been having muscle cramps for around 3 weeks. She spoke with a "nurse friend" who states it could be the new cholesterol medication. "It was started 3 weeks ago." Please advise.

## 2020-09-07 ENCOUNTER — Other Ambulatory Visit: Payer: Self-pay | Admitting: Legal Medicine

## 2020-10-08 ENCOUNTER — Ambulatory Visit: Payer: Medicare Other | Admitting: Nurse Practitioner

## 2020-10-15 ENCOUNTER — Ambulatory Visit (INDEPENDENT_AMBULATORY_CARE_PROVIDER_SITE_OTHER): Payer: Medicare Other | Admitting: Nurse Practitioner

## 2020-10-15 ENCOUNTER — Other Ambulatory Visit: Payer: Self-pay

## 2020-10-15 ENCOUNTER — Encounter: Payer: Self-pay | Admitting: Nurse Practitioner

## 2020-10-15 VITALS — BP 138/86 | HR 66 | Temp 97.4°F | Ht 70.0 in | Wt 186.6 lb

## 2020-10-15 DIAGNOSIS — I10 Essential (primary) hypertension: Secondary | ICD-10-CM | POA: Diagnosis not present

## 2020-10-15 DIAGNOSIS — M1991 Primary osteoarthritis, unspecified site: Secondary | ICD-10-CM

## 2020-10-15 DIAGNOSIS — Z6826 Body mass index (BMI) 26.0-26.9, adult: Secondary | ICD-10-CM

## 2020-10-15 DIAGNOSIS — E782 Mixed hyperlipidemia: Secondary | ICD-10-CM | POA: Diagnosis not present

## 2020-10-15 NOTE — Progress Notes (Signed)
Established Patient Office Visit  Subjective:  Patient ID: Susan Roberson, female    DOB: 04/18/1951  Age: 70 y.o. MRN: [id number]  CC:  Chief Complaint  Patient presents with  . Hypertension    70M F/U    HPI Susan Roberson, "Susan Roberson is a 70 year old Caucasian female that presents for follow-up of hypertension, hyperlipidemia, and osteoarthritis. Susan Roberson tells me she has a healing lesion/wound to right lower anterior leg. States she had a skin cancer lesion removed approximately 1-year ago with Dermatology and [facility] on [address]. She states she was given a steroid cream by the dermatologist that is helping with pruritis and they are assessing the wound for healing.  Hypertension Pt presents for follow up of hypertension. Patient was diagnosed several years ago. Current treatment includes Lotensin/HCTZ 20/25 daily. BP 138/86 today in office.The patient is tolerating the medication well without side effects. She denies chest pain, dyspnea, headache, or dizziness. Compliance with treatment has been good; including taking medication as directed , maintains a healthy diet and regular exercise regimen , and following up as directed.  Hyperlipidemia Susan Roberson has a history of hyperlipidemia since 2021. She has been prescribed Crestor 5 mg and Lovastatin 20 mg  but was unable to tolerate medications due to myalgias and arthralgias. Current treatment includes heart healthy diet, physical activity, and OTC fish oil. Last lipid panel on 07/02/20 reveals TC 287, Trig 170, HDL 61, and LDL 194 She has declined another statin prescription due to fear of side effects. She tells me she has decreased concentrated sweets, snacking between meals, and increased physical activity significantly since last labs.  Fasting lipid panel drawn today.   Osteoarthritis Susan Roberson has a history of OA for several years. Current treatment includes Celebrex 200 mg daily. She has undergone right total hip and knee arthroplasties. She  participates in physical activity daily. Hula-hooping is her favorite activity. She states OA pain is well-controlled.       Past Medical History:  Diagnosis Date  . Arthritis   . Cancer ([place])    pre cancerous skin cancer with multiple lesions removed  . Complication of anesthesia    broken teeth  . Essential (primary) hypertension   . Generalized anxiety disorder   . Hyperkalemia   . Hypertension     Past Surgical History:  Procedure Laterality Date  . SKIN CANCER EXCISION Bilateral    legs  . TOTAL HIP ARTHROPLASTY Right    5 yrs ago  . TOTAL KNEE ARTHROPLASTY Right 03/10/2016   Procedure: RIGHT TOTAL KNEE ARTHROPLASTY;  Surgeon: Vickey Huger, MD;  Location: [facility];  Service: Orthopedics;  Laterality: Right;    Family History  Problem Relation Age of Onset  . Stroke Father   . Cerebrovascular Accident Other   . Diabetes Mellitus II Other   . Hyperlipidemia Other   . Hypertension Other     Social History   Socioeconomic History  . Marital status: Married    Spouse name: Not on file  . Number of children: Not on file  . Years of education: Not on file  . Highest education level: Not on file  Occupational History  . Not on file  Tobacco Use  . Smoking status: Never Smoker  . Smokeless tobacco: Never Used  Vaping Use  . Vaping Use: Never used  Substance and Sexual Activity  . Alcohol use: Yes    Comment: Drinks on a social basis.  . Drug use: No  . Sexual activity: Not on  file  Other Topics Concern  . Not on file  Social History Narrative  . Not on file   Social Determinants of Health   Financial Resource Strain: Low Risk   . Difficulty of Paying Living Expenses: Not hard at all  Food Insecurity: No Food Insecurity  . Worried About Charity fundraiser in the Last Year: Never true  . Ran Out of Food in the Last Year: Never true  Transportation Needs: No Transportation Needs  . Lack of Transportation (Medical): No  . Lack of Transportation  (Non-Medical): No  Physical Activity: Sufficiently Active  . Days of Exercise per Week: 6 days  . Minutes of Exercise per Session: 30 min  Stress: No Stress Concern Present  . Feeling of Stress : Not at all  Social Connections: Socially Integrated  . Frequency of Communication with Friends and Family: More than three times a week  . Frequency of Social Gatherings with Friends and Family: More than three times a week  . Attends Religious Services: More than 4 times per year  . Active Member of Clubs or Organizations: Yes  . Attends Archivist Meetings: More than 4 times per year  . Marital Status: Married  Human resources officer Violence: Not At Risk  . Fear of Current or Ex-Partner: No  . Emotionally Abused: No  . Physically Abused: No  . Sexually Abused: No    Outpatient Medications Prior to Visit  Medication Sig Dispense Refill  . benazepril-hydrochlorthiazide (LOTENSIN HCT) 20-25 MG tablet Take 1 tablet by mouth daily.    . celecoxib (CELEBREX) 200 MG capsule TAKE 1 CAPSULE BY MOUTH DAILY. 90 capsule 2  . LORazepam (ATIVAN) 0.5 MG tablet Take 1 tablet (0.5 mg total) by mouth 3 (three) times daily as needed for anxiety. 90 tablet 3  . lovastatin (MEVACOR) 20 MG tablet Take 1 tablet (20 mg total) by mouth at bedtime. 90 tablet 0  . mupirocin ointment (BACTROBAN) 2 % Apply topically 2 (two) times daily.    . pramipexole (MIRAPEX) 0.25 MG tablet TAKE 1 TABLET BY MOUTH EVERY NIGHT AT BEDTIME 90 tablet 2   No facility-administered medications prior to visit.    Allergies  Allergen Reactions  . Prednisone     Caused bilateral cataracts immediately  . No Known Allergies     ROS Review of Systems  Constitutional: Negative for appetite change, fatigue and unexpected weight change.  HENT: Negative for congestion, ear pain, rhinorrhea, sinus pressure, sinus pain and tinnitus.   Eyes: Negative for pain.  Respiratory: Negative for cough and shortness of breath.   Cardiovascular:  Negative for chest pain, palpitations and leg swelling.  Gastrointestinal: Negative for abdominal pain, constipation, diarrhea, nausea and vomiting.  Endocrine: Negative for cold intolerance, heat intolerance, polydipsia, polyphagia and polyuria.  Genitourinary: Negative for dysuria, frequency and hematuria.  Musculoskeletal: Positive for arthralgias (OA). Negative for back pain, joint swelling and myalgias.  Skin: Positive for rash (right lower shin) and wound (right lower shin).       Right lower anterior calf  Allergic/Immunologic: Negative for environmental allergies.  Neurological: Negative for dizziness and headaches.  Hematological: Negative for adenopathy.  Psychiatric/Behavioral: Negative for decreased concentration and sleep disturbance. The patient is not nervous/anxious.       Objective:    Physical Exam Vitals reviewed.  Constitutional:      Appearance: Normal appearance.  HENT:     Head: Normocephalic.     Right Ear: Tympanic membrane normal.  Left Ear: Tympanic membrane normal.     Nose: Nose normal.     Mouth/Throat:     Mouth: Mucous membranes are moist.  Eyes:     Pupils: Pupils are equal, round, and reactive to light.  Cardiovascular:     Rate and Rhythm: Normal rate and regular rhythm.     Pulses: Normal pulses.     Heart sounds: Normal heart sounds.  Pulmonary:     Effort: Pulmonary effort is normal.     Breath sounds: Normal breath sounds.  Abdominal:     General: Bowel sounds are normal.     Palpations: Abdomen is soft.     Tenderness: There is no guarding.  Musculoskeletal:        General: Tenderness present. No swelling.     Cervical back: Neck supple.  Lymphadenopathy:     Cervical: No cervical adenopathy.  Skin:    General: Skin is warm and dry.     Capillary Refill: Capillary refill takes less than 2 seconds.     Findings: Lesion (right lower anterior leg) and rash present.       Neurological:     General: No focal deficit present.      Mental Status: She is alert and oriented to person, place, and time.  Psychiatric:        Mood and Affect: Mood normal.        Behavior: Behavior normal.     There were no vitals taken for this visit. Wt Readings from Last 3 Encounters:  07/02/20 188 lb (85.3 kg)  06/25/20 192 lb (87.1 kg)  12/12/19 190 lb 12.8 oz (86.5 kg)     Health Maintenance Due  Topic Date Due  . Hepatitis C Screening  Never done  . COVID-19 Vaccine (1) Never done  . TETANUS/TDAP  Never done  . DEXA SCAN  Never done  . COLONOSCOPY (Pts 45-3yrs Insurance coverage will need to be confirmed)  08/22/2017      Lab Results  Component Value Date   TSH 3.370 06/25/2020   Lab Results  Component Value Date   WBC 7.5 06/25/2020   HGB 13.2 06/25/2020   HCT 38.9 06/25/2020   MCV 86 06/25/2020   PLT 404 06/25/2020   Lab Results  Component Value Date   NA 135 06/26/2020   K 4.5 06/26/2020   CO2 24 06/26/2020   GLUCOSE 113 (H) 06/26/2020   BUN 22 06/26/2020   CREATININE 0.96 06/26/2020   BILITOT <0.2 06/26/2020   ALKPHOS 117 06/26/2020   AST 25 06/26/2020   ALT 18 06/26/2020   PROT 7.5 06/26/2020   ALBUMIN 4.5 06/26/2020   CALCIUM 9.8 06/26/2020   ANIONGAP 8 03/11/2016   Lab Results  Component Value Date   CHOL 287 (H) 07/02/2020   Lab Results  Component Value Date   HDL 61 07/02/2020   Lab Results  Component Value Date   LDLCALC 194 (H) 07/02/2020   Lab Results  Component Value Date   TRIG 170 (H) 07/02/2020   Lab Results  Component Value Date   CHOLHDL 4.7 (H) 07/02/2020   Lab Results  Component Value Date   HGBA1C 6.2 (H) 06/25/2020      Assessment & Plan:   1. Mixed hyperlipidemia-not at goal, labs pending  - CBC with Differential/Platelet - Comprehensive metabolic panel - Lipid panel  2. Essential hypertension-well controlled - CBC with Differential/Platelet - Comprehensive metabolic panel - Lipid panel  3. Primary osteoarthritis, unspecified site-well  controlled -  Continue Celebrex 200 mg daily -Continue physical activity  4. BMI 26.0-26.9,adult -Continue heart healthy diet -Continue physical activity   Follow-up: 30-months    Rip Harbour, NP

## 2020-10-15 NOTE — Patient Instructions (Signed)
Continue medications Continue heart healthy diet Continue physical activity  Fall Prevention in the Home, Adult Falls can cause injuries and can happen to people of all ages. There are many things you can do to make your home safe and to help prevent falls. Ask for help when making these changes. What actions can I take to prevent falls? General Instructions  Use good lighting in all rooms. Replace any light bulbs that burn out.  Turn on the lights in dark areas. Use night-lights.  Keep items that you use often in easy-to-reach places. Lower the shelves around your home if needed.  Set up your furniture so you have a clear path. Avoid moving your furniture around.  Do not have throw rugs or other things on the floor that can make you trip.  Avoid walking on wet floors.  If any of your floors are uneven, fix them.  Add color or contrast paint or tape to clearly mark and help you see: ? Grab bars or handrails. ? First and last steps of staircases. ? Where the edge of each step is.  If you use a stepladder: ? Make sure that it is fully opened. Do not climb a closed stepladder. ? Make sure the sides of the stepladder are locked in place. ? Ask someone to hold the stepladder while you use it.  Know where your pets are when moving through your home. What can I do in the bathroom?  Keep the floor dry. Clean up any water on the floor right away.  Remove soap buildup in the tub or shower.  Use nonskid mats or decals on the floor of the tub or shower.  Attach bath mats securely with double-sided, nonslip rug tape.  If you need to sit down in the shower, use a plastic, nonslip stool.  Install grab bars by the toilet and in the tub and shower. Do not use towel bars as grab bars.      What can I do in the bedroom?  Make sure that you have a light by your bed that is easy to reach.  Do not use any sheets or blankets for your bed that hang to the floor.  Have a firm chair with  side arms that you can use for support when you get dressed. What can I do in the kitchen?  Clean up any spills right away.  If you need to reach something above you, use a step stool with a grab bar.  Keep electrical cords out of the way.  Do not use floor polish or wax that makes floors slippery. What can I do with my stairs?  Do not leave any items on the stairs.  Make sure that you have a light switch at the top and the bottom of the stairs.  Make sure that there are handrails on both sides of the stairs. Fix handrails that are broken or loose.  Install nonslip stair treads on all your stairs.  Avoid having throw rugs at the top or bottom of the stairs.  Choose a carpet that does not hide the edge of the steps on the stairs.  Check carpeting to make sure that it is firmly attached to the stairs. Fix carpet that is loose or worn. What can I do on the outside of my home?  Use bright outdoor lighting.  Fix the edges of walkways and driveways and fix any cracks.  Remove anything that might make you trip as you walk through a door,  such as a raised step or threshold.  Trim any bushes or trees on paths to your home.  Check to see if handrails are loose or broken and that both sides of all steps have handrails.  Install guardrails along the edges of any raised decks and porches.  Clear paths of anything that can make you trip, such as tools or rocks.  Have leaves, snow, or ice cleared regularly.  Use sand or salt on paths during winter.  Clean up any spills in your garage right away. This includes grease or oil spills. What other actions can I take?  Wear shoes that: ? Have a low heel. Do not wear high heels. ? Have rubber bottoms. ? Feel good on your feet and fit well. ? Are closed at the toe. Do not wear open-toe sandals.  Use tools that help you move around if needed. These include: ? Canes. ? Walkers. ? Scooters. ? Crutches.  Review your medicines with  your doctor. Some medicines can make you feel dizzy. This can increase your chance of falling. Ask your doctor what else you can do to help prevent falls. Where to find more information  Centers for Disease Control and Prevention, [place]: http://www.wolf.info/  National Institute on Aging: http://kim-miller.com/ Contact a doctor if:  You are afraid of falling at home.  You feel weak, drowsy, or dizzy at home.  You fall at home. Summary  There are many simple things that you can do to make your home safe and to help prevent falls.  Ways to make your home safe include removing things that can make you trip and installing grab bars in the bathroom.  Ask for help when making these changes in your home. This information is not intended to replace advice given to you by your health care provider. Make sure you discuss any questions you have with your health care provider. Document Revised: 03/07/2020 Document Reviewed: 03/07/2020 Elsevier Patient Education  [place] 65 Years and Older, Female Preventive care refers to lifestyle choices and visits with your health care provider that can promote health and wellness. This includes:  A yearly physical exam. This is also called an annual wellness visit.  Regular dental and eye exams.  Immunizations.  Screening for certain conditions.  Healthy lifestyle choices, such as: ? Eating a healthy diet. ? Getting regular exercise. ? Not using drugs or products that contain nicotine and tobacco. ? Limiting alcohol use. What can I expect for my preventive care visit? Physical exam Your health care provider will check your:  Height and weight. These may be used to calculate your BMI (body mass index). BMI is a measurement that tells if you are at a healthy weight.  Heart rate and blood pressure.  Body temperature.  Skin for abnormal spots. Counseling Your health care provider may ask you questions about your:  Past medical  problems.  Family's medical history.  Alcohol, tobacco, and drug use.  Emotional well-being.  Home life and relationship well-being.  Sexual activity.  Diet, exercise, and sleep habits.  History of falls.  Memory and ability to understand (cognition).  Work and work Statistician.  Pregnancy and menstrual history.  Access to firearms. What immunizations do I need? Vaccines are usually given at various ages, according to a schedule. Your health care provider will recommend vaccines for you based on your age, medical history, and lifestyle or other factors, such as travel or where you work.   What tests do I need?  Blood tests  Lipid and cholesterol levels. These may be checked every 5 years, or more often depending on your overall health.  Hepatitis C test.  Hepatitis B test. Screening  Lung cancer screening. You may have this screening every year starting at age 35 if you have a 30-pack-year history of smoking and currently smoke or have quit within the past 15 years.  Colorectal cancer screening. ? All adults should have this screening starting at age 1 and continuing until age 60. ? Your health care provider may recommend screening at age 70 if you are at increased risk. ? You will have tests every 1-10 years, depending on your results and the type of screening test.  Diabetes screening. ? This is done by checking your blood sugar (glucose) after you have not eaten for a while (fasting). ? You may have this done every 1-3 years.  Mammogram. ? This may be done every 1-2 years. ? Talk with your health care provider about how often you should have regular mammograms.  Abdominal aortic aneurysm (AAA) screening. You may need this if you are a current or former smoker.  BRCA-related cancer screening. This may be done if you have a family history of breast, ovarian, tubal, or peritoneal cancers. Other tests  STD (sexually transmitted disease) testing, if you are at  risk.  Bone density scan. This is done to screen for osteoporosis. You may have this done starting at age 76. Talk with your health care provider about your test results, treatment options, and if necessary, the need for more tests. Follow these instructions at home: Eating and drinking  Eat a diet that includes fresh fruits and vegetables, whole grains, lean protein, and low-fat dairy products. Limit your intake of foods with high amounts of sugar, saturated fats, and salt.  Take vitamin and mineral supplements as recommended by your health care provider.  Do not drink alcohol if your health care provider tells you not to drink.  If you drink alcohol: ? Limit how much you have to 0-1 drink a day. ? Be aware of how much alcohol is in your drink. In the [place] one drink equals one 12 oz bottle of beer (355 mL), one 5 oz glass of wine (148 mL), or one 1 oz glass of hard liquor (44 mL).   Lifestyle  Take daily care of your teeth and gums. Brush your teeth every morning and night with fluoride toothpaste. Floss one time each day.  Stay active. Exercise for at least 30 minutes 5 or more days each week.  Do not use any products that contain nicotine or tobacco, such as cigarettes, e-cigarettes, and chewing tobacco. If you need help quitting, ask your health care provider.  Do not use drugs.  If you are sexually active, practice safe sex. Use a condom or other form of protection in order to prevent STIs (sexually transmitted infections).  Talk with your health care provider about taking a low-dose aspirin or statin.  Find healthy ways to cope with stress, such as: ? Meditation, yoga, or listening to music. ? Journaling. ? Talking to a trusted person. ? Spending time with friends and family. Safety  Always wear your seat belt while driving or riding in a vehicle.  Do not drive: ? If you have been drinking alcohol. Do not ride with someone who has been drinking. ? When you are tired  or distracted. ? While texting.  Wear a helmet and other protective equipment during sports activities.  If  you have firearms in your house, make sure you follow all gun safety procedures. What's next?  Visit your health care provider once a year for an annual wellness visit.  Ask your health care provider how often you should have your eyes and teeth checked.  Stay up to date on all vaccines. This information is not intended to replace advice given to you by your health care provider. Make sure you discuss any questions you have with your health care provider. Document Revised: 07/25/2020 Document Reviewed: 07/29/2018 Elsevier Patient Education  2021 [place] Maintenance, Female Adopting a healthy lifestyle and getting preventive care are important in promoting health and wellness. Ask your health care provider about:  The right schedule for you to have regular tests and exams.  Things you can do on your own to prevent diseases and keep yourself healthy. What should I know about diet, weight, and exercise? Eat a healthy diet  Eat a diet that includes plenty of vegetables, fruits, low-fat dairy products, and lean protein.  Do not eat a lot of foods that are high in solid fats, added sugars, or sodium.   Maintain a healthy weight Body mass index (BMI) is used to identify weight problems. It estimates body fat based on height and weight. Your health care provider can help determine your BMI and help you achieve or maintain a healthy weight. Get regular exercise Get regular exercise. This is one of the most important things you can do for your health. Most adults should:  Exercise for at least 150 minutes each week. The exercise should increase your heart rate and make you sweat (moderate-intensity exercise).  Do strengthening exercises at least twice a week. This is in addition to the moderate-intensity exercise.  Spend less time sitting. Even light physical activity  can be beneficial. Watch cholesterol and blood lipids Have your blood tested for lipids and cholesterol at 72 years of age, then have this test every 5 years. Have your cholesterol levels checked more often if:  Your lipid or cholesterol levels are high.  You are older than 72 years of age.  You are at high risk for heart disease. What should I know about cancer screening? Depending on your health history and family history, you may need to have cancer screening at various ages. This may include screening for:  Breast cancer.  Cervical cancer.  Colorectal cancer.  Skin cancer.  Lung cancer. What should I know about heart disease, diabetes, and high blood pressure? Blood pressure and heart disease  High blood pressure causes heart disease and increases the risk of stroke. This is more likely to develop in people who have high blood pressure readings, are of African descent, or are overweight.  Have your blood pressure checked: ? Every 3-5 years if you are 26-23 years of age. ? Every year if you are 44 years old or older. Diabetes Have regular diabetes screenings. This checks your fasting blood sugar level. Have the screening done:  Once every three years after age 12 if you are at a normal weight and have a low risk for diabetes.  More often and at a younger age if you are overweight or have a high risk for diabetes. What should I know about preventing infection? Hepatitis B If you have a higher risk for hepatitis B, you should be screened for this virus. Talk with your health care provider to find out if you are at risk for hepatitis B infection. Hepatitis C Testing is recommended  for:  Everyone born from [birth year] through 1967.  Anyone with known risk factors for hepatitis C. Sexually transmitted infections (STIs)  Get screened for STIs, including gonorrhea and chlamydia, if: ? You are sexually active and are younger than 72 years of age. ? You are older than 72 years of  age and your health care provider tells you that you are at risk for this type of infection. ? Your sexual activity has changed since you were last screened, and you are at increased risk for chlamydia or gonorrhea. Ask your health care provider if you are at risk.  Ask your health care provider about whether you are at high risk for HIV. Your health care provider may recommend a prescription medicine to help prevent HIV infection. If you choose to take medicine to prevent HIV, you should first get tested for HIV. You should then be tested every 3 months for as long as you are taking the medicine. Pregnancy  If you are about to stop having your period (premenopausal) and you may become pregnant, seek counseling before you get pregnant.  Take 400 to 800 micrograms (mcg) of folic acid every day if you become pregnant.  Ask for birth control (contraception) if you want to prevent pregnancy. Osteoporosis and menopause Osteoporosis is a disease in which the bones lose minerals and strength with aging. This can result in bone fractures. If you are 59 years old or older, or if you are at risk for osteoporosis and fractures, ask your health care provider if you should:  Be screened for bone loss.  Take a calcium or vitamin D supplement to lower your risk of fractures.  Be given hormone replacement therapy (HRT) to treat symptoms of menopause. Follow these instructions at home: Lifestyle  Do not use any products that contain nicotine or tobacco, such as cigarettes, e-cigarettes, and chewing tobacco. If you need help quitting, ask your health care provider.  Do not use street drugs.  Do not share needles.  Ask your health care provider for help if you need support or information about quitting drugs. Alcohol use  Do not drink alcohol if: ? Your health care provider tells you not to drink. ? You are pregnant, may be pregnant, or are planning to become pregnant.  If you drink alcohol: ? Limit  how much you use to 0-1 drink a day. ? Limit intake if you are breastfeeding.  Be aware of how much alcohol is in your drink. In the [place] one drink equals one 12 oz bottle of beer (355 mL), one 5 oz glass of wine (148 mL), or one 1 oz glass of hard liquor (44 mL). General instructions  Schedule regular health, dental, and eye exams.  Stay current with your vaccines.  Tell your health care provider if: ? You often feel depressed. ? You have ever been abused or do not feel safe at home. Summary  Adopting a healthy lifestyle and getting preventive care are important in promoting health and wellness.  Follow your health care provider's instructions about healthy diet, exercising, and getting tested or screened for diseases.  Follow your health care provider's instructions on monitoring your cholesterol and blood pressure. This information is not intended to replace advice given to you by your health care provider. Make sure you discuss any questions you have with your health care provider. Document Revised: 07/28/2018 Document Reviewed: 07/28/2018 Elsevier Patient Education  2021 [place]. Preventing High Cholesterol Cholesterol is a white, waxy substance similar to fat  that the human body needs to help build cells. The liver makes all the cholesterol that a person's body needs. Having high cholesterol (hypercholesterolemia) increases your risk for heart disease and stroke. Extra or excess cholesterol comes from the food that you eat. High cholesterol can often be prevented with diet and lifestyle changes. If you already have high cholesterol, you can control it with diet, lifestyle changes, and medicines. How can high cholesterol affect me? If you have high cholesterol, fatty deposits (plaques) may build up on the walls of your blood vessels. The blood vessels that carry blood away from your heart are called arteries. Plaques make the arteries narrower and stiffer. This in turn  can:  Restrict or block blood flow and cause blood clots to form.  Increase your risk for heart attack and stroke. What can increase my risk for high cholesterol? This condition is more likely to develop in people who:  Eat foods that are high in saturated fat or cholesterol. Saturated fat is mostly found in foods that come from animal sources.  Are overweight.  Are not getting enough exercise.  Have a family history of high cholesterol (familial hypercholesterolemia). What actions can I take to prevent this? Nutrition  Eat less saturated fat.  Avoid trans fats (partially hydrogenated oils). These are often found in margarine and in some baked goods, fried foods, and snacks bought in packages.  Avoid precooked or cured meat, such as bacon, sausages, or meat loaves.  Avoid foods and drinks that have added sugars.  Eat more fruits, vegetables, and whole grains.  Choose healthy sources of protein, such as fish, poultry, lean cuts of red meat, beans, peas, lentils, and nuts.  Choose healthy sources of fat, such as: ? Nuts. ? Vegetable oils, especially olive oil. ? Fish that have healthy fats, such as omega-3 fatty acids. These fish include mackerel or salmon.   Lifestyle  Lose weight if you are overweight. Maintaining a healthy body mass index (BMI) can help prevent or control high cholesterol. It can also lower your risk for diabetes and high blood pressure. Ask your health care provider to help you with a diet and exercise plan to lose weight safely.  Do not use any products that contain nicotine or tobacco, such as cigarettes, e-cigarettes, and chewing tobacco. If you need help quitting, ask your health care provider. Alcohol use  Do not drink alcohol if: ? Your health care provider tells you not to drink. ? You are pregnant, may be pregnant, or are planning to become pregnant.  If you drink alcohol: ? Limit how much you use to:  0-1 drink a day for women.  0-2 drinks  a day for men. ? Be aware of how much alcohol is in your drink. In the [place] one drink equals one 12 oz bottle of beer (355 mL), one 5 oz glass of wine (148 mL), or one 1 oz glass of hard liquor (44 mL). Activity  Get enough exercise. Do exercises as told by your health care provider.  Each week, do at least 150 minutes of exercise that takes a medium level of effort (moderate-intensity exercise). This kind of exercise: ? Makes your heart beat faster while allowing you to still be able to talk. ? Can be done in short sessions several times a day or longer sessions a few times a week. For example, on 5 days each week, you could walk fast or ride your bike 3 times a day for 10 minutes each time.  Medicines  Your health care provider may recommend medicines to help lower cholesterol. This may be a medicine to lower the amount of cholesterol that your liver makes. You may need medicine if: ? Diet and lifestyle changes have not lowered your cholesterol enough. ? You have high cholesterol and other risk factors for heart disease or stroke.  Take over-the-counter and prescription medicines only as told by your health care provider. General information  Manage your risk factors for high cholesterol. Talk with your health care provider about all your risk factors and how to lower your risk.  Manage other conditions that you have, such as diabetes or high blood pressure (hypertension).  Have blood tests to check your cholesterol levels at regular points in time as told by your health care provider.  Keep all follow-up visits as told by your health care provider. This is important. Where to find more information  American Heart Association: www.heart.org  National Heart, Lung, and Blood Institute: https://wilson-eaton.com/ Summary  High cholesterol increases your risk for heart disease and stroke. By keeping your cholesterol level low, you can reduce your risk for these conditions.  High cholesterol  can often be prevented with diet and lifestyle changes.  Work with your health care provider to manage your risk factors, and have your blood tested regularly. This information is not intended to replace advice given to you by your health care provider. Make sure you discuss any questions you have with your health care provider. Document Revised: 05/17/2019 Document Reviewed: 05/17/2019 Elsevier Patient Education  2021 [place]. Cholesterol Content in Foods Cholesterol is a waxy, fat-like substance that helps to carry fat in the blood. The body needs cholesterol in small amounts, but too much cholesterol can cause damage to the arteries and heart. Most people should eat less than 200 milligrams (mg) of cholesterol a day. Foods with cholesterol Cholesterol is found in animal-based foods, such as meat, seafood, and dairy. Generally, low-fat dairy and lean meats have less cholesterol than full-fat dairy and fatty meats. The milligrams of cholesterol per serving (mg per serving) of common cholesterol-containing foods are listed below. Meat and other proteins  Egg -- one large whole egg has 186 mg.  Veal shank -- 4 oz has 141 mg.  Lean ground [place] (93% lean) -- 4 oz has 118 mg.  Fat-trimmed lamb loin -- 4 oz has 106 mg.  Lean ground beef (90% lean) -- 4 oz has 100 mg.  Lobster -- 3.5 oz has 90 mg.  Pork loin chops -- 4 oz has 86 mg.  Canned salmon -- 3.5 oz has 83 mg.  Fat-trimmed beef top loin -- 4 oz has 78 mg.  Frankfurter -- 1 frank (3.5 oz) has 77 mg.  Crab -- 3.5 oz has 71 mg.  Roasted chicken without skin, white meat -- 4 oz has 66 mg.  Light bologna -- 2 oz has 45 mg.  Deli-cut [place] -- 2 oz has 31 mg.  Canned tuna -- 3.5 oz has 31 mg.  [place] Salines -- 1 oz has 29 mg.  Oysters and mussels (raw) -- 3.5 oz has 25 mg.  Mackerel -- 1 oz has 22 mg.  Trout -- 1 oz has 20 mg.  Pork sausage -- 1 link (1 oz) has 17 mg.  Salmon -- 1 oz has 16 mg.  Tilapia -- 1 oz has  14 mg. Dairy  Soft-serve ice cream --  cup (4 oz) has 103 mg.  Whole-milk yogurt -- 1 cup (8 oz)  has 29 mg.  Cheddar cheese -- 1 oz has 28 mg.  American cheese -- 1 oz has 28 mg.  Whole milk -- 1 cup (8 oz) has 23 mg.  2% milk -- 1 cup (8 oz) has 18 mg.  Cream cheese -- 1 tablespoon (Tbsp) has 15 mg.  Cottage cheese --  cup (4 oz) has 14 mg.  Low-fat (1%) milk -- 1 cup (8 oz) has 10 mg.  Sour cream -- 1 Tbsp has 8.5 mg.  Low-fat yogurt -- 1 cup (8 oz) has 8 mg.  Nonfat Greek yogurt -- 1 cup (8 oz) has 7 mg.  Half-and-half cream -- 1 Tbsp has 5 mg. Fats and oils  Cod liver oil -- 1 tablespoon (Tbsp) has 82 mg.  Butter -- 1 Tbsp has 15 mg.  Lard -- 1 Tbsp has 14 mg.  Bacon grease -- 1 Tbsp has 14 mg.  Mayonnaise -- 1 Tbsp has 5-10 mg.  Margarine -- 1 Tbsp has 3-10 mg. Exact amounts of cholesterol in these foods may vary depending on specific ingredients and brands.   Foods without cholesterol Most plant-based foods do not have cholesterol unless you combine them with a food that has cholesterol. Foods without cholesterol include:  Grains and cereals.  Vegetables.  Fruits.  Vegetable oils, such as olive, canola, and sunflower oil.  Legumes, such as peas, beans, and lentils.  Nuts and seeds.  Egg whites.   Summary  The body needs cholesterol in small amounts, but too much cholesterol can cause damage to the arteries and heart.  Most people should eat less than 200 milligrams (mg) of cholesterol a day. This information is not intended to replace advice given to you by your health care provider. Make sure you discuss any questions you have with your health care provider. Document Revised: 12/26/2019 Document Reviewed: 12/26/2019 Elsevier Patient Education  [email or web address]">  DASH Eating Plan DASH stands for Dietary Approaches to Stop Hypertension. The DASH eating plan is a healthy  eating plan that has been shown to:  Reduce high blood pressure (hypertension).  Reduce your risk for type 2 diabetes, heart disease, and stroke.  Help with weight loss. What are tips for following this plan? Reading food labels  Check food labels for the amount of salt (sodium) per serving. Choose foods with less than 5 percent of the Daily Value of sodium. Generally, foods with less than 300 milligrams (mg) of sodium per serving fit into this eating plan.  To find whole grains, look for the word "whole" as the first word in the ingredient list. Shopping  Buy products labeled as "low-sodium" or "no salt added."  Buy fresh foods. Avoid canned foods and pre-made or frozen meals. Cooking  Avoid adding salt when cooking. Use salt-free seasonings or herbs instead of table salt or sea salt. Check with your health care provider or pharmacist before using salt substitutes.  Do not fry foods. Cook foods using healthy methods such as baking, boiling, grilling, roasting, and broiling instead.  Cook with heart-healthy oils, such as olive, canola, avocado, soybean, or sunflower oil. Meal planning  Eat a balanced diet that includes: ? 4 or more servings of fruits and 4 or more servings of vegetables each day. Try to fill one-half of your plate with fruits and vegetables. ? 6-8 servings of whole grains each day. ? Less than 6 oz (170 g) of lean meat, poultry, or fish each day. A 3-oz (85-g) serving of meat is about  the same size as a deck of cards. One egg equals 1 oz (28 g). ? 2-3 servings of low-fat dairy each day. One serving is 1 cup (237 mL). ? 1 serving of nuts, seeds, or beans 5 times each week. ? 2-3 servings of heart-healthy fats. Healthy fats called omega-3 fatty acids are found in foods such as walnuts, flaxseeds, fortified milks, and eggs. These fats are also found in cold-water fish, such as sardines, salmon, and mackerel.  Limit how much you eat of: ? Canned or prepackaged  foods. ? Food that is high in trans fat, such as some fried foods. ? Food that is high in saturated fat, such as fatty meat. ? Desserts and other sweets, sugary drinks, and other foods with added sugar. ? Full-fat dairy products.  Do not salt foods before eating.  Do not eat more than 4 egg yolks a week.  Try to eat at least 2 vegetarian meals a week.  Eat more home-cooked food and less restaurant, buffet, and fast food.   Lifestyle  When eating at a restaurant, ask that your food be prepared with less salt or no salt, if possible.  If you drink alcohol: ? Limit how much you use to:  0-1 drink a day for women who are not pregnant.  0-2 drinks a day for men. ? Be aware of how much alcohol is in your drink. In the [place] one drink equals one 12 oz bottle of beer (355 mL), one 5 oz glass of wine (148 mL), or one 1 oz glass of hard liquor (44 mL). General information  Avoid eating more than 2,300 mg of salt a day. If you have hypertension, you may need to reduce your sodium intake to 1,500 mg a day.  Work with your health care provider to maintain a healthy body weight or to lose weight. Ask what an ideal weight is for you.  Get at least 30 minutes of exercise that causes your heart to beat faster (aerobic exercise) most days of the week. Activities may include walking, swimming, or biking.  Work with your health care provider or dietitian to adjust your eating plan to your individual calorie needs. What foods should I eat? Fruits All fresh, dried, or frozen fruit. Canned fruit in natural juice (without added sugar). Vegetables Fresh or frozen vegetables (raw, steamed, roasted, or grilled). Low-sodium or reduced-sodium tomato and vegetable juice. Low-sodium or reduced-sodium tomato sauce and tomato paste. Low-sodium or reduced-sodium canned vegetables. Grains Whole-grain or whole-wheat bread. Whole-grain or whole-wheat pasta. Brown rice. [place]. Bulgur. Whole-grain and  low-sodium cereals. Pita bread. Low-fat, low-sodium crackers. Whole-wheat flour tortillas. Meats and other proteins Skinless chicken or [place]. Ground chicken or [place]. Pork with fat trimmed off. Fish and seafood. Egg whites. Dried beans, peas, or lentils. Unsalted nuts, nut butters, and seeds. Unsalted canned beans. Lean cuts of beef with fat trimmed off. Low-sodium, lean precooked or cured meat, such as sausages or meat loaves. Dairy Low-fat (1%) or fat-free (skim) milk. Reduced-fat, low-fat, or fat-free cheeses. Nonfat, low-sodium ricotta or cottage cheese. Low-fat or nonfat yogurt. Low-fat, low-sodium cheese. Fats and oils Soft margarine without trans fats. Vegetable oil. Reduced-fat, low-fat, or light mayonnaise and salad dressings (reduced-sodium). Canola, safflower, olive, avocado, soybean, and sunflower oils. Avocado. Seasonings and condiments Herbs. Spices. Seasoning mixes without salt. Other foods Unsalted popcorn and pretzels. Fat-free sweets. The items listed above may not be a complete list of foods and beverages you can eat. Contact a dietitian for more  information. What foods should I avoid? Fruits Canned fruit in a light or heavy syrup. Fried fruit. Fruit in cream or butter sauce. Vegetables Creamed or fried vegetables. Vegetables in a cheese sauce. Regular canned vegetables (not low-sodium or reduced-sodium). Regular canned tomato sauce and paste (not low-sodium or reduced-sodium). Regular tomato and vegetable juice (not low-sodium or reduced-sodium). Angie Fava. Olives. Grains Baked goods made with fat, such as croissants, muffins, or some breads. Dry pasta or rice meal packs. Meats and other proteins Fatty cuts of meat. Ribs. Fried meat. Berniece Salines. Bologna, salami, and other precooked or cured meats, such as sausages or meat loaves. Fat from the back of a pig (fatback). Bratwurst. Salted nuts and seeds. Canned beans with added salt. Canned or smoked fish. Whole eggs or egg yolks.  Chicken or [place] with skin. Dairy Whole or 2% milk, cream, and half-and-half. Whole or full-fat cream cheese. Whole-fat or sweetened yogurt. Full-fat cheese. Nondairy creamers. Whipped toppings. Processed cheese and cheese spreads. Fats and oils Butter. Stick margarine. Lard. Shortening. Ghee. Bacon fat. Tropical oils, such as coconut, palm kernel, or palm oil. Seasonings and condiments Onion salt, garlic salt, seasoned salt, table salt, and sea salt. Worcestershire sauce. Tartar sauce. Barbecue sauce. Teriyaki sauce. Soy sauce, including reduced-sodium. Steak sauce. Canned and packaged gravies. Fish sauce. Oyster sauce. Cocktail sauce. Store-bought horseradish. Ketchup. Mustard. Meat flavorings and tenderizers. Bouillon cubes. Hot sauces. Pre-made or packaged marinades. Pre-made or packaged taco seasonings. Relishes. Regular salad dressings. Other foods Salted popcorn and pretzels. The items listed above may not be a complete list of foods and beverages you should avoid. Contact a dietitian for more information. Where to find more information  National Heart, Lung, and Blood Institute: https://wilson-eaton.com/  American Heart Association: www.heart.org  Academy of Nutrition and Dietetics: www.eatright.[place]: www.kidney.org Summary  The DASH eating plan is a healthy eating plan that has been shown to reduce high blood pressure (hypertension). It may also reduce your risk for type 2 diabetes, heart disease, and stroke.  When on the DASH eating plan, aim to eat more fresh fruits and vegetables, whole grains, lean proteins, low-fat dairy, and heart-healthy fats.  With the DASH eating plan, you should limit salt (sodium) intake to 2,300 mg a day. If you have hypertension, you may need to reduce your sodium intake to 1,500 mg a day.  Work with your health care provider or dietitian to adjust your eating plan to your individual calorie needs. This information is not intended  to replace advice given to you by your health care provider. Make sure you discuss any questions you have with your health care provider. Document Revised: 07/08/2019 Document Reviewed: 07/08/2019 Elsevier Patient Education  2021 [employer].

## 2020-10-16 LAB — CBC WITH DIFFERENTIAL/PLATELET
Basophils Absolute: 0.1 10*3/uL (ref 0.0–0.2)
Basos: 1 %
EOS (ABSOLUTE): 0.3 10*3/uL (ref 0.0–0.4)
Eos: 4 %
Hematocrit: 42.2 % (ref 34.0–46.6)
Hemoglobin: 13.9 g/dL (ref 11.1–15.9)
Immature Grans (Abs): 0 10*3/uL (ref 0.0–0.1)
Immature Granulocytes: 0 %
Lymphocytes Absolute: 2 10*3/uL (ref 0.7–3.1)
Lymphs: 30 %
MCH: 28.8 pg (ref 26.6–33.0)
MCHC: 32.9 g/dL (ref 31.5–35.7)
MCV: 87 fL (ref 79–97)
Monocytes Absolute: 0.6 10*3/uL (ref 0.1–0.9)
Monocytes: 8 %
Neutrophils Absolute: 3.9 10*3/uL (ref 1.4–7.0)
Neutrophils: 57 %
Platelets: 378 10*3/uL (ref 150–450)
RBC: 4.83 x10E6/uL (ref 3.77–5.28)
RDW: 12.1 % (ref 11.7–15.4)
WBC: 6.8 10*3/uL (ref 3.4–10.8)

## 2020-10-16 LAB — COMPREHENSIVE METABOLIC PANEL
ALT: 19 IU/L (ref 0–32)
AST: 25 IU/L (ref 0–40)
Albumin/Globulin Ratio: 1.5 (ref 1.2–2.2)
Albumin: 4.5 g/dL (ref 3.7–4.7)
Alkaline Phosphatase: 100 IU/L (ref 44–121)
BUN/Creatinine Ratio: 22 (ref 12–28)
BUN: 25 mg/dL (ref 8–27)
Bilirubin Total: 0.4 mg/dL (ref 0.0–1.2)
CO2: 25 mmol/L (ref 20–29)
Calcium: 10.1 mg/dL (ref 8.7–10.3)
Chloride: 98 mmol/L (ref 96–106)
Creatinine, Ser: 1.13 mg/dL — ABNORMAL HIGH (ref 0.57–1.00)
Globulin, Total: 3 g/dL (ref 1.5–4.5)
Glucose: 100 mg/dL — ABNORMAL HIGH (ref 65–99)
Potassium: 5.4 mmol/L — ABNORMAL HIGH (ref 3.5–5.2)
Sodium: 140 mmol/L (ref 134–144)
Total Protein: 7.5 g/dL (ref 6.0–8.5)
eGFR: 52 mL/min/{1.73_m2} — ABNORMAL LOW (ref >59)

## 2020-10-16 LAB — LIPID PANEL
Chol/HDL Ratio: 4.4 ratio (ref 0.0–4.4)
Cholesterol, Total: 269 mg/dL — ABNORMAL HIGH (ref 100–199)
HDL: 61 mg/dL (ref >39)
LDL Chol Calc (NIH): 180 mg/dL — ABNORMAL HIGH (ref 0–99)
Triglycerides: 154 mg/dL — ABNORMAL HIGH (ref 0–149)
VLDL Cholesterol Cal: 28 mg/dL (ref 5–40)

## 2020-10-16 LAB — CARDIOVASCULAR RISK ASSESSMENT

## 2020-10-30 ENCOUNTER — Other Ambulatory Visit: Payer: Self-pay | Admitting: Nurse Practitioner

## 2020-10-30 DIAGNOSIS — M1991 Primary osteoarthritis, unspecified site: Secondary | ICD-10-CM

## 2020-10-30 DIAGNOSIS — E782 Mixed hyperlipidemia: Secondary | ICD-10-CM

## 2020-10-30 MED ORDER — EZETIMIBE 10 MG PO TABS: 10.0000 mg | ORAL_TABLET | Freq: Every day | ORAL | 3 refills | Status: DC

## 2020-10-30 MED ORDER — CELECOXIB 100 MG PO CAPS: 100.0000 mg | ORAL_CAPSULE | Freq: Every day | ORAL | 1 refills | Status: DC

## 2020-11-03 ENCOUNTER — Other Ambulatory Visit: Payer: Self-pay | Admitting: Nurse Practitioner

## 2021-01-17 ENCOUNTER — Other Ambulatory Visit: Payer: Self-pay

## 2021-01-17 MED ORDER — LORAZEPAM 0.5 MG PO TABS: 0.5000 mg | ORAL_TABLET | Freq: Three times a day (TID) | ORAL | 3 refills | Status: DC | PRN

## 2021-01-21 ENCOUNTER — Ambulatory Visit: Payer: Medicare Other | Admitting: Nurse Practitioner

## 2021-01-23 ENCOUNTER — Other Ambulatory Visit: Payer: Self-pay | Admitting: Family Medicine

## 2021-01-23 ENCOUNTER — Other Ambulatory Visit: Payer: Self-pay | Admitting: Legal Medicine

## 2021-01-23 DIAGNOSIS — I10 Essential (primary) hypertension: Secondary | ICD-10-CM

## 2021-01-23 NOTE — Telephone Encounter (Signed)
This is yours lp

## 2021-01-31 ENCOUNTER — Other Ambulatory Visit: Payer: Self-pay

## 2021-01-31 ENCOUNTER — Ambulatory Visit (INDEPENDENT_AMBULATORY_CARE_PROVIDER_SITE_OTHER): Payer: Medicare Other | Admitting: Nurse Practitioner

## 2021-01-31 ENCOUNTER — Encounter: Payer: Self-pay | Admitting: Nurse Practitioner

## 2021-01-31 VITALS — BP 150/90 | HR 84 | Temp 98.1°F | Resp 16 | Ht 70.0 in | Wt 190.0 lb

## 2021-01-31 DIAGNOSIS — S41111D Laceration without foreign body of right upper arm, subsequent encounter: Secondary | ICD-10-CM

## 2021-01-31 DIAGNOSIS — T148XXA Other injury of unspecified body region, initial encounter: Secondary | ICD-10-CM | POA: Diagnosis not present

## 2021-01-31 DIAGNOSIS — L089 Local infection of the skin and subcutaneous tissue, unspecified: Secondary | ICD-10-CM | POA: Diagnosis not present

## 2021-01-31 DIAGNOSIS — I10 Essential (primary) hypertension: Secondary | ICD-10-CM

## 2021-01-31 MED ORDER — AMOXICILLIN 875 MG PO TABS: 875.0000 mg | ORAL_TABLET | Freq: Two times a day (BID) | ORAL | 0 refills | Status: AC

## 2021-01-31 MED ORDER — AMOXICILLIN 875 MG PO TABS: 875.0000 mg | ORAL_TABLET | Freq: Two times a day (BID) | ORAL | 0 refills | Status: DC

## 2021-01-31 NOTE — Progress Notes (Signed)
Established Patient Office Visit  Subjective:  Patient ID: Susan Roberson, female    DOB: 11/08/1950  Age: 70 y.o. MRN: [id number]  CC:  Chief Complaint  Patient presents with   Follow-up    Patient went to ED at [facility] on 01/27/2021.    HPI [name] presents for right forearm laceration with suture closure. She tells me that a hanging basket of flowers fell across her right forearm causing a deep laceration. She was seen and treated at [facility] ED  Follow up ER visit  Patient was seen in ER for right forearm laceration on 01/27/21. She was treated for right forearm laceration. Treatment for this included suturing right forearm wound. She reports good compliance with treatment. She reports this condition is Worse. She has developed erythema, warmth, edema, and increased tenderness surrounding right forearm wound.  Past Medical History:  Diagnosis Date   Arthritis    Cancer ([place])    pre cancerous skin cancer with multiple lesions removed   Complication of anesthesia    broken teeth   Essential (primary) hypertension    Generalized anxiety disorder    Hyperkalemia    Hypertension     Past Surgical History:  Procedure Laterality Date   SKIN CANCER EXCISION Bilateral    legs   TOTAL HIP ARTHROPLASTY Right    5 yrs ago   TOTAL KNEE ARTHROPLASTY Right 03/10/2016   Procedure: RIGHT TOTAL KNEE ARTHROPLASTY;  Surgeon: Vickey Huger, MD;  Location: [place];  Service: Orthopedics;  Laterality: Right;    Family History  Problem Relation Age of Onset   Stroke Father    Cerebrovascular Accident Other    Diabetes Mellitus II Other    Hyperlipidemia Other    Hypertension Other     Social History   Socioeconomic History   Marital status: Married    Spouse name: Not on file   Number of children: Not on file   Years of education: Not on file   Highest education level: Not on file  Occupational History   Not on file  Tobacco Use   Smoking status: Never    Smokeless tobacco: Never  Vaping Use   Vaping Use: Never used  Substance and Sexual Activity   Alcohol use: Yes    Comment: Drinks on a social basis.   Drug use: No   Sexual activity: Not on file  Other Topics Concern   Not on file  Social History Narrative   Not on file   Social Determinants of Health   Financial Resource Strain: Low Risk    Difficulty of Paying Living Expenses: Not hard at all  Food Insecurity: No Food Insecurity   Worried About Running Out of Food in the Last Year: Never true   [place] in the Last Year: Never true  Transportation Needs: No Transportation Needs   Lack of Transportation (Medical): No   Lack of Transportation (Non-Medical): No  Physical Activity: Sufficiently Active   Days of Exercise per Week: 6 days   Minutes of Exercise per Session: 30 min  Stress: No Stress Concern Present   Feeling of Stress : Not at all  Social Connections: Socially Integrated   Frequency of Communication with Friends and Family: More than three times a week   Frequency of Social Gatherings with Friends and Family: More than three times a week   Attends Religious Services: More than 4 times per year   Active Member of [employer] or Organizations: Yes  Attends Music therapist: More than 4 times per year   Marital Status: Married  Human resources officer Violence: Not At Risk   Fear of Current or Ex-Partner: No   Emotionally Abused: No   Physically Abused: No   Sexually Abused: No    Outpatient Medications Prior to Visit  Medication Sig Dispense Refill   benazepril-hydrochlorthiazide (LOTENSIN HCT) 20-25 MG tablet Take 1 tablet by mouth daily.     celecoxib (CELEBREX) 100 MG capsule Take 1 capsule (100 mg total) by mouth daily. 90 capsule 1   ezetimibe (ZETIA) 10 MG tablet Take 1 tablet (10 mg total) by mouth daily. 90 tablet 3   LORazepam (ATIVAN) 0.5 MG tablet Take 1 tablet (0.5 mg total) by mouth 3 (three) times daily as needed for anxiety. 90  tablet 3   pramipexole (MIRAPEX) 0.25 MG tablet TAKE 1 TABLET BY MOUTH EVERY NIGHT AT BEDTIME 90 tablet 2   amLODipine (NORVASC) 10 MG tablet TAKE 1 TABLET BY MOUTH EVERY DAY 90 tablet 2   lovastatin (MEVACOR) 20 MG tablet TAKE 1 TABLET(20 MG) BY MOUTH AT BEDTIME 90 tablet 0   mupirocin ointment (BACTROBAN) 2 % Apply topically 2 (two) times daily.     No facility-administered medications prior to visit.    Allergies  Allergen Reactions   Prednisone     Caused bilateral cataracts immediately   No Known Allergies     ROS Review of Systems  Skin:  Positive for wound.  All other systems reviewed and are negative.    Objective:    Physical Exam Vitals reviewed.  Constitutional:      Appearance: Normal appearance.  Cardiovascular:     Pulses: Normal pulses.          Radial pulses are 2+ on the right side.  Musculoskeletal:     Right forearm: Edema, laceration and tenderness present.     Left forearm: Normal.  Skin:    General: Skin is warm and dry.     Capillary Refill: Capillary refill takes less than 2 seconds.     Findings: Erythema and laceration present.     Comments: 7 cm laceration closed with sutures, edges well approximated. Erythema, warmth, edema, and tenderness noted to surrounding tissue and right hand.  Neurological:     Mental Status: She is alert.    BP (!) 150/90   Pulse 84   Temp 98.1 F (36.7 C)   Resp 16   Ht 5' 10" (1.778 m)   Wt 190 lb (86.2 kg)   BMI 27.26 kg/m  Wt Readings from Last 3 Encounters:  01/31/21 190 lb (86.2 kg)  10/15/20 186 lb 9.6 oz (84.6 kg)  07/02/20 188 lb (85.3 kg)     Health Maintenance Due  Topic Date Due   Hepatitis C Screening  Never done   TETANUS/TDAP  Never done   Zoster Vaccines- Shingrix (1 of 2) Never done   DEXA SCAN  Never done   COLONOSCOPY (Pts 45-28yr Insurance coverage will need to be confirmed)  08/22/2017   COVID-19 Vaccine (2 - Moderna risk series) 08/17/2020    There are no preventive care  reminders to display for this patient.  Lab Results  Component Value Date   TSH 3.370 06/25/2020   Lab Results  Component Value Date   WBC 6.8 10/15/2020   HGB 13.9 10/15/2020   HCT 42.2 10/15/2020   MCV 87 10/15/2020   PLT 378 10/15/2020   Lab Results  Component Value Date  NA 140 10/15/2020   K 5.4 (H) 10/15/2020   CO2 25 10/15/2020   GLUCOSE 100 (H) 10/15/2020   BUN 25 10/15/2020   CREATININE 1.13 (H) 10/15/2020   BILITOT 0.4 10/15/2020   ALKPHOS 100 10/15/2020   AST 25 10/15/2020   ALT 19 10/15/2020   PROT 7.5 10/15/2020   ALBUMIN 4.5 10/15/2020   CALCIUM 10.1 10/15/2020   ANIONGAP 8 03/11/2016   EGFR 52 (L) 10/15/2020   Lab Results  Component Value Date   CHOL 269 (H) 10/15/2020   Lab Results  Component Value Date   HDL 61 10/15/2020   Lab Results  Component Value Date   LDLCALC 180 (H) 10/15/2020   Lab Results  Component Value Date   TRIG 154 (H) 10/15/2020   Lab Results  Component Value Date   CHOLHDL 4.4 10/15/2020   Lab Results  Component Value Date   HGBA1C 6.2 (H) 06/25/2020      Assessment & Plan:   1. Laceration of right upper extremity, subsequent encounter - amoxicillin (AMOXIL) 875 MG tablet; Take 1 tablet (875 mg total) by mouth 2 (two) times daily for 5 days.  Dispense: 10 tablet; Refill: 0  2. Infected wound - amoxicillin (AMOXIL) 875 MG tablet; Take 1 tablet (875 mg total) by mouth 2 (two) times daily for 5 days.  Dispense: 10 tablet; Refill: 0  3. Uncontrolled hypertension  -DASH diet -Take Lotensin daily  Take all of antibiotics as prescribed Return in 2-weeks for suture removal  Follow-up: 2-weeks  Signed, Rip Harbour, NP

## 2021-01-31 NOTE — Patient Instructions (Signed)
Laceration Care, Adult A laceration is a cut that may go through all layers of the skin. The cut may also go into the tissue that is right under the skin. Some cuts heal on their own. Others need to be closed with stitches (sutures), staples, skin adhesive strips, or skin glue. Taking care of your injury lowers your risk of infection, helps your injury to heal better, and mayprevent scarring. Supplies needed: Soap. Water. Hand sanitizer. Bandage (dressing). Antibiotic ointment. Clean towel. How to take care of your cut Wash your hands with soap and water before touching your wound or changing yourbandage. If soap and water are not available, use hand sanitizer. If your doctor used stitches or staples: Keep the wound clean and dry. If you were given a bandage, change it at least once a day as told by your doctor. You should also change it if it gets wet or dirty. Keep the wound completely dry for the first 24 hours, or as told by your doctor. After that, you may take a shower or a bath. Do not get the wound soaked in water until after the stitches or staples have been removed. Clean the wound once a day, or as told by your doctor: Wash the wound with soap and water. Rinse the wound with water to remove all soap. Pat the wound dry with a clean towel. Do not rub the wound. After you clean the wound, put a thin layer of antibiotic ointment on it as told by your doctor. This ointment: Helps to prevent infection. Keeps the bandage from sticking to the wound. Have your stitches or staples removed as told by your doctor. If your doctor used skin adhesive strips: Keep the wound clean and dry. If you were given a bandage, you should change it at least once a day as told by your doctor. You should also change it if it gets wet or dirty. Do not get the skin adhesive strips wet. You can take a shower or a bath, but keep the wound dry. If the wound gets wet, pat it dry with a clean towel. Do not rub the  wound. Skin adhesive strips fall off on their own. You can trim the strips as the wound heals. Do not remove any strips that are still stuck to the wound. They will fall off after a while. If your doctor used skin glue: Try to keep your wound dry, but you may briefly wet it in the shower or bath. Do not soak the wound in water, such as by swimming. After you take a shower or a bath, gently pat the wound dry with a clean towel. Do not rub the wound. Do not do any activities that will make you really sweaty until the skin glue has fallen off on its own. Do not apply liquid, cream, or ointment medicine to your wound while the skin glue is still on. If you were given a bandage, you should change it at least once a day or as told by your doctor. You should also change it if it gets dirty or wet. If a bandage is placed over the wound, do not let the tape touch the skin glue. Do not pick at the glue. The skin glue usually stays on for 5-10 days. Then, it falls off the skin. General instructions  Take over-the-counter and prescription medicines only as told by your doctor. If you were given antibiotic medicine or ointment, take or apply it as told by your doctor. Do  not stop using it even if your condition improves. Do not scratch or pick at the wound. Check your wound every day for signs of infection. Watch for: Redness, swelling, or pain. Fluid, blood, or pus. Raise (elevate) the injured area above the level of your heart while you are sitting or lying down. If directed, put ice on the affected area: Put ice in a plastic bag. Place a towel between your skin and the bag. Leave the ice on for 20 minutes, 2-3 times a day. Prevent scarring by covering your wound with sunscreen of at least 30 SPF whenever you are outside after your wound has healed. Keep all follow-up visits as told by your doctor. This is important.  Get help if: You got a tetanus shot and you have any of these problems at the  injection site: Swelling. Very bad pain. Redness. Bleeding. You have a fever. A wound that was closed breaks open. You notice a bad smell coming from your wound or your bandage. You notice something coming out of the wound, such as wood or glass. Medicine does not relieve your pain. You have more redness, swelling, or pain at the site of your wound. You have fluid, blood, or pus coming from your wound. You notice a change in the color of your skin near your wound. You need to change the bandage often because fluid, blood, or pus is coming from the wound. You start to have a new rash. You start to have numbness around the wound. Get help right away if: You have very bad swelling around the wound. Your pain suddenly gets worse and is very bad. You notice painful lumps near the wound or anywhere on your body. You have a red streak going away from your wound. The wound is on your hand or foot, and: You cannot move a finger or toe. Your fingers or toes look pale or bluish. Summary A laceration is a cut that may go through all layers of the skin. The cut may also go into the tissue right under the skin. Some cuts heal on their own. Others need to be closed with stitches, staples, skin adhesive strips, or skin glue. Follow your doctor's instructions for caring for your cut. Proper care of a cut lowers the risk of infection, helps the cut heal better, and prevents scarring. This information is not intended to replace advice given to you by your health care provider. Make sure you discuss any questions you have with your healthcare provider. Document Revised: 10/02/2017 Document Reviewed: 08/24/2017 Elsevier Patient Education  2022 [employer].

## 2021-01-31 NOTE — Progress Notes (Deleted)
Subjective:  Patient ID: Susan Roberson, female    DOB: 1950-09-13  Age: 70 y.o. MRN: [id number]  Chief Complaint  Patient presents with   Follow-up    Patient went to ED at [facility] on 01/27/2021.    HPI   Current Outpatient Medications on File Prior to Visit  Medication Sig Dispense Refill   benazepril-hydrochlorthiazide (LOTENSIN HCT) 20-25 MG tablet Take 1 tablet by mouth daily.     celecoxib (CELEBREX) 100 MG capsule Take 1 capsule (100 mg total) by mouth daily. 90 capsule 1   ezetimibe (ZETIA) 10 MG tablet Take 1 tablet (10 mg total) by mouth daily. 90 tablet 3   LORazepam (ATIVAN) 0.5 MG tablet Take 1 tablet (0.5 mg total) by mouth 3 (three) times daily as needed for anxiety. 90 tablet 3   pramipexole (MIRAPEX) 0.25 MG tablet TAKE 1 TABLET BY MOUTH EVERY NIGHT AT BEDTIME 90 tablet 2   No current facility-administered medications on file prior to visit.   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   Complication of anesthesia    broken teeth   Essential (primary) hypertension    Generalized anxiety disorder    Hyperkalemia    Hypertension    Past Surgical History:  Procedure Laterality Date   SKIN CANCER EXCISION Bilateral    legs   TOTAL HIP ARTHROPLASTY Right    5 yrs ago   TOTAL KNEE ARTHROPLASTY Right 03/10/2016   Procedure: RIGHT TOTAL KNEE ARTHROPLASTY;  Surgeon: Vickey Huger, MD;  Location: [facility];  Service: Orthopedics;  Laterality: Right;    Family History  Problem Relation Age of Onset   Stroke Father    Cerebrovascular Accident Other    Diabetes Mellitus II Other    Hyperlipidemia Other    Hypertension Other    Social History   Socioeconomic History   Marital status: Married    Spouse name: Not on file   Number of children: Not on file   Years of education: Not on file   Highest education level: Not on file  Occupational History   Not on file  Tobacco Use    Smoking status: Never   Smokeless tobacco: Never  Vaping Use   Vaping Use: Never used  Substance and Sexual Activity   Alcohol use: Yes    Comment: Drinks on a social basis.   Drug use: No   Sexual activity: Not on file  Other Topics Concern   Not on file  Social History Narrative   Not on file   Social Determinants of Health   Financial Resource Strain: Low Risk    Difficulty of Paying Living Expenses: Not hard at all  Food Insecurity: No Food Insecurity   Worried About Running Out of Food in the Last Year: Never true   [place] in the Last Year: Never true  Transportation Needs: No Transportation Needs   Lack of Transportation (Medical): No   Lack of Transportation (Non-Medical): No  Physical Activity: Sufficiently Active   Days of Exercise per Week: 6 days   Minutes of Exercise per Session: 30 min  Stress: No Stress Concern Present   Feeling of Stress : Not at all  Social Connections: Socially Integrated   Frequency of Communication with Friends and Family: More than three times a week   Frequency of Social Gatherings with Friends and Family: More than three times  a week   Attends Religious Services: More than 4 times per year   Active Member of Clubs or Organizations: Yes   Attends Archivist Meetings: More than 4 times per year   Marital Status: Married    Review of Systems  Constitutional:  Negative for chills, fatigue and fever.  HENT:  Negative for congestion, ear pain and sore throat.   Respiratory:  Negative for cough and shortness of breath.   Cardiovascular:  Negative for chest pain and palpitations.  Gastrointestinal:  Negative for abdominal pain, constipation, diarrhea, nausea and vomiting.  Endocrine: Negative for polydipsia, polyphagia and polyuria.  Genitourinary:  Negative for difficulty urinating and dysuria.  Musculoskeletal:  Negative for arthralgias, back pain and myalgias.  Skin:  Positive for color change and wound. Negative  for rash.  Neurological:  Negative for headaches.  Psychiatric/Behavioral:  Negative for dysphoric mood. The patient is not nervous/anxious.     Objective:  BP (!) 150/90   Pulse 84   Temp 98.1 F (36.7 C)   Resp 16   Ht 5\' 10"  (1.778 m)   Wt 190 lb (86.2 kg)   BMI 27.26 kg/m   BP/Weight 01/31/2021 10/15/2020 87/68/1157  Systolic BP 262 035 597  Diastolic BP 90 86 68  Wt. (Lbs) 190 186.6 188  BMI 27.26 26.77 26.98    Physical Exam  Diabetic Foot Exam - Simple   No data filed      Lab Results  Component Value Date   WBC 6.8 10/15/2020   HGB 13.9 10/15/2020   HCT 42.2 10/15/2020   PLT 378 10/15/2020   GLUCOSE 100 (H) 10/15/2020   CHOL 269 (H) 10/15/2020   TRIG 154 (H) 10/15/2020   HDL 61 10/15/2020   LDLCALC 180 (H) 10/15/2020   ALT 19 10/15/2020   AST 25 10/15/2020   NA 140 10/15/2020   K 5.4 (H) 10/15/2020   CL 98 10/15/2020   CREATININE 1.13 (H) 10/15/2020   BUN 25 10/15/2020   CO2 25 10/15/2020   TSH 3.370 06/25/2020   INR 0.91 02/28/2016   HGBA1C 6.2 (H) 06/25/2020      Assessment & Plan:   There are no diagnoses linked to this encounter.   No orders of the defined types were placed in this encounter.   No orders of the defined types were placed in this encounter.    Follow-up: No follow-ups on file.  An After Visit Summary was printed and given to the patient.  Rip Harbour, NP [place] [phone]

## 2021-02-14 ENCOUNTER — Encounter: Payer: Self-pay | Admitting: Nurse Practitioner

## 2021-02-14 ENCOUNTER — Other Ambulatory Visit: Payer: Self-pay

## 2021-02-14 ENCOUNTER — Ambulatory Visit (INDEPENDENT_AMBULATORY_CARE_PROVIDER_SITE_OTHER): Payer: Medicare Other | Admitting: Nurse Practitioner

## 2021-02-14 VITALS — BP 128/78 | HR 78 | Temp 95.9°F | Ht 70.0 in | Wt 190.2 lb

## 2021-02-14 DIAGNOSIS — S41111D Laceration without foreign body of right upper arm, subsequent encounter: Secondary | ICD-10-CM

## 2021-02-14 DIAGNOSIS — Z4802 Encounter for removal of sutures: Secondary | ICD-10-CM | POA: Diagnosis not present

## 2021-02-14 MED ORDER — DOXYCYCLINE HYCLATE 100 MG PO TABS: 100.0000 mg | ORAL_TABLET | Freq: Two times a day (BID) | ORAL | 0 refills | Status: AC

## 2021-02-14 NOTE — Progress Notes (Signed)
Established Patient Office Visit  Subjective:  Patient ID: Susan Roberson, female    DOB: 1950/08/25  Age: 70 y.o. MRN: [id number]  CC:  Chief Complaint  Patient presents with   Suture / Staple Removal    HPI Susan Roberson presents for right arm laceration suture removal. She sustained a right forearm laceration on 01/27/21 when a hook from a hanging basket fell on her arm. She was seen at [facility] ED. The laceration was closed by suturing. She was seen on 01/31/21 by PCP for infection of the laceration. She was prescribed a course of Amoxicillin.Pt states wound infection improved initially but after completing antibiotics, redness and tenderness returned. She tells me she has been washing laceration with peroxide daily.   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   Complication of anesthesia    broken teeth   Essential (primary) hypertension    Generalized anxiety disorder    Hyperkalemia    Hypertension     Past Surgical History:  Procedure Laterality Date   SKIN CANCER EXCISION Bilateral    legs   TOTAL HIP ARTHROPLASTY Right    5 yrs ago   TOTAL KNEE ARTHROPLASTY Right 03/10/2016   Procedure: RIGHT TOTAL KNEE ARTHROPLASTY;  Surgeon: Vickey Huger, MD;  Location: [place];  Service: Orthopedics;  Laterality: Right;    Family History  Problem Relation Age of Onset   Stroke Father    Cerebrovascular Accident Other    Diabetes Mellitus II Other    Hyperlipidemia Other    Hypertension Other     Social History   Socioeconomic History   Marital status: Married    Spouse name: Not on file   Number of children: Not on file   Years of education: Not on file   Highest education level: Not on file  Occupational History   Not on file  Tobacco Use   Smoking status: Never   Smokeless tobacco: Never  Vaping Use   Vaping Use: Never used  Substance and Sexual Activity   Alcohol use: Yes     Comment: Drinks on a social basis.   Drug use: No   Sexual activity: Not on file  Other Topics Concern   Not on file  Social History Narrative   Not on file   Social Determinants of Health   Financial Resource Strain: Low Risk    Difficulty of Paying Living Expenses: Not hard at all  Food Insecurity: No Food Insecurity   Worried About Running Out of Food in the Last Year: Never true   [place] in the Last Year: Never true  Transportation Needs: No Transportation Needs   Lack of Transportation (Medical): No   Lack of Transportation (Non-Medical): No  Physical Activity: Sufficiently Active   Days of Exercise per Week: 6 days   Minutes of Exercise per Session: 30 min  Stress: No Stress Concern Present   Feeling of Stress : Not at all  Social Connections: Socially Integrated   Frequency of Communication with Friends and Family: More than three times a week   Frequency of Social Gatherings with Friends and Family: More than three times a week   Attends Religious Services: More than 4 times per year   Active Member of [employer] or Organizations: Yes   Attends Archivist Meetings: More than 4 times per year   Marital Status: Married  Intimate Partner Violence: Not At Risk   Fear of Current or Ex-Partner: No   Emotionally Abused: No   Physically Abused: No   Sexually Abused: No    Outpatient Medications Prior to Visit  Medication Sig Dispense Refill   benazepril-hydrochlorthiazide (LOTENSIN HCT) 20-25 MG tablet Take 1 tablet by mouth daily.     celecoxib (CELEBREX) 100 MG capsule Take 1 capsule (100 mg total) by mouth daily. 90 capsule 1   ezetimibe (ZETIA) 10 MG tablet Take 1 tablet (10 mg total) by mouth daily. 90 tablet 3   LORazepam (ATIVAN) 0.5 MG tablet Take 1 tablet (0.5 mg total) by mouth 3 (three) times daily as needed for anxiety. 90 tablet 3   pramipexole (MIRAPEX) 0.25 MG tablet TAKE 1 TABLET BY MOUTH EVERY NIGHT AT BEDTIME 90 tablet 2   No  facility-administered medications prior to visit.    Allergies  Allergen Reactions   Prednisone     Caused bilateral cataracts immediately   No Known Allergies     ROS Review of Systems  Skin:        Tenderness and warmth to right forearm laceration  All other systems reviewed and are negative.    Objective:    Physical Exam Vitals reviewed.  Constitutional:      Appearance: Normal appearance.  Pulmonary:     Effort: Pulmonary effort is normal.  Skin:    General: Skin is warm and dry.     Capillary Refill: Capillary refill takes less than 2 seconds.     Findings: Erythema and laceration present.          Comments: Laceration to right forearm approximately 7 cm in length. Edges of wound well superior and inferior areas of laceration well approximated. Center of laceration edges not well approximated, no drainage or odor present. Slight erythema, warmth, and tenderness with palpation noted to surrounding tissue.  Neurological:     General: No focal deficit present.     Mental Status: She is alert and oriented to person, place, and time.  Psychiatric:        Mood and Affect: Mood normal.        Behavior: Behavior normal.    BP 128/78 (BP Location: Left Arm, Patient Position: Sitting, Cuff Size: Normal)   Pulse 78   Temp (!) 95.9 F (35.5 C) (Temporal)   Ht _0  (1.778 m)   Wt 190 lb 3.2 oz (86.3 kg)   SpO2 96%   BMI 27.29 kg/m  Wt Readings from Last 3 Encounters:  02/14/21 190 lb 3.2 oz (86.3 kg)  01/31/21 190 lb (86.2 kg)  10/15/20 186 lb 9.6 oz (84.6 kg)     Health Maintenance Due  Topic Date Due   Hepatitis C Screening  Never done   TETANUS/TDAP  Never done   Zoster Vaccines- Shingrix (1 of 2) Never done   DEXA SCAN  Never done   COLONOSCOPY (Pts 45-20yr Insurance coverage will need to be confirmed)  08/22/2017   COVID-19 Vaccine (2 - Moderna risk series) 08/17/2020    Lab Results  Component Value Date   TSH 3.370 06/25/2020   Lab Results   Component Value Date   WBC 6.8 10/15/2020   HGB 13.9 10/15/2020   HCT 42.2 10/15/2020   MCV 87 10/15/2020   PLT 378 10/15/2020   Lab Results  Component Value Date   NA 140 10/15/2020   K 5.4 (H) 10/15/2020   CO2 25 10/15/2020   GLUCOSE 100 (H)  10/15/2020   BUN 25 10/15/2020   CREATININE 1.13 (H) 10/15/2020   BILITOT 0.4 10/15/2020   ALKPHOS 100 10/15/2020   AST 25 10/15/2020   ALT 19 10/15/2020   PROT 7.5 10/15/2020   ALBUMIN 4.5 10/15/2020   CALCIUM 10.1 10/15/2020   ANIONGAP 8 03/11/2016   EGFR 52 (L) 10/15/2020   Lab Results  Component Value Date   CHOL 269 (H) 10/15/2020   Lab Results  Component Value Date   HDL 61 10/15/2020   Lab Results  Component Value Date   LDLCALC 180 (H) 10/15/2020   Lab Results  Component Value Date   TRIG 154 (H) 10/15/2020   Lab Results  Component Value Date   CHOLHDL 4.4 10/15/2020   Lab Results  Component Value Date   HGBA1C 6.2 (H) 06/25/2020      Assessment & Plan:   1. Laceration of right upper extremity, subsequent encounter - doxycycline (VIBRA-TABS) 100 MG tablet; Take 1 tablet (100 mg total) by mouth 2 (two) times daily for 5 days.  Dispense: 10 tablet; Refill: 0  2. Encounter for removal of sutures  -right arm laceration healing incision, sutures removed, 1/4" steri-strips applied. Pt instructed not to apply peroxide to laceration due to it impeding healing. Pt verbalized understanding.    Meds ordered this encounter  Medications   doxycycline (VIBRA-TABS) 100 MG tablet    Sig: Take 1 tablet (100 mg total) by mouth 2 (two) times daily for 5 days.    Dispense:  10 tablet    Refill:  0    Order Specific Question:   Supervising Provider    AnswerShelton Silvas    Follow-up: PRN  Signed, Rip Harbour, NP

## 2021-02-14 NOTE — Patient Instructions (Signed)
Laceration Care, Adult A laceration is a cut that may go through all layers of the skin. The cut may also go into the tissue that is right under the skin. Some cuts heal on their own. Others need to be closed with stitches (sutures), staples, skin adhesive strips, or skin glue. Taking care of your injury lowers your risk of infection, helps your injury to heal better, and mayprevent scarring. Supplies needed: Soap. Water. Hand sanitizer. Bandage (dressing). Antibiotic ointment. Clean towel. How to take care of your cut Wash your hands with soap and water before touching your wound or changing yourbandage. If soap and water are not available, use hand sanitizer. If your doctor used stitches or staples: Keep the wound clean and dry. If you were given a bandage, change it at least once a day as told by your doctor. You should also change it if it gets wet or dirty. Keep the wound completely dry for the first 24 hours, or as told by your doctor. After that, you may take a shower or a bath. Do not get the wound soaked in water until after the stitches or staples have been removed. Clean the wound once a day, or as told by your doctor: Wash the wound with soap and water. Rinse the wound with water to remove all soap. Pat the wound dry with a clean towel. Do not rub the wound. After you clean the wound, put a thin layer of antibiotic ointment on it as told by your doctor. This ointment: Helps to prevent infection. Keeps the bandage from sticking to the wound. Have your stitches or staples removed as told by your doctor. If your doctor used skin adhesive strips: Keep the wound clean and dry. If you were given a bandage, you should change it at least once a day as told by your doctor. You should also change it if it gets wet or dirty. Do not get the skin adhesive strips wet. You can take a shower or a bath, but keep the wound dry. If the wound gets wet, pat it dry with a clean towel. Do not rub the  wound. Skin adhesive strips fall off on their own. You can trim the strips as the wound heals. Do not remove any strips that are still stuck to the wound. They will fall off after a while. If your doctor used skin glue: Try to keep your wound dry, but you may briefly wet it in the shower or bath. Do not soak the wound in water, such as by swimming. After you take a shower or a bath, gently pat the wound dry with a clean towel. Do not rub the wound. Do not do any activities that will make you really sweaty until the skin glue has fallen off on its own. Do not apply liquid, cream, or ointment medicine to your wound while the skin glue is still on. If you were given a bandage, you should change it at least once a day or as told by your doctor. You should also change it if it gets dirty or wet. If a bandage is placed over the wound, do not let the tape touch the skin glue. Do not pick at the glue. The skin glue usually stays on for 5-10 days. Then, it falls off the skin. General instructions  Take over-the-counter and prescription medicines only as told by your doctor. If you were given antibiotic medicine or ointment, take or apply it as told by your doctor. Do  not stop using it even if your condition improves. Do not scratch or pick at the wound. Check your wound every day for signs of infection. Watch for: Redness, swelling, or pain. Fluid, blood, or pus. Raise (elevate) the injured area above the level of your heart while you are sitting or lying down. If directed, put ice on the affected area: Put ice in a plastic bag. Place a towel between your skin and the bag. Leave the ice on for 20 minutes, 2-3 times a day. Prevent scarring by covering your wound with sunscreen of at least 30 SPF whenever you are outside after your wound has healed. Keep all follow-up visits as told by your doctor. This is important.  Get help if: You got a tetanus shot and you have any of these problems at the  injection site: Swelling. Very bad pain. Redness. Bleeding. You have a fever. A wound that was closed breaks open. You notice a bad smell coming from your wound or your bandage. You notice something coming out of the wound, such as wood or glass. Medicine does not relieve your pain. You have more redness, swelling, or pain at the site of your wound. You have fluid, blood, or pus coming from your wound. You notice a change in the color of your skin near your wound. You need to change the bandage often because fluid, blood, or pus is coming from the wound. You start to have a new rash. You start to have numbness around the wound. Get help right away if: You have very bad swelling around the wound. Your pain suddenly gets worse and is very bad. You notice painful lumps near the wound or anywhere on your body. You have a red streak going away from your wound. The wound is on your hand or foot, and: You cannot move a finger or toe. Your fingers or toes look pale or bluish. Summary A laceration is a cut that may go through all layers of the skin. The cut may also go into the tissue right under the skin. Some cuts heal on their own. Others need to be closed with stitches, staples, skin adhesive strips, or skin glue. Follow your doctor's instructions for caring for your cut. Proper care of a cut lowers the risk of infection, helps the cut heal better, and prevents scarring. This information is not intended to replace advice given to you by your health care provider. Make sure you discuss any questions you have with your healthcare provider. Document Revised: 10/02/2017 Document Reviewed: 08/24/2017 Elsevier Patient Education  2022 [employer].

## 2021-05-08 ENCOUNTER — Other Ambulatory Visit: Payer: Self-pay

## 2021-05-08 MED ORDER — BENAZEPRIL HCL 20 MG PO TABS: 20.0000 mg | ORAL_TABLET | Freq: Every day | ORAL | 0 refills | Status: DC

## 2021-05-22 ENCOUNTER — Other Ambulatory Visit: Payer: Self-pay

## 2021-05-22 ENCOUNTER — Other Ambulatory Visit: Payer: Self-pay | Admitting: Nurse Practitioner

## 2021-05-22 MED ORDER — HYDROCHLOROTHIAZIDE 25 MG PO TABS: 25.0000 mg | ORAL_TABLET | Freq: Every day | ORAL | 0 refills | Status: DC

## 2021-07-26 ENCOUNTER — Other Ambulatory Visit: Payer: Self-pay | Admitting: Nurse Practitioner

## 2021-08-08 ENCOUNTER — Other Ambulatory Visit: Payer: Self-pay | Admitting: Nurse Practitioner

## 2021-09-11 ENCOUNTER — Other Ambulatory Visit: Payer: Self-pay | Admitting: Nurse Practitioner

## 2021-09-11 DIAGNOSIS — M1991 Primary osteoarthritis, unspecified site: Secondary | ICD-10-CM

## 2021-09-12 ENCOUNTER — Other Ambulatory Visit: Payer: Self-pay | Admitting: Nurse Practitioner

## 2021-09-12 ENCOUNTER — Ambulatory Visit (INDEPENDENT_AMBULATORY_CARE_PROVIDER_SITE_OTHER): Payer: Medicare Other | Admitting: Nurse Practitioner

## 2021-09-12 ENCOUNTER — Other Ambulatory Visit: Payer: Self-pay

## 2021-09-12 ENCOUNTER — Encounter: Payer: Self-pay | Admitting: Nurse Practitioner

## 2021-09-12 VITALS — BP 134/76 | HR 73 | Temp 96.3°F | Ht 71.0 in | Wt 189.0 lb

## 2021-09-12 DIAGNOSIS — Z13820 Encounter for screening for osteoporosis: Secondary | ICD-10-CM

## 2021-09-12 DIAGNOSIS — I10 Essential (primary) hypertension: Secondary | ICD-10-CM | POA: Diagnosis not present

## 2021-09-12 DIAGNOSIS — E782 Mixed hyperlipidemia: Secondary | ICD-10-CM

## 2021-09-12 DIAGNOSIS — Z1231 Encounter for screening mammogram for malignant neoplasm of breast: Secondary | ICD-10-CM | POA: Diagnosis not present

## 2021-09-12 DIAGNOSIS — Z6826 Body mass index (BMI) 26.0-26.9, adult: Secondary | ICD-10-CM

## 2021-09-12 DIAGNOSIS — R252 Cramp and spasm: Secondary | ICD-10-CM | POA: Diagnosis not present

## 2021-09-12 DIAGNOSIS — M1991 Primary osteoarthritis, unspecified site: Secondary | ICD-10-CM

## 2021-09-12 DIAGNOSIS — E538 Deficiency of other specified B group vitamins: Secondary | ICD-10-CM

## 2021-09-12 DIAGNOSIS — E663 Overweight: Secondary | ICD-10-CM

## 2021-09-12 DIAGNOSIS — Z780 Asymptomatic menopausal state: Secondary | ICD-10-CM

## 2021-09-12 MED ORDER — CELECOXIB 100 MG PO CAPS: 100.0000 mg | ORAL_CAPSULE | Freq: Every day | ORAL | 1 refills | Status: DC

## 2021-09-12 MED ORDER — SEMAGLUTIDE-WEIGHT MANAGEMENT 1.7 MG/0.75ML ~~LOC~~ SOAJ
1.7000 mg | SUBCUTANEOUS | 0 refills | Status: AC
Start: 2021-12-08 — End: 2022-01-05

## 2021-09-12 MED ORDER — SEMAGLUTIDE-WEIGHT MANAGEMENT 0.5 MG/0.5ML ~~LOC~~ SOAJ: 0.5000 mg | SUBCUTANEOUS | 0 refills | Status: AC

## 2021-09-12 MED ORDER — SEMAGLUTIDE-WEIGHT MANAGEMENT 2.4 MG/0.75ML ~~LOC~~ SOAJ: 2.4000 mg | SUBCUTANEOUS | 0 refills | Status: AC

## 2021-09-12 MED ORDER — SEMAGLUTIDE-WEIGHT MANAGEMENT 0.25 MG/0.5ML ~~LOC~~ SOAJ: 0.2500 mg | SUBCUTANEOUS | 1 refills | Status: AC

## 2021-09-12 MED ORDER — CYANOCOBALAMIN 1000 MCG/ML IJ SOLN: 1000.0000 ug | INTRAMUSCULAR | 1 refills | Status: DC

## 2021-09-12 MED ORDER — SEMAGLUTIDE-WEIGHT MANAGEMENT 1 MG/0.5ML ~~LOC~~ SOAJ: 1.0000 mg | SUBCUTANEOUS | 0 refills | Status: AC

## 2021-09-12 NOTE — Patient Instructions (Addendum)
Continue medications Return for B12 injection weekly for four weeks then monthly Semaglutide [facility]) sent for weight loss, we will send prior authorization We will call you with labs and appt for mammogram and DEXA scan Follow-up in 34-months  Semaglutide Injection (Weight Management) What is this medication? SEMAGLUTIDE (SEM a GLOO tide) promotes weight loss. It may also be used to maintain weight loss. It works by decreasing appetite. Changes to diet and exercise are often combined with this medication. This medicine may be used for other purposes; ask your health care provider or pharmacist if you have questions. COMMON BRAND NAME(S): VELFYB What should I tell my care team before I take this medication? They need to know if you have any of these conditions: Endocrine tumors (MEN 2) or if someone in your family had these tumors Eye disease, vision problems Gallbladder disease History of depression or mental health disease History of pancreatitis Kidney disease Stomach or intestine problems Suicidal thoughts, plans, or attempt; a previous suicide attempt by you or a family member Thyroid cancer or if someone in your family had thyroid cancer An unusual or allergic reaction to semaglutide, other medications, foods, dyes, or preservatives Pregnant or trying to get pregnant Breast-feeding How should I use this medication? This medication is injected under the skin. You will be taught how to prepare and give it. Take it as directed on the prescription label. It is given once every week (every 7 days). Keep taking it unless your care team tells you to stop. It is important that you put your used needles and pens in a special sharps container. Do not put them in a trash can. If you do not have a sharps container, call your pharmacist or care team to get one. A special MedGuide will be given to you by the pharmacist with each prescription and refill. Be sure to read this information carefully  each time. This medication comes with INSTRUCTIONS FOR USE. Ask your pharmacist for directions on how to use this medication. Read the information carefully. Talk to your pharmacist or care team if you have questions. Talk to your care team about the use of this medication in children. Special care may be needed. Overdosage: If you think you have taken too much of this medicine contact a poison control center or emergency room at once. NOTE: This medicine is only for you. Do not share this medicine with others. What if I miss a dose? If you miss a dose and the next scheduled dose is more than 2 days away, take the missed dose as soon as possible. If you miss a dose and the next scheduled dose is less than 2 days away, do not take the missed dose. Take the next dose at your regular time. Do not take double or extra doses. If you miss your dose for 2 weeks or more, take the next dose at your regular time or call your care team to talk about how to restart this medication. What may interact with this medication? Insulin and other medications for diabetes This list may not describe all possible interactions. Give your health care provider a list of all the medicines, herbs, non-prescription drugs, or dietary supplements you use. Also tell them if you smoke, drink alcohol, or use illegal drugs. Some items may interact with your medicine. What should I watch for while using this medication? Visit your care team for regular checks on your progress. It may be some time before you see the benefit from this  medication. Drink plenty of fluids while taking this medication. Check with your care team if you have severe diarrhea, nausea, and vomiting, or if you sweat a lot. The loss of too much body fluid may make it dangerous for you to take this medication. This medication may affect blood sugar levels. Ask your care team if changes in diet or medications are needed if you have diabetes. If you or your family  notice any changes in your behavior, such as new or worsening depression, thoughts of harming yourself, anxiety, other unusual or disturbing thoughts, or memory loss, call your care team right away. Women should inform their care team if they wish to become pregnant or think they might be pregnant. Losing weight while pregnant is not advised and may cause harm to the unborn child. Talk to your care team for more information. What side effects may I notice from receiving this medication? Side effects that you should report to your care team as soon as possible: Allergic reactions--skin rash, itching, hives, swelling of the face, lips, tongue, or throat Change in vision Dehydration--increased thirst, dry mouth, feeling faint or lightheaded, headache, dark yellow or brown urine Gallbladder problems--severe stomach pain, nausea, vomiting, fever Heart palpitations--rapid, pounding, or irregular heartbeat Kidney injury--decrease in the amount of urine, swelling of the ankles, hands, or feet Pancreatitis--severe stomach pain that spreads to your back or gets worse after eating or when touched, fever, nausea, vomiting Thoughts of suicide or self-harm, worsening mood, feelings of depression Thyroid cancer--new mass or lump in the neck, pain or trouble swallowing, trouble breathing, hoarseness Side effects that usually do not require medical attention (report to your care team if they continue or are bothersome): Diarrhea Loss of appetite Nausea Stomach pain Vomiting This list may not describe all possible side effects. Call your doctor for medical advice about side effects. You may report side effects to FDA at 1-800-FDA-1088. Where should I keep my medication? Keep out of the reach of children and pets. Refrigeration (preferred): Store in the refrigerator. Do not freeze. Keep this medication in the original container until you are ready to take it. Get rid of any unused medication after the expiration  date. Room temperature: If needed, prior to cap removal, the pen can be stored at room temperature for up to 28 days. Protect from light. If it is stored at room temperature, get rid of any unused medication after 28 days or after it expires, whichever is first. It is important to get rid of the medication as soon as you no longer need it or it is expired. You can do this in two ways: Take the medication to a medication take-back program. Check with your pharmacy or law enforcement to find a location. If you cannot return the medication, follow the directions in the [place]. NOTE: This sheet is a summary. It may not cover all possible information. If you have questions about this medicine, talk to your doctor, pharmacist, or health care provider.  2022 Elsevier/Gold Standard (2020-11-09 00:00:00)   Leg Cramps Leg cramps occur when one or more muscles tighten and a person has no control over it (involuntary muscle contraction). Muscle cramps are most common in the calf muscles of the leg. They can occur during exercise or at rest. Leg cramps are painful, and they may last for a few seconds to a few minutes. Cramps may return several times before they finally stop. Usually, leg cramps are not caused by a serious medical problem. In many cases, the  cause is not known. Some common causes include: Excessive physical effort (overexertion), such as during intense exercise. Doing the same motion over and over. Staying in a certain position for a long period of time. Improper preparation, form, or technique while doing a sport or an activity. Dehydration. Injury. Side effects of certain medicines. Abnormally low levels of minerals in your blood (electrolytes), especially potassium and calcium. This could result from: Pregnancy. Taking diuretic medicines. Follow these instructions at home: Eating and drinking Drink enough fluid to keep your urine pale yellow. Staying hydrated may help prevent  cramps. Eat a healthy diet that includes plenty of nutrients to help your muscles function. A healthy diet includes fruits and vegetables, lean protein, whole grains, and low-fat or nonfat dairy products. Managing pain, stiffness, and swelling   Try massaging, stretching, and relaxing the affected muscle. Do this for several minutes at a time. If directed, put ice on areas that are sore or painful after a cramp. To do this: Put ice in a plastic bag. Place a towel between your skin and the bag. Leave the ice on for 20 minutes, 2-3 times a day. Remove the ice if your skin turns bright red. This is very important. If you cannot feel pain, heat, or cold, you have a greater risk of damage to the area. If directed, apply heat to muscles that are tense or tight. Do this before you exercise, or as often as told by your health care provider. Use the heat source that your health care provider recommends, such as a moist heat pack or a heating pad. To do this: Place a towel between your skin and the heat source. Leave the heat on for 20-30 minutes. Remove the heat if your skin turns bright red. This is especially important if you are unable to feel pain, heat, or cold. You may have a greater risk of getting burned. Try taking hot showers or baths to help relax tight muscles. General instructions If you are having frequent leg cramps, avoid intense exercise for several days. Take over-the-counter and prescription medicines only as told by your health care provider. Keep all follow-up visits. This is important. Contact a health care provider if: Your leg cramps get more severe or more frequent, or they do not improve over time. Your foot becomes cold, numb, or blue. Summary Muscle cramps can develop in any muscle, but the most common place is in the calf muscles of the leg. Leg cramps are painful, and they may last for a few seconds to a few minutes. Usually, leg cramps are not caused by a serious  medical problem. Often, the cause is not known. Stay hydrated, and take over-the-counter and prescription medicines only as told by your health care provider. This information is not intended to replace advice given to you by your health care provider. Make sure you discuss any questions you have with your health care provider. Document Revised: 12/21/2019 Document Reviewed: 12/21/2019 Elsevier Patient Education  [place].

## 2021-09-12 NOTE — Progress Notes (Signed)
Subjective:  Patient ID: Susan Roberson, female    DOB: 1950/12/13  Age: 71 y.o. MRN: [id number]  Chief Complaint  Patient presents with   Hyperlipidemia   Hypertension    HPI  Susan Roberson is a 71 year old Caucasian female that presents for follow-up of hypertension and hyperlipidemia. She is due for screening colonoscopy, mammogram, and bone density scan. She has declined colonoscopy screening at this time. She has had a recent eye exam. Susan Roberson reports chronic bilateral leg cramps during car rides and while sleeping. States she has to stand and "walk-off" cramps when they occur. States she had right hip and knee ORIF approximately 15 years ago after an accident. Denies previous back injury or back pain. She had skin cancer laser removal Sept 2022 to right shin. She is followed by Dr Jimmye Norman, dermatology. The wound to right lower leg has not healed completely.   Hypertension, follow-up:  She was last seen for hypertension 3 months ago.  BP at that visit was 134/76. Management since that visit includes benazepril 20 mg and HCTZ 25 mg.  She reports excellent compliance with treatment. She is not having side effects.  She is following a Regular diet. She is not exercising. She does not smoke.  Use of agents associated with hypertension: none.   Symptoms: No chest pain No chest pressure  No palpitations No syncope  No dyspnea No orthopnea  No paroxysmal nocturnal dyspnea No lower extremity edema   Pertinent labs: Lab Results  Component Value Date   CHOL 269 (H) 10/15/2020   HDL 61 10/15/2020   LDLCALC 180 (H) 10/15/2020   TRIG 154 (H) 10/15/2020   CHOLHDL 4.4 10/15/2020   Lab Results  Component Value Date   NA 140 10/15/2020   K 5.4 (H) 10/15/2020   CREATININE 1.13 (H) 10/15/2020   EGFR 52 (L) 10/15/2020   GFRNONAA 60 06/26/2020   GLUCOSE 100 (H) 10/15/2020     The 10-year ASCVD risk score (Arnett DK, et al., 2019) is: 17.7%    Lipid/Cholesterol, Follow-up  Last lipid panel  Other pertinent labs  Lab Results  Component Value Date   CHOL 269 (H) 10/15/2020   HDL 61 10/15/2020   LDLCALC 180 (H) 10/15/2020   TRIG 154 (H) 10/15/2020   CHOLHDL 4.4 10/15/2020   Lab Results  Component Value Date   ALT 19 10/15/2020   AST 25 10/15/2020   PLT 378 10/15/2020   TSH 3.370 06/25/2020     She was last seen for this 3 months ago.  Management since that visit includes Zetia.  She reports excellent compliance with treatment. She is not having side effects.      Current Outpatient Medications on File Prior to Visit  Medication Sig Dispense Refill   benazepril (LOTENSIN) 20 MG tablet Take 1 tablet (20 mg total) by mouth daily. 90 tablet 0   celecoxib (CELEBREX) 100 MG capsule Take 1 capsule (100 mg total) by mouth daily. 90 capsule 1   ezetimibe (ZETIA) 10 MG tablet Take 1 tablet (10 mg total) by mouth daily. 90 tablet 3   hydrochlorothiazide (HYDRODIURIL) 25 MG tablet Take 1 tablet (25 mg total) by mouth daily. 90 tablet 0   pramipexole (MIRAPEX) 0.25 MG tablet TAKE 1 TABLET BY MOUTH EVERY NIGHT AT BEDTIME 90 tablet 2   No current facility-administered medications on file prior to visit.   Past Medical History:  Diagnosis Date   Arthritis    Cancer ([place])    pre cancerous skin cancer with  multiple lesions removed   Complication of anesthesia    broken teeth   Essential (primary) hypertension    Generalized anxiety disorder    Hyperkalemia    Hypertension    Past Surgical History:  Procedure Laterality Date   SKIN CANCER EXCISION Bilateral    legs   TOTAL HIP ARTHROPLASTY Right    5 yrs ago   TOTAL KNEE ARTHROPLASTY Right 03/10/2016   Procedure: RIGHT TOTAL KNEE ARTHROPLASTY;  Surgeon: Vickey Huger, MD;  Location: [facility];  Service: Orthopedics;  Laterality: Right;    Family History  Problem Relation Age of Onset   Stroke Father    Cerebrovascular Accident Other    Diabetes Mellitus II Other    Hyperlipidemia Other    Hypertension Other    Social  History   Socioeconomic History   Marital status: Married    Spouse name: Not on file   Number of children: Not on file   Years of education: Not on file   Highest education level: Not on file  Occupational History   Not on file  Tobacco Use   Smoking status: Never   Smokeless tobacco: Never  Vaping Use   Vaping Use: Never used  Substance and Sexual Activity   Alcohol use: Yes    Comment: Drinks on a social basis.   Drug use: No   Sexual activity: Not on file  Other Topics Concern   Not on file  Social History Narrative   Not on file   Social Determinants of Health   Financial Resource Strain: Not on file  Food Insecurity: Not on file  Transportation Needs: Not on file  Physical Activity: Not on file  Stress: Not on file  Social Connections: Not on file    Review of Systems  Constitutional:  Negative for appetite change, fatigue and unexpected weight change.  HENT:  Negative for congestion, ear pain, rhinorrhea, sinus pressure, sinus pain and tinnitus.   Eyes:  Negative for pain.  Respiratory:  Negative for cough and shortness of breath.   Cardiovascular:  Negative for chest pain, palpitations and leg swelling.  Gastrointestinal:  Negative for abdominal pain, constipation, diarrhea, nausea and vomiting.  Endocrine: Negative for cold intolerance, heat intolerance, polydipsia, polyphagia and polyuria.  Genitourinary:  Negative for dysuria, frequency and hematuria.  Musculoskeletal:  Negative for arthralgias, back pain, joint swelling and myalgias.       Bilateral leg cramps intermittently  Skin:  Positive for wound (right lower leg). Negative for rash.  Allergic/Immunologic: Negative for environmental allergies.  Neurological:  Negative for dizziness and headaches.  Hematological:  Negative for adenopathy.  Psychiatric/Behavioral:  Negative for decreased concentration and sleep disturbance. The patient is not nervous/anxious.     Objective:  Pulse 73    Temp (!)  96.3 F (35.7 C)    Ht _0  (1.803 m)    Wt 189 lb (85.7 kg)    SpO2 97%    BMI 26.36 kg/m  BP 134/76    Pulse 73    Temp (!) 96.3 F (35.7 C)    Ht _1  (1.803 m)    Wt 189 lb (85.7 kg)    SpO2 97%    BMI 26.36 kg/m    BP/Weight 09/12/2021 02/14/2021 12/10/9561  Systolic BP - 875 643  Diastolic BP - 78 90  Wt. (Lbs) 189 190.2 190  BMI 26.36 27.29 27.26    Physical Exam Vitals reviewed.  Constitutional:      Appearance: Normal appearance.  HENT:     Head: Normocephalic.     Right Ear: Tympanic membrane normal.     Left Ear: Tympanic membrane normal.     Nose: Nose normal.     Mouth/Throat:     Mouth: Mucous membranes are moist.  Eyes:     Pupils: Pupils are equal, round, and reactive to light.  Cardiovascular:     Rate and Rhythm: Normal rate and regular rhythm.     Pulses: Normal pulses.     Heart sounds: Normal heart sounds.  Pulmonary:     Effort: Pulmonary effort is normal.     Breath sounds: Normal breath sounds.  Abdominal:     General: Bowel sounds are normal.     Palpations: Abdomen is soft.  Musculoskeletal:        General: Normal range of motion.  Skin:    General: Skin is warm and dry.     Capillary Refill: Capillary refill takes less than 2 seconds.     Findings: Wound present.          Comments: Healing wound to right lower anterior leg, without odor or drainage, approximately 3 cm in diameter  Neurological:     General: No focal deficit present.     Mental Status: She is alert and oriented to person, place, and time.  Psychiatric:        Mood and Affect: Mood normal.        Behavior: Behavior normal.       Lab Results  Component Value Date   WBC 6.8 10/15/2020   HGB 13.9 10/15/2020   HCT 42.2 10/15/2020   PLT 378 10/15/2020   GLUCOSE 100 (H) 10/15/2020   CHOL 269 (H) 10/15/2020   TRIG 154 (H) 10/15/2020   HDL 61 10/15/2020   LDLCALC 180 (H) 10/15/2020   ALT 19 10/15/2020   AST 25 10/15/2020   NA 140 10/15/2020   K 5.4 (H) 10/15/2020    CL 98 10/15/2020   CREATININE 1.13 (H) 10/15/2020   BUN 25 10/15/2020   CO2 25 10/15/2020   TSH 3.370 06/25/2020   INR 0.91 02/28/2016   HGBA1C 6.2 (H) 06/25/2020      Assessment & Plan:   1. Mixed hyperlipidemia-not at goal - CBC with Differential/Platelet - Comprehensive metabolic panel - Lipid panel - Semaglutide-Weight Management 0.25 MG/0.5ML SOAJ; Inject 0.25 mg into the skin once a week for 28 days.  Dispense: 2 mL; Refill: 1 - Semaglutide-Weight Management 1 MG/0.5ML SOAJ; Inject 1 mg into the skin once a week for 28 days.  Dispense: 2 mL; Refill: 0 - Semaglutide-Weight Management 1.7 MG/0.75ML SOAJ; Inject 1.7 mg into the skin once a week for 28 days.  Dispense: 3 mL; Refill: 0 - Semaglutide-Weight Management 2.4 MG/0.75ML SOAJ; Inject 2.4 mg into the skin once a week for 28 days.  Dispense: 3 mL; Refill: 0 - Semaglutide-Weight Management 0.5 MG/0.5ML SOAJ; Inject 0.5 mg into the skin once a week for 28 days.  Dispense: 2 mL; Refill: 0 -continue Zetia daily -heart healthy diet -continue physical activity daily  2. Essential hypertension-well controlled - CBC with Differential/Platelet - Comprehensive metabolic panel - TSH - Semaglutide-Weight Management 0.25 MG/0.5ML SOAJ; Inject 0.25 mg into the skin once a week for 28 days.  Dispense: 2 mL; Refill: 1 - Semaglutide-Weight Management 1 MG/0.5ML SOAJ; Inject 1 mg into the skin once a week for 28 days.  Dispense: 2 mL; Refill: 0 - Semaglutide-Weight Management 1.7 MG/0.75ML SOAJ; Inject 1.7 mg  into the skin once a week for 28 days.  Dispense: 3 mL; Refill: 0 - Semaglutide-Weight Management 2.4 MG/0.75ML SOAJ; Inject 2.4 mg into the skin once a week for 28 days.  Dispense: 3 mL; Refill: 0 - Semaglutide-Weight Management 0.5 MG/0.5ML SOAJ; Inject 0.5 mg into the skin once a week for 28 days.  Dispense: 2 mL; Refill: 0 -  3. Leg cramps - CBC with Differential/Platelet - Comprehensive metabolic panel - TSH - Z61 and  Folate Panel - Vitamin D, 25-hydroxy - cyanocobalamin (,VITAMIN B-12,) 1000 MCG/ML injection; Inject 1 mL (1,000 mcg total) into the muscle once a week. Once a week for four weeks, then monthly x 4  Dispense: 4 mL; Refill: 1  4. Encounter for screening mammogram for malignant neoplasm of breast - MM DIGITAL SCREENING BILATERAL; Future  5. Encounter for osteoporosis screening in asymptomatic postmenopausal patient - DG Bone Density; Future  6. B12 deficiency - cyanocobalamin (,VITAMIN B-12,) 1000 MCG/ML injection; Inject 1 mL (1,000 mcg total) into the muscle once a week. Once a week for four weeks, then monthly x 4  Dispense: 4 mL; Refill: 1  7. BMI 26.0-26.9,adult - cyanocobalamin (,VITAMIN B-12,) 1000 MCG/ML injection; Inject 1 mL (1,000 mcg total) into the muscle once a week. Once a week for four weeks, then monthly x 4  Dispense: 4 mL; Refill: 1 - Semaglutide-Weight Management 0.25 MG/0.5ML SOAJ; Inject 0.25 mg into the skin once a week for 28 days.  Dispense: 2 mL; Refill: 1 - Semaglutide-Weight Management 1 MG/0.5ML SOAJ; Inject 1 mg into the skin once a week for 28 days.  Dispense: 2 mL; Refill: 0 - Semaglutide-Weight Management 1.7 MG/0.75ML SOAJ; Inject 1.7 mg into the skin once a week for 28 days.  Dispense: 3 mL; Refill: 0 - Semaglutide-Weight Management 2.4 MG/0.75ML SOAJ; Inject 2.4 mg into the skin once a week for 28 days.  Dispense: 3 mL; Refill: 0 - Semaglutide-Weight Management 0.5 MG/0.5ML SOAJ; Inject 0.5 mg into the skin once a week for 28 days.  Dispense: 2 mL; Refill: 0  8. Overweight (BMI 25.0-29.9) - CBC with Differential/Platelet - Comprehensive metabolic panel - Lipid panel - TSH - B12 and Folate Panel - Vitamin D, 25-hydroxy - Semaglutide-Weight Management 0.25 MG/0.5ML SOAJ; Inject 0.25 mg into the skin once a week for 28 days.  Dispense: 2 mL; Refill: 1 - Semaglutide-Weight Management 1 MG/0.5ML SOAJ; Inject 1 mg into the skin once a week for 28 days.   Dispense: 2 mL; Refill: 0 - Semaglutide-Weight Management 1.7 MG/0.75ML SOAJ; Inject 1.7 mg into the skin once a week for 28 days.  Dispense: 3 mL; Refill: 0 - Semaglutide-Weight Management 2.4 MG/0.75ML SOAJ; Inject 2.4 mg into the skin once a week for 28 days.  Dispense: 3 mL; Refill: 0 - Semaglutide-Weight Management 0.5 MG/0.5ML SOAJ; Inject 0.5 mg into the skin once a week for 28 days.  Dispense: 2 mL; Refill: 0   Continue medications Return for B12 injection weekly for four weeks then monthly Semaglutide [facility]) sent for weight loss, we will send prior authorization We will call you with labs and appt for mammogram and DEXA scan Follow-up in 37-month    Follow-up: nurse visit for B12 injection; follow-up 330-month fasting  An After Visit Summary was printed and given to the patient.  I, ShRip HarbourNP, have reviewed all documentation for this visit. The documentation on 09/12/21 for the exam, diagnosis, procedures, and orders are all accurate and complete.    Signed, ShLarene Beach  Danise Mina, NP [place] [phone]

## 2021-09-13 LAB — COMPREHENSIVE METABOLIC PANEL
ALT: 26 IU/L (ref 0–32)
AST: 30 IU/L (ref 0–40)
Albumin/Globulin Ratio: 1.5 (ref 1.2–2.2)
Albumin: 4.3 g/dL (ref 3.7–4.7)
Alkaline Phosphatase: 101 IU/L (ref 44–121)
BUN/Creatinine Ratio: 23 (ref 12–28)
BUN: 23 mg/dL (ref 8–27)
Bilirubin Total: 0.2 mg/dL (ref 0.0–1.2)
CO2: 26 mmol/L (ref 20–29)
Calcium: 10 mg/dL (ref 8.7–10.3)
Chloride: 100 mmol/L (ref 96–106)
Creatinine, Ser: 1.01 mg/dL — ABNORMAL HIGH (ref 0.57–1.00)
Globulin, Total: 2.9 g/dL (ref 1.5–4.5)
Glucose: 97 mg/dL (ref 70–99)
Potassium: 5.6 mmol/L — ABNORMAL HIGH (ref 3.5–5.2)
Sodium: 139 mmol/L (ref 134–144)
Total Protein: 7.2 g/dL (ref 6.0–8.5)
eGFR: 59 mL/min/{1.73_m2} — ABNORMAL LOW (ref >59)

## 2021-09-13 LAB — CBC WITH DIFFERENTIAL/PLATELET
Basophils Absolute: 0 10*3/uL (ref 0.0–0.2)
Basos: 1 %
EOS (ABSOLUTE): 0.3 10*3/uL (ref 0.0–0.4)
Eos: 5 %
Hematocrit: 38.1 % (ref 34.0–46.6)
Hemoglobin: 13.3 g/dL (ref 11.1–15.9)
Immature Grans (Abs): 0 10*3/uL (ref 0.0–0.1)
Immature Granulocytes: 0 %
Lymphocytes Absolute: 2.2 10*3/uL (ref 0.7–3.1)
Lymphs: 31 %
MCH: 29.8 pg (ref 26.6–33.0)
MCHC: 34.9 g/dL (ref 31.5–35.7)
MCV: 85 fL (ref 79–97)
Monocytes Absolute: 0.6 10*3/uL (ref 0.1–0.9)
Monocytes: 9 %
Neutrophils Absolute: 3.8 10*3/uL (ref 1.4–7.0)
Neutrophils: 54 %
Platelets: 378 10*3/uL (ref 150–450)
RBC: 4.47 x10E6/uL (ref 3.77–5.28)
RDW: 12.8 % (ref 11.7–15.4)
WBC: 6.9 10*3/uL (ref 3.4–10.8)

## 2021-09-13 LAB — TSH: TSH: 3.84 u[IU]/mL (ref 0.450–4.500)

## 2021-09-13 LAB — B12 AND FOLATE PANEL
Folate: 14.8 ng/mL (ref >3.0)
Vitamin B-12: 217 pg/mL — ABNORMAL LOW (ref 232–1245)

## 2021-09-13 LAB — CARDIOVASCULAR RISK ASSESSMENT

## 2021-09-13 LAB — LIPID PANEL
Chol/HDL Ratio: 4.2 ratio (ref 0.0–4.4)
Cholesterol, Total: 246 mg/dL — ABNORMAL HIGH (ref 100–199)
HDL: 59 mg/dL (ref >39)
LDL Chol Calc (NIH): 162 mg/dL — ABNORMAL HIGH (ref 0–99)
Triglycerides: 140 mg/dL (ref 0–149)
VLDL Cholesterol Cal: 25 mg/dL (ref 5–40)

## 2021-09-13 LAB — VITAMIN D 25 HYDROXY (VIT D DEFICIENCY, FRACTURES): Vit D, 25-Hydroxy: 27.5 ng/mL — ABNORMAL LOW (ref 30.0–100.0)

## 2021-09-18 ENCOUNTER — Other Ambulatory Visit: Payer: Self-pay

## 2021-09-18 ENCOUNTER — Encounter: Payer: Self-pay | Admitting: Nurse Practitioner

## 2021-09-18 ENCOUNTER — Other Ambulatory Visit: Payer: Self-pay | Admitting: Nurse Practitioner

## 2021-09-18 DIAGNOSIS — E875 Hyperkalemia: Secondary | ICD-10-CM

## 2021-09-18 DIAGNOSIS — E782 Mixed hyperlipidemia: Secondary | ICD-10-CM

## 2021-09-18 MED ORDER — VITAMIN D (ERGOCALCIFEROL) 1.25 MG (50000 UNIT) PO CAPS: 50000.0000 [IU] | ORAL_CAPSULE | ORAL | 2 refills | Status: DC

## 2021-09-18 MED ORDER — ROSUVASTATIN CALCIUM 5 MG PO TABS: 5.0000 mg | ORAL_TABLET | Freq: Every day | ORAL | 0 refills | Status: DC

## 2021-09-18 MED ORDER — PRAVASTATIN SODIUM 20 MG PO TABS: 20.0000 mg | ORAL_TABLET | Freq: Every day | ORAL | 1 refills | Status: DC

## 2021-09-24 ENCOUNTER — Ambulatory Visit: Payer: Medicare Other

## 2021-09-24 ENCOUNTER — Telehealth: Payer: Self-pay

## 2021-09-24 ENCOUNTER — Other Ambulatory Visit: Payer: Self-pay

## 2021-09-24 DIAGNOSIS — E875 Hyperkalemia: Secondary | ICD-10-CM

## 2021-09-24 LAB — COMPREHENSIVE METABOLIC PANEL
ALT: 21 IU/L (ref 0–32)
AST: 27 IU/L (ref 0–40)
Albumin/Globulin Ratio: 1.6 (ref 1.2–2.2)
Albumin: 4.8 g/dL — ABNORMAL HIGH (ref 3.7–4.7)
Alkaline Phosphatase: 116 IU/L (ref 44–121)
BUN/Creatinine Ratio: 25 (ref 12–28)
BUN: 26 mg/dL (ref 8–27)
Bilirubin Total: 0.2 mg/dL (ref 0.0–1.2)
CO2: 23 mmol/L (ref 20–29)
Calcium: 10.4 mg/dL — ABNORMAL HIGH (ref 8.7–10.3)
Chloride: 98 mmol/L (ref 96–106)
Creatinine, Ser: 1.05 mg/dL — ABNORMAL HIGH (ref 0.57–1.00)
Globulin, Total: 3 g/dL (ref 1.5–4.5)
Glucose: 100 mg/dL — ABNORMAL HIGH (ref 70–99)
Potassium: 5.7 mmol/L — ABNORMAL HIGH (ref 3.5–5.2)
Sodium: 140 mmol/L (ref 134–144)
Total Protein: 7.8 g/dL (ref 6.0–8.5)
eGFR: 56 mL/min/{1.73_m2} — ABNORMAL LOW (ref >59)

## 2021-09-24 NOTE — Telephone Encounter (Signed)
PA for [facility] submitted and denied via covermymeds. Not covered for weight loss.

## 2021-10-04 ENCOUNTER — Other Ambulatory Visit: Payer: Self-pay

## 2021-10-04 MED ORDER — FUROSEMIDE 20 MG PO TABS: 20.0000 mg | ORAL_TABLET | Freq: Every day | ORAL | 3 refills | Status: DC

## 2021-10-18 ENCOUNTER — Other Ambulatory Visit: Payer: Self-pay | Admitting: Nurse Practitioner

## 2021-10-18 ENCOUNTER — Ambulatory Visit: Payer: Medicare Other

## 2021-11-05 ENCOUNTER — Other Ambulatory Visit: Payer: Self-pay

## 2021-11-05 DIAGNOSIS — Z13820 Encounter for screening for osteoporosis: Secondary | ICD-10-CM

## 2021-11-05 DIAGNOSIS — Z1231 Encounter for screening mammogram for malignant neoplasm of breast: Secondary | ICD-10-CM

## 2021-11-12 ENCOUNTER — Other Ambulatory Visit: Payer: Self-pay | Admitting: Nurse Practitioner

## 2021-11-12 DIAGNOSIS — E782 Mixed hyperlipidemia: Secondary | ICD-10-CM

## 2021-11-16 ENCOUNTER — Other Ambulatory Visit: Payer: Self-pay | Admitting: Nurse Practitioner

## 2021-12-12 NOTE — Progress Notes (Signed)
Cancelled.  

## 2021-12-13 ENCOUNTER — Ambulatory Visit (INDEPENDENT_AMBULATORY_CARE_PROVIDER_SITE_OTHER): Payer: Medicare Other | Admitting: Nurse Practitioner

## 2021-12-13 DIAGNOSIS — I10 Essential (primary) hypertension: Secondary | ICD-10-CM

## 2021-12-13 DIAGNOSIS — E782 Mixed hyperlipidemia: Secondary | ICD-10-CM

## 2022-01-05 ENCOUNTER — Other Ambulatory Visit: Payer: Self-pay | Admitting: Nurse Practitioner

## 2022-01-14 ENCOUNTER — Other Ambulatory Visit: Payer: Self-pay | Admitting: Nurse Practitioner

## 2022-01-14 DIAGNOSIS — M1991 Primary osteoarthritis, unspecified site: Secondary | ICD-10-CM

## 2022-01-15 ENCOUNTER — Ambulatory Visit (INDEPENDENT_AMBULATORY_CARE_PROVIDER_SITE_OTHER): Payer: Medicare Other

## 2022-01-15 DIAGNOSIS — Z0000 Encounter for general adult medical examination without abnormal findings: Secondary | ICD-10-CM

## 2022-01-17 NOTE — Progress Notes (Signed)
Subjective:   Kathie Posa is a 71 y.o. female who presents for Medicare Annual (Subsequent) preventive examination.  I connected with  [name] on 01/17/22 by a audio enabled telemedicine application and verified that I am speaking with the correct person using two identifiers.  Patient Location: Home  Provider Location: Office/Clinic  I discussed the limitations of evaluation and management by telemedicine. The patient expressed understanding and agreed to proceed.   Cardiac Risk Factors include: advanced age (>70mn, >>75women);dyslipidemia     Objective:    There were no vitals filed for this visit. There is no height or weight on file to calculate BMI.     02/28/2016    3:20 PM  Advanced Directives  Does Patient Have a Medical Advance Directive? Yes  Copy of [facility] [place] Chart? No - copy requested    Current Medications (verified) Outpatient Encounter Medications as of 01/15/2022  Medication Sig   benazepril (LOTENSIN) 20 MG tablet Take 1 tablet (20 mg total) by mouth daily.   celecoxib (CELEBREX) 100 MG capsule Take 1 capsule (100 mg total) by mouth daily.   cyanocobalamin (,VITAMIN B-12,) 1000 MCG/ML injection Inject 1 mL (1,000 mcg total) into the muscle once a week. Once a week for four weeks, then monthly x 4   furosemide (LASIX) 20 MG tablet Take 1 tablet (20 mg total) by mouth daily.   hydrochlorothiazide (HYDRODIURIL) 25 MG tablet TAKE ONE TABLET BY MOUTH EVERY DAY   pramipexole (MIRAPEX) 0.25 MG tablet TAKE 1 TABLET BY MOUTH EVERY NIGHT AT BEDTIME   Semaglutide-Weight Management 2.4 MG/0.75ML SOAJ Inject 2.4 mg into the skin once a week for 28 days.   Vitamin D, Ergocalciferol, (DRISDOL) 1.25 MG (50000 UNIT) CAPS capsule Take 1 capsule (50,000 Units total) by mouth every 7 (seven) days.   [DISCONTINUED] ezetimibe (ZETIA) 10 MG tablet TAKE ONE TABLET BY MOUTH DAILY (Patient not taking: Reported on 01/17/2022)   [DISCONTINUED] pravastatin  (PRAVACHOL) 20 MG tablet Take 1 tablet (20 mg total) by mouth daily. (Patient not taking: Reported on 01/17/2022)   No facility-administered encounter medications on file as of 01/15/2022.    Allergies (verified) Prednisone, Statins, Zetia [ezetimibe], and No known allergies   History: Past Medical History:  Diagnosis Date   Arthritis    [place]    pre cancerous skin cancer with multiple lesions removed   Complication of anesthesia    broken teeth   Essential (primary) hypertension    Generalized anxiety disorder    Hyperkalemia    Hypertension    Past Surgical History:  Procedure Laterality Date   SKIN CANCER EXCISION Bilateral    legs   TOTAL HIP ARTHROPLASTY Right    5 yrs ago   TOTAL KNEE ARTHROPLASTY Right 03/10/2016   Procedure: RIGHT TOTAL KNEE ARTHROPLASTY;  Surgeon: SVickey Huger MD;  [place] [facility]  Service: Orthopedics;  Laterality: Right;   Family History  Problem Relation Age of Onset   Stroke Father    Cerebrovascular Accident Other    Diabetes Mellitus II Other    Hyperlipidemia Other    Hypertension Other    Social History   Socioeconomic History   Marital status: Married    Spouse name: Not on file   Number of children: Not on file   Years of education: Not on file   Highest education level: Not on file  Occupational History   Not on file  Tobacco Use   Smoking status: Never  Smokeless tobacco: Never  Vaping Use   Vaping Use: Never used  Substance and Sexual Activity   Alcohol use: Yes    Comment: Drinks on a social basis.   Drug use: No   Sexual activity: Not on file  Other Topics Concern   Not on file  Social History Narrative   Not on file   Social Determinants of Health   Financial Resource Strain: Not on file  Food Insecurity: No Food Insecurity   Worried About Running Out of Food in the Last Year: Never true   Ran Out of Food in the Last Year: Never true  Transportation Needs: Not on file  Physical Activity: Not on file   Stress: Not on file  Social Connections: Not on file    Tobacco Counseling Counseling given: Patient does not use tobacco products   Clinical Intake:  Pre-visit preparation completed: Yes Pain : No/denies pain   Nutritional Risks: None Diabetes: No How often do you need to have someone help you when you read instructions, pamphlets, or other written materials from your doctor or pharmacy?: 1 - Never Interpreter Needed?: No    Activities of Daily Living    01/17/2022   10:12 AM  In your present state of health, do you have any difficulty performing the following activities:  Hearing? 0  Vision? 0  Difficulty concentrating or making decisions? 0  Walking or climbing stairs? 0  Dressing or bathing? 0  Doing errands, shopping? 0  Preparing Food and eating ? N  Using the Toilet? N  In the past six months, have you accidently leaked urine? N  Do you have problems with loss of bowel control? N  Managing your Medications? N  Managing your Finances? N  Housekeeping or managing your Housekeeping? N    Patient Care Team: Rip Harbour, NP as PCP - General (Nurse Practitioner)     Assessment:   This is a routine wellness examination for Vernee.  Hearing/Vision screen No results found.  Dietary issues and exercise activities discussed: Current Exercise Habits: Home exercise routine;Structured exercise class, Type of exercise: Other - see comments;walking;strength training/weights (biking and hoola-hooping), Time (Minutes): 60, Frequency (Times/Week): 6, Weekly Exercise (Minutes/Week): 360, Intensity: Moderate, Exercise limited by: None identified   Goals Addressed             This Visit's Progress    DIET - REDUCE CALORIE INTAKE   On track    Pt is following 56 days eating plan       Depression Screen    01/17/2022   10:06 AM 09/12/2021    7:43 AM 07/02/2020    9:19 AM 06/25/2020   10:32 AM  PHQ 2/9 Scores  PHQ - 2 Score 0 0 0 0  PHQ- 9 Score   0     Fall  Risk    01/17/2022   10:12 AM 09/12/2021    7:43 AM 07/02/2020    9:19 AM 06/25/2020   11:51 AM  [place] in the past year? 0 0 0 0  Number falls in past yr: 0 0 0 0  Injury with Fall? 0 0 0 0  Risk for fall due to : No Fall Risks No Fall Risks    Follow up Falls evaluation completed;Education provided Falls evaluation completed      [place]:  Home free of loose throw rugs in walkways, pet beds, electrical cords, etc? Yes  Adequate lighting in  your home to reduce risk of falls? Yes   ASSISTIVE DEVICES UTILIZED TO PREVENT FALLS:  Life alert? No  Use of a cane, walker or w/c? No  Grab bars in the bathroom? No  Shower chair or bench in shower? No  Elevated toilet seat or a handicapped toilet? No    Cognitive Function:        01/17/2022   10:16 AM 07/02/2020    9:17 AM  6CIT Screen  What Year? 0 points 0 points  What month? 0 points 0 points  What time? 0 points 0 points  Count back from 20 0 points 0 points  Months in reverse 0 points 0 points  Repeat phrase 0 points 0 points  Total Score 0 points 0 points    Immunizations Immunization History  Administered Date(s) Administered   Fluad Quad(high Dose 65+) 06/25/2020   Influenza-Unspecified 05/31/2021   Moderna Covid-19 Vaccine Bivalent Booster 13yr & up 05/31/2021   Moderna Sars-Covid-2 Vaccination 07/20/2020   Pneumococcal Polysaccharide-23 07/02/2020   Zoster, Live 07/04/2013    TDAP status: Due, Education has been provided regarding the importance of this vaccine. Advised may receive this vaccine at local pharmacy or Health Dept. Aware to provide a copy of the vaccination record if obtained from local pharmacy or Health Dept. Verbalized acceptance and understanding.  Flu Vaccine status: Up to date  Pneumococcal vaccine status: Due, Education has been provided regarding the importance of this vaccine. Advised may receive this vaccine at local pharmacy or Health Dept.  Aware to provide a copy of the vaccination record if obtained from local pharmacy or Health Dept. Verbalized acceptance and understanding.  Covid-19 vaccine status: Information provided on how to obtain vaccines.   Screening Tests Health Maintenance  Topic Date Due   TETANUS/TDAP  Never done   Zoster Vaccines- Shingrix (1 of 2) Never done   COVID-19 Vaccine (2 - Moderna risk series) 05/31/2021   Pneumonia Vaccine 73 Years old (2 - PCV) 07/02/2021   COLONOSCOPY (Pts 45-459yrInsurance coverage will need to be confirmed)  09/12/2022 (Originally 08/22/2017)   INFLUENZA VACCINE  03/18/2022   MAMMOGRAM  11/01/2023   DEXA SCAN  Completed   HPV VACCINES  Aged Out   Hepatitis C Screening  Discontinued    Health Maintenance  Health Maintenance Due  Topic Date Due   TETANUS/TDAP  Never done   Zoster Vaccines- Shingrix (1 of 2) Never done   COVID-19 Vaccine (2 - Moderna risk series) 05/31/2021   Pneumonia Vaccine 6567Years old (2 - PCV) 07/02/2021    Colorectal cancer screening: Type of screening: Colonoscopy. Completed 2009. Repeat every 10 years  Mammogram status: Completed 10/31/21. Repeat every year  Bone Density status: Completed 10/31/21. Results reflect: Bone density results: OSTEOPENIA. Repeat every 2 years.  Lung Cancer Screening: (Low Dose CT Chest recommended if Age 73-80ears, 30 pack-year currently smoking OR have quit w/in 15years.) does not qualify.    Additional Screening:  Vision Screening: Recommended annual ophthalmology exams for early detection of glaucoma and other disorders of the eye. Is the patient up to date with their annual eye exam?  Yes   Dental Screening: Recommended annual dental exams for proper oral hygiene    Plan:    1- Mammogram and DEXA are up-to-date 2- PNA, Tetanus, Shingrix, and COVID booster recommended - patient recalls having some of these however I cannot find documentation on the [facility], [facility], or with [facility] 3- Patient cannot  tolerate Zetia or Pravastatin  I have personally reviewed and noted the following in the patient's chart:   Medical and social history Use of alcohol, tobacco or illicit drugs  Current medications and supplements including opioid prescriptions.  Functional ability and status Nutritional status Physical activity Advanced directives List of other physicians Hospitalizations, surgeries, and ER visits in previous 12 months Screenings to include cognitive, depression, and falls Referrals and appointments  In addition, I have reviewed and discussed with patient certain preventive protocols, quality metrics, and best practice recommendations. A written personalized care plan for preventive services as well as general preventive health recommendations were provided to patient.     Erie Noe, LPN   [id number]

## 2022-01-17 NOTE — Patient Instructions (Signed)
[name] , Thank you for taking time to come for your Medicare Wellness Visit. I appreciate your ongoing commitment to your health goals. Please review the following plan we discussed and let me know if I can assist you in the future.   Screening recommendations/referrals: Colonoscopy: Due Mammogram: Next due on 11/02/22 Bone Density: Next due after 11/02/23 Recommended yearly ophthalmology/optometry visit for glaucoma screening and checkup Recommended yearly dental visit for hygiene and checkup  Vaccinations: Influenza vaccine: Due fall 2023 Pneumococcal vaccine: Prevnar 20 due Tdap vaccine: Due (I can't find any records from [facility] for past vaccine) Shingles vaccine: Shingrix due    Advanced directives: please bring a copy for your medical record   Preventive Care 73 Years and Older, Female Preventive care refers to lifestyle choices and visits with your health care provider that can promote health and wellness. What does preventive care include? A yearly physical exam. This is also called an annual well check. Dental exams once or twice a year. Routine eye exams. Ask your health care provider how often you should have your eyes checked. Personal lifestyle choices, including: Daily care of your teeth and gums. Regular physical activity. Eating a healthy diet. Avoiding tobacco and drug use. Limiting alcohol use. Practicing safe sex. Taking low-dose aspirin every day. Taking vitamin and mineral supplements as recommended by your health care provider. What happens during an annual well check? The services and screenings done by your health care provider during your annual well check will depend on your age, overall health, lifestyle risk factors, and family history of disease. Counseling  Your health care provider may ask you questions about your: Alcohol use. Tobacco use. Drug use. Emotional well-being. Home and relationship well-being. Sexual activity. Eating  habits. History of falls. Memory and ability to understand (cognition). Work and work Statistician. Reproductive health. Screening  You may have the following tests or measurements: Height, weight, and BMI. Blood pressure. Lipid and cholesterol levels. These may be checked every 5 years, or more frequently if you are over 32 years old. Skin check. Lung cancer screening. You may have this screening every year starting at age 55 if you have a 30-pack-year history of smoking and currently smoke or have quit within the past 15 years. Fecal occult blood test (FOBT) of the stool. You may have this test every year starting at age 36. Flexible sigmoidoscopy or colonoscopy. You may have a sigmoidoscopy every 5 years or a colonoscopy every 10 years starting at age 82. Hepatitis C blood test. Hepatitis B blood test. Sexually transmitted disease (STD) testing. Diabetes screening. This is done by checking your blood sugar (glucose) after you have not eaten for a while (fasting). You may have this done every 1-3 years. Bone density scan. This is done to screen for osteoporosis. You may have this done starting at age 73. Mammogram. This may be done every 1-2 years. Talk to your health care provider about how often you should have regular mammograms. Talk with your health care provider about your test results, treatment options, and if necessary, the need for more tests. Vaccines  Your health care provider may recommend certain vaccines, such as: Influenza vaccine. This is recommended every year. Tetanus, diphtheria, and acellular pertussis (Tdap, Td) vaccine. You may need a Td booster every 10 years. Zoster vaccine. You may need this after age 90. Pneumococcal 13-valent conjugate (PCV13) vaccine. One dose is recommended after age 12. Pneumococcal polysaccharide (PPSV23) vaccine. One dose is recommended after age 25. Talk to your  health care provider about which screenings and vaccines you need and how  often you need them. This information is not intended to replace advice given to you by your health care provider. Make sure you discuss any questions you have with your health care provider. Document Released: 08/31/2015 Document Revised: 04/23/2016 Document Reviewed: 06/05/2015 Elsevier Interactive Patient Education  2017 [place] Prevention in the Home Falls can cause injuries. They can happen to people of all ages. There are many things you can do to make your home safe and to help prevent falls. What can I do on the outside of my home? Regularly fix the edges of walkways and driveways and fix any cracks. Remove anything that might make you trip as you walk through a door, such as a raised step or threshold. Trim any bushes or trees on the path to your home. Use bright outdoor lighting. Clear any walking paths of anything that might make someone trip, such as rocks or tools. Regularly check to see if handrails are loose or broken. Make sure that both sides of any steps have handrails. Any raised decks and porches should have guardrails on the edges. Have any leaves, snow, or ice cleared regularly. Use sand or salt on walking paths during winter. Clean up any spills in your garage right away. This includes oil or grease spills. What can I do in the bathroom? Use night lights. Install grab bars by the toilet and in the tub and shower. Do not use towel bars as grab bars. Use non-skid mats or decals in the tub or shower. If you need to sit down in the shower, use a plastic, non-slip stool. Keep the floor dry. Clean up any water that spills on the floor as soon as it happens. Remove soap buildup in the tub or shower regularly. Attach bath mats securely with double-sided non-slip rug tape. Do not have throw rugs and other things on the floor that can make you trip. What can I do in the bedroom? Use night lights. Make sure that you have a light by your bed that is easy to  reach. Do not use any sheets or blankets that are too big for your bed. They should not hang down onto the floor. Have a firm chair that has side arms. You can use this for support while you get dressed. Do not have throw rugs and other things on the floor that can make you trip. What can I do in the kitchen? Clean up any spills right away. Avoid walking on wet floors. Keep items that you use a lot in easy-to-reach places. If you need to reach something above you, use a strong step stool that has a grab bar. Keep electrical cords out of the way. Do not use floor polish or wax that makes floors slippery. If you must use wax, use non-skid floor wax. Do not have throw rugs and other things on the floor that can make you trip. What can I do with my stairs? Do not leave any items on the stairs. Make sure that there are handrails on both sides of the stairs and use them. Fix handrails that are broken or loose. Make sure that handrails are as long as the stairways. Check any carpeting to make sure that it is firmly attached to the stairs. Fix any carpet that is loose or worn. Avoid having throw rugs at the top or bottom of the stairs. If you do have throw rugs, attach them  to the floor with carpet tape. Make sure that you have a light switch at the top of the stairs and the bottom of the stairs. If you do not have them, ask someone to add them for you. What else can I do to help prevent falls? Wear shoes that: Do not have high heels. Have rubber bottoms. Are comfortable and fit you well. Are closed at the toe. Do not wear sandals. If you use a stepladder: Make sure that it is fully opened. Do not climb a closed stepladder. Make sure that both sides of the stepladder are locked into place. Ask someone to hold it for you, if possible. Clearly mark and make sure that you can see: Any grab bars or handrails. First and last steps. Where the edge of each step is. Use tools that help you move  around (mobility aids) if they are needed. These include: Canes. Walkers. Scooters. Crutches. Turn on the lights when you go into a dark area. Replace any light bulbs as soon as they burn out. Set up your furniture so you have a clear path. Avoid moving your furniture around. If any of your floors are uneven, fix them. If there are any pets around you, be aware of where they are. Review your medicines with your doctor. Some medicines can make you feel dizzy. This can increase your chance of falling. Ask your doctor what other things that you can do to help prevent falls. This information is not intended to replace advice given to you by your health care provider. Make sure you discuss any questions you have with your health care provider. Document Released: 05/31/2009 Document Revised: 01/10/2016 Document Reviewed: 09/08/2014 Elsevier Interactive Patient Education  2017 [employer].

## 2022-01-28 ENCOUNTER — Ambulatory Visit (INDEPENDENT_AMBULATORY_CARE_PROVIDER_SITE_OTHER): Payer: Medicare Other | Admitting: Nurse Practitioner

## 2022-01-28 ENCOUNTER — Encounter: Payer: Self-pay | Admitting: Nurse Practitioner

## 2022-01-28 VITALS — BP 142/80 | HR 85 | Temp 97.6°F | Ht 70.0 in | Wt 184.4 lb

## 2022-01-28 DIAGNOSIS — J018 Other acute sinusitis: Secondary | ICD-10-CM

## 2022-01-28 MED ORDER — AZITHROMYCIN 250 MG PO TABS: ORAL_TABLET | ORAL | 0 refills | Status: AC

## 2022-01-28 MED ORDER — AZELASTINE HCL 0.1 % NA SOLN: 2.0000 | Freq: Two times a day (BID) | NASAL | 12 refills | Status: DC

## 2022-01-28 NOTE — Patient Instructions (Addendum)
Rest and push fluids Take Z-pack as directed Use Astelin nasal spray daily Take Mucinex-D every 12 hours as needed Follow-up as needed    Sinus Infection, Adult A sinus infection is soreness and swelling (inflammation) of your sinuses. Sinuses are hollow spaces in the bones around your face. They are located: Around your eyes. In the middle of your forehead. Behind your nose. In your cheekbones. Your sinuses and nasal passages are lined with a fluid called mucus. Mucus drains out of your sinuses. Swelling can trap mucus in your sinuses. This lets germs (bacteria, virus, or fungus) grow, which leads to infection. Most of the time, this condition is caused by a virus. What are the causes? Allergies. Asthma. Germs. Things that block your nose or sinuses. Growths in the nose (nasal polyps). Chemicals or irritants in the air. A fungus. This is rare. What increases the risk? Having a weak body defense system (immune system). Doing a lot of swimming or diving. Using nasal sprays too much. Smoking. What are the signs or symptoms? The main symptoms of this condition are pain and a feeling of pressure around the sinuses. Other symptoms include: Stuffy nose (congestion). This may make it hard to breathe through your nose. Runny nose (drainage). Soreness, swelling, and warmth in the sinuses. A cough that may get worse at night. Being unable to smell and taste. Mucus that collects in the throat or the back of the nose (postnasal drip). This may cause a sore throat or bad breath. Being very tired (fatigued). A fever. How is this diagnosed? Your symptoms. Your medical history. A physical exam. Tests to find out if your condition is short-term (acute) or long-term (chronic). Your doctor may: Check your nose for growths (polyps). Check your sinuses using a tool that has a light on one end (endoscope). Check for allergies or germs. Do imaging tests, such as an MRI or CT scan. How is this  treated? Treatment for this condition depends on the cause and whether it is short-term or long-term. If caused by a virus, your symptoms should go away on their own within 10 days. You may be given medicines to relieve symptoms. They include: Medicines that shrink swollen tissue in the nose. A spray that treats swelling of the nostrils. Rinses that help get rid of thick mucus in your nose (nasal saline washes). Medicines that treat allergies (antihistamines). Over-the-counter pain relievers. If caused by bacteria, your doctor may wait to see if you will get better without treatment. You may be given antibiotic medicine if you have: A very bad infection. A weak body defense system. If caused by growths in the nose, surgery may be needed. Follow these instructions at home: Medicines Take, use, or apply over-the-counter and prescription medicines only as told by your doctor. These may include nasal sprays. If you were prescribed an antibiotic medicine, take it as told by your doctor. Do not stop taking it even if you start to feel better. Hydrate and humidify  Drink enough water to keep your pee (urine) pale yellow. Use a cool mist humidifier to keep the humidity level in your home above 50%. Breathe in steam for 10-15 minutes, 3-4 times a day, or as told by your doctor. You can do this in the bathroom while a hot shower is running. Try not to spend time in cool or dry air. Rest Rest as much as you can. Sleep with your head raised (elevated). Make sure you get enough sleep each night. General instructions  Put  a warm, moist washcloth on your face 3-4 times a day, or as often as told by your doctor. Use nasal saline washes as often as told by your doctor. Wash your hands often with soap and water. If you cannot use soap and water, use hand sanitizer. Do not smoke. Avoid being around people who are smoking (secondhand smoke). Keep all follow-up visits. Contact a doctor if: You have a  fever. Your symptoms get worse. Your symptoms do not get better within 10 days. Get help right away if: You have a very bad headache. You cannot stop vomiting. You have very bad pain or swelling around your face or eyes. You have trouble seeing. You feel confused. Your neck is stiff. You have trouble breathing. These symptoms may be an emergency. Get help right away. Call 911. Do not wait to see if the symptoms will go away. Do not drive yourself to the hospital. Summary A sinus infection is swelling of your sinuses. Sinuses are hollow spaces in the bones around your face. This condition is caused by tissues in your nose that become inflamed or swollen. This traps germs. These can lead to infection. If you were prescribed an antibiotic medicine, take it as told by your doctor. Do not stop taking it even if you start to feel better. Keep all follow-up visits. This information is not intended to replace advice given to you by your health care provider. Make sure you discuss any questions you have with your health care provider. Document Revised: 07/09/2021 Document Reviewed: 07/09/2021 Elsevier Patient Education  [place].

## 2022-01-28 NOTE — Progress Notes (Signed)
Acute Office Visit  Subjective:    Patient ID: Susan Roberson, female    DOB: [birth year], 71 y.o.   MRN: [id number]  CC: URI  HPI: Patient is in today for Upper respiratory symptoms She complains of congestion, headache, nasal congestion, non productive cough, and post nasal drip. Denies fever, chills, rash, or body aches.Onset of symptoms was a few days ago and worsening.Treatment has included Alka-Seltzer and Mucinex-D.   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   Complication of anesthesia    broken teeth   Essential (primary) hypertension    Generalized anxiety disorder    Hyperkalemia    Hypertension     Past Surgical History:  Procedure Laterality Date   SKIN CANCER EXCISION Bilateral    legs   TOTAL HIP ARTHROPLASTY Right    5 yrs ago   TOTAL KNEE ARTHROPLASTY Right 03/10/2016   Procedure: RIGHT TOTAL KNEE ARTHROPLASTY;  Surgeon: Vickey Huger, MD;  Location: [facility];  Service: Orthopedics;  Laterality: Right;    Family History  Problem Relation Age of Onset   Stroke Father    Cerebrovascular Accident Other    Diabetes Mellitus II Other    Hyperlipidemia Other    Hypertension Other     Social History   Socioeconomic History   Marital status: Married    Spouse name: Not on file   Number of children: Not on file   Years of education: Not on file   Highest education level: Not on file  Occupational History   Not on file  Tobacco Use   Smoking status: Never   Smokeless tobacco: Never  Vaping Use   Vaping Use: Never used  Substance and Sexual Activity   Alcohol use: Yes    Comment: Drinks on a social basis.   Drug use: No   Sexual activity: Not on file  Other Topics Concern   Not on file  Social History Narrative   Not on file   Social Determinants of Health   Financial Resource Strain: Low Risk  (07/02/2020)   Overall Financial Resource Strain (CARDIA)     Difficulty of Paying Living Expenses: Not hard at all  Food Insecurity: No Food Insecurity (01/17/2022)   Hunger Vital Sign    Worried About Running Out of Food in the Last Year: Never true    Ran Out of Food in the Last Year: Never true  Transportation Needs: No Transportation Needs (07/02/2020)   PRAPARE - Hydrologist (Medical): No    Lack of Transportation (Non-Medical): No  Physical Activity: Sufficiently Active (07/02/2020)   Exercise Vital Sign    Days of Exercise per Week: 6 days    Minutes of Exercise per Session: 30 min  Stress: No Stress Concern Present (07/02/2020)   [place]    Feeling of Stress : Not at all  Social Connections: [place] (07/02/2020)   Social Connection and Isolation Panel [NHANES]    Frequency of Communication with Friends and Family: More than three times a week    Frequency of Social Gatherings with Friends and Family: More than three times a week    Attends Religious Services: More than 4 times per year    Active Member of [employer] or Organizations: Yes    Attends Archivist Meetings: More than 4 times per year  Marital Status: Married  Human resources officer Violence: Not At Risk (07/02/2020)   Humiliation, Afraid, Rape, and Kick questionnaire    Fear of Current or Ex-Partner: No    Emotionally Abused: No    Physically Abused: No    Sexually Abused: No    Outpatient Medications Prior to Visit  Medication Sig Dispense Refill   benazepril (LOTENSIN) 20 MG tablet Take 1 tablet (20 mg total) by mouth daily. 90 tablet 0   celecoxib (CELEBREX) 100 MG capsule Take 1 capsule (100 mg total) by mouth daily. 90 capsule 0   cyanocobalamin (,VITAMIN B-12,) 1000 MCG/ML injection Inject 1 mL (1,000 mcg total) into the muscle once a week. Once a week for four weeks, then monthly x 4 4 mL 1   furosemide (LASIX) 20 MG tablet Take 1 tablet (20 mg total) by  mouth daily. 30 tablet 3   hydrochlorothiazide (HYDRODIURIL) 25 MG tablet TAKE ONE TABLET BY MOUTH EVERY DAY 79 tablet 0   pramipexole (MIRAPEX) 0.25 MG tablet TAKE 1 TABLET BY MOUTH EVERY NIGHT AT BEDTIME 90 tablet 2   Semaglutide-Weight Management 2.4 MG/0.75ML SOAJ Inject 2.4 mg into the skin once a week for 28 days. 3 mL 0   Vitamin D, Ergocalciferol, (DRISDOL) 1.25 MG (50000 UNIT) CAPS capsule Take 1 capsule (50,000 Units total) by mouth every 7 (seven) days. 5 capsule 2   No facility-administered medications prior to visit.    Allergies  Allergen Reactions   Prednisone     Caused bilateral cataracts immediately   Statins    Zetia [Ezetimibe]    No Known Allergies     Review of Systems    See pertinent positives and negatives per HPI.  Objective:    Physical Exam Vitals reviewed.  Constitutional:      Appearance: She is ill-appearing.  HENT:     Right Ear: Tympanic membrane normal.     Left Ear: Tympanic membrane normal.     Nose: Congestion and rhinorrhea present.     Mouth/Throat:     Pharynx: Posterior oropharyngeal erythema present.  Cardiovascular:     Pulses: Normal pulses.     Heart sounds: Normal heart sounds.  Pulmonary:     Effort: Pulmonary effort is normal.     Breath sounds: Normal breath sounds.  Skin:    General: Skin is warm and dry.     Capillary Refill: Capillary refill takes less than 2 seconds.  Neurological:     General: No focal deficit present.     Mental Status: She is alert and oriented to person, place, and time.     BP (!) 142/80   Pulse 85   Temp 97.6 F (36.4 C)   Ht 5' 10"  (1.778 m)   Wt 184 lb 6.4 oz (83.6 kg)   SpO2 96%   BMI 26.46 kg/m   Wt Readings from Last 3 Encounters:  09/12/21 189 lb (85.7 kg)  02/14/21 190 lb 3.2 oz (86.3 kg)  01/31/21 190 lb (86.2 kg)    Health Maintenance Due  Topic Date Due   TETANUS/TDAP  Never done   Zoster Vaccines- Shingrix (1 of 2) Never done   COVID-19 Vaccine (3 - Moderna risk  series) 06/28/2021   Pneumonia Vaccine 62+ Years old (2 - PCV) 07/02/2021    There are no preventive care reminders to display for this patient.   Lab Results  Component Value Date   TSH 3.840 09/12/2021   Lab Results  Component Value Date  WBC 6.9 09/12/2021   HGB 13.3 09/12/2021   HCT 38.1 09/12/2021   MCV 85 09/12/2021   PLT 378 09/12/2021   Lab Results  Component Value Date   NA 140 09/24/2021   K 5.7 (H) 09/24/2021   CO2 23 09/24/2021   GLUCOSE 100 (H) 09/24/2021   BUN 26 09/24/2021   CREATININE 1.05 (H) 09/24/2021   BILITOT 0.2 09/24/2021   ALKPHOS 116 09/24/2021   AST 27 09/24/2021   ALT 21 09/24/2021   PROT 7.8 09/24/2021   ALBUMIN 4.8 (H) 09/24/2021   CALCIUM 10.4 (H) 09/24/2021   ANIONGAP 8 03/11/2016   EGFR 56 (L) 09/24/2021   Lab Results  Component Value Date   CHOL 246 (H) 09/12/2021   Lab Results  Component Value Date   HDL 59 09/12/2021   Lab Results  Component Value Date   LDLCALC 162 (H) 09/12/2021   Lab Results  Component Value Date   TRIG 140 09/12/2021   Lab Results  Component Value Date   CHOLHDL 4.2 09/12/2021   Lab Results  Component Value Date   HGBA1C 6.2 (H) 06/25/2020       Assessment & Plan:   1. Acute non-recurrent sinusitis of other sinus - azelastine (ASTELIN) 0.1 % nasal spray; Place 2 sprays into both nostrils 2 (two) times daily. Use in each nostril as directed  Dispense: 30 mL; Refill: 12 - azithromycin (ZITHROMAX) 250 MG tablet; Take 2 tablets on day 1, then 1 tablet daily on days 2 through 5  Dispense: 6 tablet; Refill: 0      Rest and push fluids Take Z-pack as directed Use Astelin nasal spray daily Take Mucinex-D every 12 hours as needed Follow-up as needed  Follow-up: PRN  An After Visit Summary was printed and given to the patient.  I, Rip Harbour, NP, have reviewed all documentation for this visit. The documentation on 01/28/22 for the exam, diagnosis, procedures, and orders are all  accurate and complete.    Signed, Rip Harbour, NP [place] [phone]

## 2022-03-03 ENCOUNTER — Other Ambulatory Visit: Payer: Self-pay | Admitting: Nurse Practitioner

## 2022-03-17 ENCOUNTER — Other Ambulatory Visit: Payer: Self-pay | Admitting: Nurse Practitioner

## 2022-03-17 DIAGNOSIS — E538 Deficiency of other specified B group vitamins: Secondary | ICD-10-CM

## 2022-03-17 DIAGNOSIS — R252 Cramp and spasm: Secondary | ICD-10-CM

## 2022-03-17 DIAGNOSIS — Z6826 Body mass index (BMI) 26.0-26.9, adult: Secondary | ICD-10-CM

## 2022-04-06 ENCOUNTER — Other Ambulatory Visit: Payer: Self-pay | Admitting: Nurse Practitioner

## 2022-04-14 ENCOUNTER — Other Ambulatory Visit: Payer: Self-pay | Admitting: Nurse Practitioner

## 2022-04-14 DIAGNOSIS — Z6826 Body mass index (BMI) 26.0-26.9, adult: Secondary | ICD-10-CM

## 2022-04-14 DIAGNOSIS — R252 Cramp and spasm: Secondary | ICD-10-CM

## 2022-04-14 DIAGNOSIS — M1991 Primary osteoarthritis, unspecified site: Secondary | ICD-10-CM

## 2022-04-14 DIAGNOSIS — E538 Deficiency of other specified B group vitamins: Secondary | ICD-10-CM

## 2022-05-13 ENCOUNTER — Other Ambulatory Visit: Payer: Self-pay | Admitting: Nurse Practitioner

## 2022-05-13 DIAGNOSIS — Z6826 Body mass index (BMI) 26.0-26.9, adult: Secondary | ICD-10-CM

## 2022-05-13 DIAGNOSIS — E538 Deficiency of other specified B group vitamins: Secondary | ICD-10-CM

## 2022-05-13 DIAGNOSIS — R252 Cramp and spasm: Secondary | ICD-10-CM

## 2022-05-14 ENCOUNTER — Other Ambulatory Visit: Payer: Self-pay | Admitting: Nurse Practitioner

## 2022-06-13 ENCOUNTER — Other Ambulatory Visit: Payer: Self-pay | Admitting: Nurse Practitioner

## 2022-06-17 ENCOUNTER — Other Ambulatory Visit: Payer: Self-pay | Admitting: Nurse Practitioner

## 2022-07-02 ENCOUNTER — Other Ambulatory Visit: Payer: Self-pay | Admitting: Nurse Practitioner

## 2022-07-14 ENCOUNTER — Other Ambulatory Visit: Payer: Self-pay | Admitting: Nurse Practitioner

## 2022-07-23 ENCOUNTER — Other Ambulatory Visit: Payer: Self-pay | Admitting: Nurse Practitioner

## 2022-08-12 ENCOUNTER — Other Ambulatory Visit: Payer: Self-pay | Admitting: Nurse Practitioner

## 2022-08-25 NOTE — Assessment & Plan Note (Addendum)
Well controlled Continue Benazepril 20 mg and Hydrochlorothiazide 25 mg Will do lab work today  Nutrition: Stressed importance of moderation in sodium intake, saturated fat and cholesterol, caloric balance, sufficient intake of complex carbohydrates, fiber, calcium and iron.   Exercise: Stressed the importance of regular exercise.

## 2022-08-26 ENCOUNTER — Encounter: Payer: Self-pay | Admitting: Nurse Practitioner

## 2022-08-26 ENCOUNTER — Other Ambulatory Visit: Payer: Self-pay | Admitting: Nurse Practitioner

## 2022-08-26 ENCOUNTER — Ambulatory Visit (INDEPENDENT_AMBULATORY_CARE_PROVIDER_SITE_OTHER): Payer: Medicare Other | Admitting: Nurse Practitioner

## 2022-08-26 VITALS — BP 130/80 | HR 85 | Temp 97.2°F | Resp 16 | Ht 69.0 in | Wt 185.0 lb

## 2022-08-26 DIAGNOSIS — M1991 Primary osteoarthritis, unspecified site: Secondary | ICD-10-CM

## 2022-08-26 DIAGNOSIS — R739 Hyperglycemia, unspecified: Secondary | ICD-10-CM

## 2022-08-26 DIAGNOSIS — I87303 Chronic venous hypertension (idiopathic) without complications of bilateral lower extremity: Secondary | ICD-10-CM | POA: Diagnosis not present

## 2022-08-26 DIAGNOSIS — E782 Mixed hyperlipidemia: Secondary | ICD-10-CM

## 2022-08-26 DIAGNOSIS — I10 Essential (primary) hypertension: Secondary | ICD-10-CM | POA: Diagnosis not present

## 2022-08-26 DIAGNOSIS — E538 Deficiency of other specified B group vitamins: Secondary | ICD-10-CM

## 2022-08-26 HISTORY — DX: Deficiency of other specified B group vitamins: E53.8

## 2022-08-26 HISTORY — DX: Mixed hyperlipidemia: E78.2

## 2022-08-26 MED ORDER — FUROSEMIDE 20 MG PO TABS: 20.0000 mg | ORAL_TABLET | Freq: Every day | ORAL | 1 refills | Status: DC

## 2022-08-26 MED ORDER — BENAZEPRIL HCL 20 MG PO TABS: 20.0000 mg | ORAL_TABLET | Freq: Every day | ORAL | 1 refills | Status: DC

## 2022-08-26 NOTE — Assessment & Plan Note (Signed)
Patient's last A1C was 6.2 Will repeat A1C today  Nutrition: Stressed importance of moderation in sodium intake, saturated fat and cholesterol, caloric balance, sufficient intake of complex carbohydrates, fiber, calcium and iron.   Exercise: Stressed the importance of regular exercise.

## 2022-08-26 NOTE — Progress Notes (Addendum)
Subjective:  Patient ID: Susan Roberson, female    DOB: 1951/06/16  Age: 72 y.o. MRN: [id number]  Chief Complaints: For follow up  History of Present illness:  Patient is here for a regular follow up for her ongoing problems. She denies any significant complain this time. She has tried statins and Zetia in past but that gave her whole body muscle cramps so she had to stop it . Now she is not on any medicine for hyperlipidemia. Patients want to see how the results from today will come out and will proceed based on that. Patient is active, walking and exercising everyday. She said she has a green house and she keep working on that whole day. She watch her diet. She goes to dermatology every 6 months for her regular skin check up as she has an extensive skin cancer history.  For her HYPERTENSION:  She was last seen for hypertension 3 months ago.  BP at that visit was . Management includes .  She reports excellent compliance with treatment. She is having side effects. She is following a Low Sodium diet. She is exercising. She does smoke.  Use of agents associated with hypertension: none.   Duration of hyperlipidemia: chronic Cholesterol medication side effects: no Cholesterol supplements: Past cholesterol medications:  Medication compliance: good compliance Aspirin: no Recent stressors: no Recurrent headaches: no Visual changes: no Palpitations: no Dyspnea: no Chest pain: no Lower extremity edema: no Dizzy/lightheaded: no   Pertinent labs: Lab Results  Component Value Date   CHOL 246 (H) 09/12/2021   HDL 59 09/12/2021   LDLCALC 162 (H) 09/12/2021   TRIG 140 09/12/2021   CHOLHDL 4.2 09/12/2021   Lab Results  Component Value Date   NA 140 09/24/2021   K 5.7 (H) 09/24/2021   CREATININE 1.05 (H) 09/24/2021   EGFR 56 (L) 09/24/2021   GFRNONAA 60 06/26/2020   GLUCOSE 100 (H) 09/24/2021     The 10-year ASCVD risk score (Arnett DK, et al., 2019) is: 18.2%    Current  Outpatient Medications on File Prior to Visit  Medication Sig Dispense Refill   azelastine (ASTELIN) 0.1 % nasal spray Place 2 sprays into both nostrils 2 (two) times daily. Use in each nostril as directed 30 mL 12   cyanocobalamin (VITAMIN B12) 1000 MCG/ML injection Inject 1 mL (1,000 mcg total) into the muscle once a week. Once a week for four weeks, then monthly x 4 4 mL 0   hydrochlorothiazide (HYDRODIURIL) 25 MG tablet TAKE ONE TABLET BY MOUTH EVERY DAY 79 tablet 0   pramipexole (MIRAPEX) 0.25 MG tablet TAKE 1 TABLET BY MOUTH EVERY NIGHT AT BEDTIME 90 tablet 2   Vitamin D, Ergocalciferol, (DRISDOL) 1.25 MG (50000 UNIT) CAPS capsule Take 1 capsule (50,000 Units total) by mouth every 7 (seven) days. 5 capsule 2   No current facility-administered medications on file prior to visit.   Past Medical History:  Diagnosis Date   Arthritis    Cancer ([place])    pre cancerous skin cancer with multiple lesions removed   Complication of anesthesia    broken teeth   Essential (primary) hypertension    Generalized anxiety disorder    Hyperkalemia    Hypertension    Past Surgical History:  Procedure Laterality Date   SKIN CANCER EXCISION Bilateral    legs   TOTAL HIP ARTHROPLASTY Right    5 yrs ago   TOTAL KNEE ARTHROPLASTY Right 03/10/2016   Procedure: RIGHT TOTAL KNEE ARTHROPLASTY;  Surgeon: Richardson Landry  Ronnie Derby, MD;  Location: [facility];  Service: Orthopedics;  Laterality: Right;    Family History  Problem Relation Age of Onset   Stroke Father    Cerebrovascular Accident Other    Diabetes Mellitus II Other    Hyperlipidemia Other    Hypertension Other    Social History   Socioeconomic History   Marital status: Married    Spouse name: Not on file   Number of children: Not on file   Years of education: Not on file   Highest education level: Not on file  Occupational History   Not on file  Tobacco Use   Smoking status: Never   Smokeless tobacco: Never  Vaping Use   Vaping Use: Never used   Substance and Sexual Activity   Alcohol use: Yes    Comment: Drinks on a social basis.   Drug use: No   Sexual activity: Not on file  Other Topics Concern   Not on file  Social History Narrative   Not on file   Social Determinants of Health   Financial Resource Strain: Low Risk  (07/02/2020)   Overall Financial Resource Strain (CARDIA)    Difficulty of Paying Living Expenses: Not hard at all  Food Insecurity: No Food Insecurity (01/17/2022)   Hunger Vital Sign    Worried About Running Out of Food in the Last Year: Never true    Ran Out of Food in the Last Year: Never true  Transportation Needs: No Transportation Needs (07/02/2020)   PRAPARE - Hydrologist (Medical): No    Lack of Transportation (Non-Medical): No  Physical Activity: Sufficiently Active (07/02/2020)   Exercise Vital Sign    Days of Exercise per Week: 6 days    Minutes of Exercise per Session: 30 min  Stress: No Stress Concern Present (07/02/2020)   [place]    Feeling of Stress : Not at all  Social Connections: [place] (07/02/2020)   Social Connection and Isolation Panel [NHANES]    Frequency of Communication with Friends and Family: More than three times a week    Frequency of Social Gatherings with Friends and Family: More than three times a week    Attends Religious Services: More than 4 times per year    Active Member of [employer] or Organizations: Yes    Attends Music therapist: More than 4 times per year    Marital Status: Married    Review of Systems  Constitutional:  Negative for chills and fatigue.  HENT:  Negative for congestion, ear pain, sinus pain and sore throat.   Respiratory:  Negative for cough and shortness of breath.   Cardiovascular:  Negative for chest pain and leg swelling.  Gastrointestinal:  Negative for abdominal pain, constipation, diarrhea, nausea and vomiting.   Genitourinary:  Negative for dysuria and frequency.  Musculoskeletal:  Negative for arthralgias, back pain and myalgias.  Neurological:  Negative for dizziness and headaches.     Objective:  BP 130/80   Pulse 85   Temp (!) 97.2 F (36.2 C)   Resp 16   Ht '5\' 9"'$  (1.753 m)   Wt 185 lb (83.9 kg)   SpO2 95%   BMI 27.32 kg/m      08/26/2022   10:48 AM 08/26/2022    7:34 AM 01/28/2022    8:51 AM  BP/Weight  Systolic BP 341 937 902  Diastolic BP 80 82 80  Wt. (Lbs)  185 184.4  BMI  27.32 kg/m2 26.46 kg/m2    Physical Exam Vitals reviewed.  Constitutional:      Appearance: Normal appearance. She is normal weight.  Neck:     Vascular: No carotid bruit.  Cardiovascular:     Rate and Rhythm: Normal rate and regular rhythm.     Heart sounds: Normal heart sounds.  Pulmonary:     Effort: Pulmonary effort is normal.     Breath sounds: Normal breath sounds.  Abdominal:     General: Abdomen is flat. Bowel sounds are normal.     Palpations: Abdomen is soft.     Tenderness: There is no abdominal tenderness.  Musculoskeletal:     Cervical back: Normal range of motion.  Skin:    Findings: Rash present.     Comments: Skin rough, scaly and rashes present  Neurological:     Mental Status: She is alert and oriented to person, place, and time.  Psychiatric:        Mood and Affect: Mood normal.        Behavior: Behavior normal.    Lab Results  Component Value Date   WBC 6.9 09/12/2021   HGB 13.3 09/12/2021   HCT 38.1 09/12/2021   PLT 378 09/12/2021   GLUCOSE 100 (H) 09/24/2021   CHOL 246 (H) 09/12/2021   TRIG 140 09/12/2021   HDL 59 09/12/2021   LDLCALC 162 (H) 09/12/2021   ALT 21 09/24/2021   AST 27 09/24/2021   NA 140 09/24/2021   K 5.7 (H) 09/24/2021   CL 98 09/24/2021   CREATININE 1.05 (H) 09/24/2021   BUN 26 09/24/2021   CO2 23 09/24/2021   TSH 3.840 09/12/2021   INR 0.91 02/28/2016   HGBA1C 6.2 (H) 06/25/2020    Assessment & Plan:   Problem List Items  Addressed This Visit       Cardiovascular and Mediastinum   Essential hypertension, benign    Well controlled Continue Benazepril 20 mg and Hydrochlorothiazide 25 mg Will do lab work today  Nutrition: Stressed importance of moderation in sodium intake, saturated fat and cholesterol, caloric balance, sufficient intake of complex carbohydrates, fiber, calcium and iron.   Exercise: Stressed the importance of regular exercise.         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   Exercise: Stressed the importance of regular exercise.         Relevant Orders   Hemoglobin A1c   Mixed hyperlipidemia    Her past two LDL constantly high Said could not tolerated statins and Zetia If the level is still high today will recommend Repatha or Nexletol      Relevant Medications  benazepril (LOTENSIN) 20 MG tablet   furosemide (LASIX) 20 MG tablet   B12 deficiency    Patient is still doing Vitamin B12 injection more than a year, asked her to stop it. Will recheck Vitamin B12 today Will plan for OTC vitamin B12 if level is still high      Relevant Orders   Vitamin B12  Last Mammogram: Last Feb 2023 Colonoscopy: she does not want  colonoscopy this time. Every 6 month Dermatology for skin cancer  Follow-up:  will call for lipid lowering agents after the lab result from today Next follow up in 3 months for chronic check up and fasting. I, Neil Crouch have reviewed all documentation for this visit. The documentation on 08/26/22   for the exam, diagnosis, procedures, and orders are all accurate and complete.     An After Visit Summary was printed and given to the patient.  Neil Crouch, DNP, [place] [phone]

## 2022-08-26 NOTE — Progress Notes (Deleted)
Subjective:  Patient ID: Susan Roberson, female    DOB: [birth year]  Age: 72 y.o. MRN: [id number]  Chief Complaints:  History of Present illness:   Current Outpatient Medications on File Prior to Visit  Medication Sig Dispense Refill   azelastine (ASTELIN) 0.1 % nasal spray Place 2 sprays into both nostrils 2 (two) times daily. Use in each nostril as directed 30 mL 12   benazepril (LOTENSIN) 20 MG tablet Take 1 tablet (20 mg total) by mouth daily. 90 tablet 0   celecoxib (CELEBREX) 100 MG capsule Take 1 capsule (100 mg total) by mouth daily. 90 capsule 0   cyanocobalamin (VITAMIN B12) 1000 MCG/ML injection Inject 1 mL (1,000 mcg total) into the muscle once a week. Once a week for four weeks, then monthly x 4 4 mL 0   furosemide (LASIX) 20 MG tablet Take 1 tablet (20 mg total) by mouth daily. 30 tablet 3   hydrochlorothiazide (HYDRODIURIL) 25 MG tablet TAKE ONE TABLET BY MOUTH EVERY DAY 79 tablet 0   pramipexole (MIRAPEX) 0.25 MG tablet TAKE 1 TABLET BY MOUTH EVERY NIGHT AT BEDTIME 90 tablet 2   rosuvastatin (CRESTOR) 5 MG tablet Take 1 tablet (5 mg total) by mouth daily. 90 tablet 0   Vitamin D, Ergocalciferol, (DRISDOL) 1.25 MG (50000 UNIT) CAPS capsule Take 1 capsule (50,000 Units total) by mouth every 7 (seven) days. 5 capsule 2   No current facility-administered medications on file prior to visit.   Past Medical History:  Diagnosis Date   Arthritis    Cancer ([place])    pre cancerous skin cancer with multiple lesions removed   Complication of anesthesia    broken teeth   Essential (primary) hypertension    Generalized anxiety disorder    Hyperkalemia    Hypertension    Past Surgical History:  Procedure Laterality Date   SKIN CANCER EXCISION Bilateral    legs   TOTAL HIP ARTHROPLASTY Right    5 yrs ago   TOTAL KNEE ARTHROPLASTY Right 03/10/2016   Procedure: RIGHT TOTAL KNEE ARTHROPLASTY;  Surgeon: Vickey Huger, MD;  Location: [facility];  Service: Orthopedics;  Laterality: Right;     Family History  Problem Relation Age of Onset   Stroke Father    Cerebrovascular Accident Other    Diabetes Mellitus II Other    Hyperlipidemia Other    Hypertension Other    Social History   Socioeconomic History   Marital status: Married    Spouse name: Not on file   Number of children: Not on file   Years of education: Not on file   Highest education level: Not on file  Occupational History   Not on file  Tobacco Use   Smoking status: Never   Smokeless tobacco: Never  Vaping Use   Vaping Use: Never used  Substance and Sexual Activity   Alcohol use: Yes    Comment: Drinks on a social basis.   Drug use: No   Sexual activity: Not on file  Other Topics Concern   Not on file  Social History Narrative   Not on file   Social Determinants of Health   Financial Resource Strain: Low Risk  (07/02/2020)   Overall Financial Resource Strain (CARDIA)    Difficulty of Paying Living Expenses: Not hard at all  Food Insecurity: No Food Insecurity (01/17/2022)   Hunger Vital Sign    Worried About Running Out of Food in the Last Year: Never true    Ran Out of  Food in the Last Year: Never true  Transportation Needs: No Transportation Needs (07/02/2020)   PRAPARE - Hydrologist (Medical): No    Lack of Transportation (Non-Medical): No  Physical Activity: Sufficiently Active (07/02/2020)   Exercise Vital Sign    Days of Exercise per Week: 6 days    Minutes of Exercise per Session: 30 min  Stress: No Stress Concern Present (07/02/2020)   [place]    Feeling of Stress : Not at all  Social Connections: [place] (07/02/2020)   Social Connection and Isolation Panel [NHANES]    Frequency of Communication with Friends and Family: More than three times a week    Frequency of Social Gatherings with Friends and Family: More than three times a week    Attends Religious Services: More  than 4 times per year    Active Member of [employer] or Organizations: Yes    Attends Music therapist: More than 4 times per year    Marital Status: Married    Review of Systems   Objective:  There were no vitals taken for this visit.     01/28/2022    8:51 AM 09/12/2021    7:42 AM 02/14/2021    7:58 AM  BP/Weight  Systolic BP 924 268 341  Diastolic BP 80 76 78  Wt. (Lbs) 184.4 189 190.2  BMI 26.46 kg/m2 26.36 kg/m2 27.29 kg/m2    Physical Exam  Diabetic Foot Exam - Simple   No data filed      Lab Results  Component Value Date   WBC 6.9 09/12/2021   HGB 13.3 09/12/2021   HCT 38.1 09/12/2021   PLT 378 09/12/2021   GLUCOSE 100 (H) 09/24/2021   CHOL 246 (H) 09/12/2021   TRIG 140 09/12/2021   HDL 59 09/12/2021   LDLCALC 162 (H) 09/12/2021   ALT 21 09/24/2021   AST 27 09/24/2021   NA 140 09/24/2021   K 5.7 (H) 09/24/2021   CL 98 09/24/2021   CREATININE 1.05 (H) 09/24/2021   BUN 26 09/24/2021   CO2 23 09/24/2021   TSH 3.840 09/12/2021   INR 0.91 02/28/2016   HGBA1C 6.2 (H) 06/25/2020      Assessment & Plan:    Follow-up:   An After Visit Summary was printed and given to the patient.  Neil Crouch, DNP, [place] [phone]

## 2022-08-26 NOTE — Assessment & Plan Note (Signed)
Her past two LDL constantly high Said could not tolerated statins and Zetia If the level is still high today will recommend Repatha or Nexletol

## 2022-08-26 NOTE — Patient Instructions (Addendum)
Next follow up in 3 months  Will call for another lipid lowering agents if Lipid level still high from today's lab  DASH Eating Plan DASH stands for Dietary Approaches to Stop Hypertension. The DASH eating plan is a healthy eating plan that has been shown to: Reduce high blood pressure (hypertension). Reduce your risk for type 2 diabetes, heart disease, and stroke. Help with weight loss. What are tips for following this plan? Reading food labels Check food labels for the amount of salt (sodium) per serving. Choose foods with less than 5 percent of the Daily Value of sodium. Generally, foods with less than 300 milligrams (mg) of sodium per serving fit into this eating plan. To find whole grains, look for the word "whole" as the first word in the ingredient list. Shopping Buy products labeled as "low-sodium" or "no salt added." Buy fresh foods. Avoid canned foods and pre-made or frozen meals. Cooking Avoid adding salt when cooking. Use salt-free seasonings or herbs instead of table salt or sea salt. Check with your health care provider or pharmacist before using salt substitutes. Do not fry foods. Cook foods using healthy methods such as baking, boiling, grilling, roasting, and broiling instead. Cook with heart-healthy oils, such as olive, canola, avocado, soybean, or sunflower oil. Meal planning  Eat a balanced diet that includes: 4 or more servings of fruits and 4 or more servings of vegetables each day. Try to fill one-half of your plate with fruits and vegetables. 6-8 servings of whole grains each day. Less than 6 oz (170 g) of lean meat, poultry, or fish each day. A 3-oz (85-g) serving of meat is about the same size as a deck of cards. One egg equals 1 oz (28 g). 2-3 servings of low-fat dairy each day. One serving is 1 cup (237 mL). 1 serving of nuts, seeds, or beans 5 times each week. 2-3 servings of heart-healthy fats. Healthy fats called omega-3 fatty acids are found in foods such  as walnuts, flaxseeds, fortified milks, and eggs. These fats are also found in cold-water fish, such as sardines, salmon, and mackerel. Limit how much you eat of: Canned or prepackaged foods. Food that is high in trans fat, such as some fried foods. Food that is high in saturated fat, such as fatty meat. Desserts and other sweets, sugary drinks, and other foods with added sugar. Full-fat dairy products. Do not salt foods before eating. Do not eat more than 4 egg yolks a week. Try to eat at least 2 vegetarian meals a week. Eat more home-cooked food and less restaurant, buffet, and fast food. Lifestyle When eating at a restaurant, ask that your food be prepared with less salt or no salt, if possible. If you drink alcohol: Limit how much you use to: 0-1 drink a day for women who are not pregnant. 0-2 drinks a day for men. Be aware of how much alcohol is in your drink. In the [place] one drink equals one 12 oz bottle of beer (355 mL), one 5 oz glass of wine (148 mL), or one 1 oz glass of hard liquor (44 mL). General information Avoid eating more than 2,300 mg of salt a day. If you have hypertension, you may need to reduce your sodium intake to 1,500 mg a day. Work with your health care provider to maintain a healthy body weight or to lose weight. Ask what an ideal weight is for you. Get at least 30 minutes of exercise that causes your heart to beat faster (  aerobic exercise) most days of the week. Activities may include walking, swimming, or biking. Work with your health care provider or dietitian to adjust your eating plan to your individual calorie needs. What foods should I eat? Fruits All fresh, dried, or frozen fruit. Canned fruit in natural juice (without added sugar). Vegetables Fresh or frozen vegetables (raw, steamed, roasted, or grilled). Low-sodium or reduced-sodium tomato and vegetable juice. Low-sodium or reduced-sodium tomato sauce and tomato paste. Low-sodium or reduced-sodium  canned vegetables. Grains Whole-grain or whole-wheat bread. Whole-grain or whole-wheat pasta. Brown rice. [place]. Bulgur. Whole-grain and low-sodium cereals. Pita bread. Low-fat, low-sodium crackers. Whole-wheat flour tortillas. Meats and other proteins Skinless chicken or [place]. Ground chicken or [place]. Pork with fat trimmed off. Fish and seafood. Egg whites. Dried beans, peas, or lentils. Unsalted nuts, nut butters, and seeds. Unsalted canned beans. Lean cuts of beef with fat trimmed off. Low-sodium, lean precooked or cured meat, such as sausages or meat loaves. Dairy Low-fat (1%) or fat-free (skim) milk. Reduced-fat, low-fat, or fat-free cheeses. Nonfat, low-sodium ricotta or cottage cheese. Low-fat or nonfat yogurt. Low-fat, low-sodium cheese. Fats and oils Soft margarine without trans fats. Vegetable oil. Reduced-fat, low-fat, or light mayonnaise and salad dressings (reduced-sodium). Canola, safflower, olive, avocado, soybean, and sunflower oils. Avocado. Seasonings and condiments Herbs. Spices. Seasoning mixes without salt. Other foods Unsalted popcorn and pretzels. Fat-free sweets. The items listed above may not be a complete list of foods and beverages you can eat. Contact a dietitian for more information. What foods should I avoid? Fruits Canned fruit in a light or heavy syrup. Fried fruit. Fruit in cream or butter sauce. Vegetables Creamed or fried vegetables. Vegetables in a cheese sauce. Regular canned vegetables (not low-sodium or reduced-sodium). Regular canned tomato sauce and paste (not low-sodium or reduced-sodium). Regular tomato and vegetable juice (not low-sodium or reduced-sodium). Angie Fava. Olives. Grains Baked goods made with fat, such as croissants, muffins, or some breads. Dry pasta or rice meal packs. Meats and other proteins Fatty cuts of meat. Ribs. Fried meat. Berniece Salines. Bologna, salami, and other precooked or cured meats, such as sausages or meat loaves. Fat  from the back of a pig (fatback). Bratwurst. Salted nuts and seeds. Canned beans with added salt. Canned or smoked fish. Whole eggs or egg yolks. Chicken or [place] with skin. Dairy Whole or 2% milk, cream, and half-and-half. Whole or full-fat cream cheese. Whole-fat or sweetened yogurt. Full-fat cheese. Nondairy creamers. Whipped toppings. Processed cheese and cheese spreads. Fats and oils Butter. Stick margarine. Lard. Shortening. Ghee. Bacon fat. Tropical oils, such as coconut, palm kernel, or palm oil. Seasonings and condiments Onion salt, garlic salt, seasoned salt, table salt, and sea salt. Worcestershire sauce. Tartar sauce. Barbecue sauce. Teriyaki sauce. Soy sauce, including reduced-sodium. Steak sauce. Canned and packaged gravies. Fish sauce. Oyster sauce. Cocktail sauce. Store-bought horseradish. Ketchup. Mustard. Meat flavorings and tenderizers. Bouillon cubes. Hot sauces. Pre-made or packaged marinades. Pre-made or packaged taco seasonings. Relishes. Regular salad dressings. Other foods Salted popcorn and pretzels. The items listed above may not be a complete list of foods and beverages you should avoid. Contact a dietitian for more information. Where to find more information National Heart, Lung, and Blood Institute: https://wilson-eaton.com/ American Heart Association: www.heart.org Academy of Nutrition and Dietetics: www.eatright.[place]: www.kidney.org Summary The DASH eating plan is a healthy eating plan that has been shown to reduce high blood pressure (hypertension). It may also reduce your risk for type 2 diabetes, heart disease, and stroke. When on the  DASH eating plan, aim to eat more fresh fruits and vegetables, whole grains, lean proteins, low-fat dairy, and heart-healthy fats. With the DASH eating plan, you should limit salt (sodium) intake to 2,300 mg a day. If you have hypertension, you may need to reduce your sodium intake to 1,500 mg a day. Work with  your health care provider or dietitian to adjust your eating plan to your individual calorie needs. This information is not intended to replace advice given to you by your health care provider. Make sure you discuss any questions you have with your health care provider. Document Revised: 07/08/2019 Document Reviewed: 07/08/2019 Elsevier Patient Education  [place].

## 2022-08-26 NOTE — Assessment & Plan Note (Signed)
Well controlled, no swelling this time Taking Lasix 20 mg daily, asked patient to take it as needed not everyday Will check CMP level today

## 2022-08-26 NOTE — Assessment & Plan Note (Addendum)
Patient is still doing Vitamin B12 injection more than a year, asked her to stop it. Will recheck Vitamin B12 today Will plan for OTC vitamin B12 if level is still high

## 2022-08-27 ENCOUNTER — Other Ambulatory Visit: Payer: Self-pay | Admitting: Nurse Practitioner

## 2022-08-27 DIAGNOSIS — E782 Mixed hyperlipidemia: Secondary | ICD-10-CM

## 2022-08-27 LAB — LIPID PANEL
Chol/HDL Ratio: 4.4 ratio (ref 0.0–4.4)
Cholesterol, Total: 222 mg/dL — ABNORMAL HIGH (ref 100–199)
HDL: 51 mg/dL (ref >39)
LDL Chol Calc (NIH): 150 mg/dL — ABNORMAL HIGH (ref 0–99)
Triglycerides: 117 mg/dL (ref 0–149)
VLDL Cholesterol Cal: 21 mg/dL (ref 5–40)

## 2022-08-27 LAB — CBC WITH DIFFERENTIAL/PLATELET
Basophils Absolute: 0.1 10*3/uL (ref 0.0–0.2)
Basos: 1 %
EOS (ABSOLUTE): 0.3 10*3/uL (ref 0.0–0.4)
Eos: 4 %
Hematocrit: 41.1 % (ref 34.0–46.6)
Hemoglobin: 13.9 g/dL (ref 11.1–15.9)
Immature Grans (Abs): 0 10*3/uL (ref 0.0–0.1)
Immature Granulocytes: 0 %
Lymphocytes Absolute: 2.3 10*3/uL (ref 0.7–3.1)
Lymphs: 30 %
MCH: 28.8 pg (ref 26.6–33.0)
MCHC: 33.8 g/dL (ref 31.5–35.7)
MCV: 85 fL (ref 79–97)
Monocytes Absolute: 0.6 10*3/uL (ref 0.1–0.9)
Monocytes: 9 %
Neutrophils Absolute: 4.3 10*3/uL (ref 1.4–7.0)
Neutrophils: 56 %
Platelets: 419 10*3/uL (ref 150–450)
RBC: 4.83 x10E6/uL (ref 3.77–5.28)
RDW: 11.8 % (ref 11.7–15.4)
WBC: 7.6 10*3/uL (ref 3.4–10.8)

## 2022-08-27 LAB — COMPREHENSIVE METABOLIC PANEL
ALT: 18 IU/L (ref 0–32)
AST: 23 IU/L (ref 0–40)
Albumin/Globulin Ratio: 1.6 (ref 1.2–2.2)
Albumin: 4.5 g/dL (ref 3.8–4.8)
Alkaline Phosphatase: 112 IU/L (ref 44–121)
BUN/Creatinine Ratio: 22 (ref 12–28)
BUN: 23 mg/dL (ref 8–27)
Bilirubin Total: 0.2 mg/dL (ref 0.0–1.2)
CO2: 24 mmol/L (ref 20–29)
Calcium: 10 mg/dL (ref 8.7–10.3)
Chloride: 97 mmol/L (ref 96–106)
Creatinine, Ser: 1.04 mg/dL — ABNORMAL HIGH (ref 0.57–1.00)
Globulin, Total: 2.8 g/dL (ref 1.5–4.5)
Glucose: 98 mg/dL (ref 70–99)
Potassium: 5.4 mmol/L — ABNORMAL HIGH (ref 3.5–5.2)
Sodium: 138 mmol/L (ref 134–144)
Total Protein: 7.3 g/dL (ref 6.0–8.5)
eGFR: 57 mL/min/{1.73_m2} — ABNORMAL LOW (ref >59)

## 2022-08-27 LAB — CARDIOVASCULAR RISK ASSESSMENT

## 2022-08-27 LAB — TSH: TSH: 4.79 u[IU]/mL — ABNORMAL HIGH (ref 0.450–4.500)

## 2022-08-27 LAB — HEMOGLOBIN A1C
Est. average glucose Bld gHb Est-mCnc: 131 mg/dL
Hgb A1c MFr Bld: 6.2 % — ABNORMAL HIGH (ref 4.8–5.6)

## 2022-08-27 LAB — VITAMIN B12: Vitamin B-12: 485 pg/mL (ref 232–1245)

## 2022-08-27 MED ORDER — NEXLETOL 180 MG PO TABS: 1.0000 | ORAL_TABLET | Freq: Every day | ORAL | 0 refills | Status: DC

## 2022-09-09 ENCOUNTER — Other Ambulatory Visit: Payer: Self-pay | Admitting: Nurse Practitioner

## 2022-09-09 DIAGNOSIS — Z6826 Body mass index (BMI) 26.0-26.9, adult: Secondary | ICD-10-CM

## 2022-09-09 DIAGNOSIS — E538 Deficiency of other specified B group vitamins: Secondary | ICD-10-CM

## 2022-09-09 DIAGNOSIS — R252 Cramp and spasm: Secondary | ICD-10-CM

## 2022-09-10 ENCOUNTER — Other Ambulatory Visit: Payer: Self-pay | Admitting: Nurse Practitioner

## 2022-09-11 ENCOUNTER — Other Ambulatory Visit: Payer: Self-pay | Admitting: Nurse Practitioner

## 2022-09-11 DIAGNOSIS — I10 Essential (primary) hypertension: Secondary | ICD-10-CM

## 2022-09-11 MED ORDER — HYDROCHLOROTHIAZIDE 25 MG PO TABS: 25.0000 mg | ORAL_TABLET | Freq: Every day | ORAL | 0 refills | Status: DC

## 2022-10-10 ENCOUNTER — Other Ambulatory Visit: Payer: Self-pay

## 2022-10-10 DIAGNOSIS — M1991 Primary osteoarthritis, unspecified site: Secondary | ICD-10-CM

## 2022-10-12 MED ORDER — VITAMIN D (ERGOCALCIFEROL) 1.25 MG (50000 UNIT) PO CAPS: 50000.0000 [IU] | ORAL_CAPSULE | ORAL | 2 refills | Status: DC

## 2022-10-12 MED ORDER — CELECOXIB 100 MG PO CAPS: 100.0000 mg | ORAL_CAPSULE | Freq: Every day | ORAL | 0 refills | Status: DC

## 2022-10-13 ENCOUNTER — Other Ambulatory Visit: Payer: Self-pay

## 2022-10-13 DIAGNOSIS — I10 Essential (primary) hypertension: Secondary | ICD-10-CM

## 2022-11-11 ENCOUNTER — Other Ambulatory Visit: Payer: Self-pay

## 2022-11-11 DIAGNOSIS — I10 Essential (primary) hypertension: Secondary | ICD-10-CM

## 2022-11-11 MED ORDER — HYDROCHLOROTHIAZIDE 25 MG PO TABS: 25.0000 mg | ORAL_TABLET | Freq: Every day | ORAL | 0 refills | Status: DC

## 2022-11-25 ENCOUNTER — Other Ambulatory Visit: Payer: Self-pay

## 2022-11-25 DIAGNOSIS — E538 Deficiency of other specified B group vitamins: Secondary | ICD-10-CM

## 2022-11-25 DIAGNOSIS — R252 Cramp and spasm: Secondary | ICD-10-CM

## 2022-11-25 DIAGNOSIS — Z6826 Body mass index (BMI) 26.0-26.9, adult: Secondary | ICD-10-CM

## 2022-11-25 MED ORDER — VITAMIN B-12 1000 MCG PO TABS: 1000.0000 ug | ORAL_TABLET | Freq: Every day | ORAL | 0 refills | Status: DC

## 2022-11-26 ENCOUNTER — Ambulatory Visit: Payer: Medicare Other | Admitting: Nurse Practitioner

## 2022-12-08 ENCOUNTER — Other Ambulatory Visit: Payer: Self-pay

## 2022-12-08 DIAGNOSIS — E538 Deficiency of other specified B group vitamins: Secondary | ICD-10-CM

## 2022-12-08 DIAGNOSIS — Z6826 Body mass index (BMI) 26.0-26.9, adult: Secondary | ICD-10-CM

## 2022-12-08 DIAGNOSIS — I10 Essential (primary) hypertension: Secondary | ICD-10-CM

## 2022-12-08 DIAGNOSIS — R252 Cramp and spasm: Secondary | ICD-10-CM

## 2022-12-08 MED ORDER — HYDROCHLOROTHIAZIDE 25 MG PO TABS: 25.0000 mg | ORAL_TABLET | Freq: Every day | ORAL | 0 refills | Status: DC

## 2022-12-08 MED ORDER — VITAMIN B-12 1000 MCG PO TABS: 1000.0000 ug | ORAL_TABLET | Freq: Every day | ORAL | 0 refills | Status: DC

## 2022-12-17 NOTE — Progress Notes (Deleted)
ERROR

## 2022-12-18 ENCOUNTER — Ambulatory Visit (INDEPENDENT_AMBULATORY_CARE_PROVIDER_SITE_OTHER): Payer: Medicare Other | Admitting: Family Medicine

## 2022-12-18 ENCOUNTER — Encounter: Payer: Self-pay | Admitting: Physician Assistant

## 2022-12-18 VITALS — BP 164/96 | HR 77 | Temp 97.8°F | Resp 14 | Ht 69.0 in | Wt 183.0 lb

## 2022-12-18 DIAGNOSIS — Z0000 Encounter for general adult medical examination without abnormal findings: Secondary | ICD-10-CM

## 2022-12-18 DIAGNOSIS — E538 Deficiency of other specified B group vitamins: Secondary | ICD-10-CM

## 2022-12-18 DIAGNOSIS — R7303 Prediabetes: Secondary | ICD-10-CM | POA: Diagnosis not present

## 2022-12-18 DIAGNOSIS — E782 Mixed hyperlipidemia: Secondary | ICD-10-CM | POA: Diagnosis not present

## 2022-12-18 DIAGNOSIS — I10 Essential (primary) hypertension: Secondary | ICD-10-CM

## 2022-12-18 MED ORDER — BENAZEPRIL HCL 40 MG PO TABS: 40.0000 mg | ORAL_TABLET | Freq: Every day | ORAL | 3 refills | Status: DC

## 2022-12-18 NOTE — Assessment & Plan Note (Signed)
Patient is presenting today with continued high blood pressure. We adjusted her Benazepril 20mg  to 40mg . We also discontinued the Lasix 20mg  to assist with her frequent urination.

## 2022-12-18 NOTE — Assessment & Plan Note (Addendum)
Checking her A1C and glucose today to see if there is an increase. If so we will look at adding Metformin 500mg  daily  Patient denies any issues with compliance.  Labs drawn today

## 2022-12-18 NOTE — Progress Notes (Signed)
Subjective:  Patient ID: Susan Roberson, female    DOB: 10-18-1950  Age: 72 y.o. MRN: [id number]  Chief Complaint  Patient presents with   Hypertension    HPI    Susan Roberson is a 72 year old Caucasian female that presents for follow-up of hypertension and hyperlipidemia. Patient had some questions and concerns about her medications. She admited to having to urinate very frequently and that it had become bothersome for her to have to find a bathroom so often. She denied having continued issues retaining fluid or ever having an increase in edema. Her BP was also still high, and she stated she is unable to check it frequently at home. She admits to an increase in dizzy spells and headaches, but that they don't last long or are super frequent. She denies any vision changes, nausea, or vomiting.   Hypertension, follow-up:  She was last seen for hypertension 3 months ago.  BP at that visit was 170/100 Management since that visit includes benazepril 20 mg and HCTZ 25 mg.  She reports excellent compliance with treatment. She is not having side effects.  She is following a Regular diet. She is not exercising. She does not smoke.  Use of agents associated with hypertension:HCTZ and Benazepril.  The 10-year ASCVD risk score (Arnett DK, et al., 2019) is: 17.7%    Lipid/Cholesterol, Follow-up   She was last seen for this 3 months ago.  Management since that visit includes none medicine  She reports excellent compliance with treatment. She is not having side effects.      01/17/2022   10:06 AM 09/12/2021    7:43 AM 07/02/2020    9:19 AM 06/25/2020   10:32 AM  Depression screen PHQ 2/9  Decreased Interest 0 0 0 0  Down, Depressed, Hopeless 0 0 0 0  PHQ - 2 Score 0 0 0 0  Altered sleeping   0   Tired, decreased energy   0   Change in appetite   0   Feeling bad or failure about yourself    0   Trouble concentrating   0   Moving slowly or fidgety/restless   0   Suicidal thoughts   0   PHQ-9  Score   0         01/17/2022   10:12 AM  Fall Risk   Falls in the past year? 0  Number falls in past yr: 0  Injury with Fall? 0  Risk for fall due to : No Fall Risks  Follow up Falls evaluation completed;Education provided    Patient Care Team: Langley Gauss, PA as PCP - General (Physician Assistant)   Review of Systems  Constitutional:  Negative for chills, fatigue and fever.  HENT:  Negative for congestion, ear pain and sore throat.   Respiratory:  Negative for cough and shortness of breath.   Cardiovascular:  Negative for chest pain and palpitations.  Gastrointestinal:  Negative for abdominal pain, constipation, diarrhea, nausea and vomiting.  Endocrine: Negative for polydipsia, polyphagia and polyuria.  Genitourinary:  Negative for difficulty urinating and dysuria.  Musculoskeletal:  Negative for arthralgias, back pain and myalgias.  Skin:  Negative for rash.  Neurological:  Negative for dizziness and headaches.  Psychiatric/Behavioral:  Negative for dysphoric mood. The patient is not nervous/anxious.     Current Outpatient Medications on File Prior to Visit  Medication Sig Dispense Refill   celecoxib (CELEBREX) 100 MG capsule Take 1 capsule (100 mg total) by mouth daily. 90 capsule 0  cyanocobalamin (VITAMIN B12) 1000 MCG tablet Take 1 tablet (1,000 mcg total) by mouth daily. 90 tablet 0   furosemide (LASIX) 20 MG tablet Take 1 tablet (20 mg total) by mouth daily. 30 tablet 1   hydrochlorothiazide (HYDRODIURIL) 25 MG tablet Take 1 tablet (25 mg total) by mouth daily. 90 tablet 0   pramipexole (MIRAPEX) 0.25 MG tablet TAKE 1 TABLET BY MOUTH EVERY NIGHT AT BEDTIME 90 tablet 2   No current facility-administered medications on file prior to visit.   Past Medical History:  Diagnosis Date   Arthritis    Cancer (HCC)    pre cancerous skin cancer with multiple lesions removed   Complication of anesthesia    broken teeth   Essential (primary) hypertension    Generalized  anxiety disorder    Hyperkalemia    Hypertension    Past Surgical History:  Procedure Laterality Date   SKIN CANCER EXCISION Bilateral    legs   TOTAL HIP ARTHROPLASTY Right    5 yrs ago   TOTAL Roberson ARTHROPLASTY Right 03/10/2016   Procedure: RIGHT TOTAL Roberson ARTHROPLASTY;  Surgeon: Dannielle Huh, MD;  Location: [facility] OR;  Service: Orthopedics;  Laterality: Right;    Family History  Problem Relation Age of Onset   Stroke Father    Cerebrovascular Accident Other    Diabetes Mellitus II Other    Hyperlipidemia Other    Hypertension Other    Social History   Socioeconomic History   Marital status: Married    Spouse name: Not on file   Number of children: Not on file   Years of education: Not on file   Highest education level: Not on file  Occupational History   Not on file  Tobacco Use   Smoking status: Never   Smokeless tobacco: Never  Vaping Use   Vaping Use: Never used  Substance and Sexual Activity   Alcohol use: Yes    Comment: Drinks on a social basis.   Drug use: No   Sexual activity: Not on file  Other Topics Concern   Not on file  Social History Narrative   Not on file   Social Determinants of Health   Financial Resource Strain: Low Risk  (07/02/2020)   Overall Financial Resource Strain (CARDIA)    Difficulty of Paying Living Expenses: Not hard at all  Food Insecurity: No Food Insecurity (01/17/2022)   Hunger Vital Sign    Worried About Running Out of Food in the Last Year: Never true    Ran Out of Food in the Last Year: Never true  Transportation Needs: No Transportation Needs (07/02/2020)   PRAPARE - Administrator, Civil Service (Medical): No    Lack of Transportation (Non-Medical): No  Physical Activity: Sufficiently Active (07/02/2020)   Exercise Vital Sign    Days of Exercise per Week: 6 days    Minutes of Exercise per Session: 30 min  Stress: No Stress Concern Present (07/02/2020)   [employer] of Occupational Health - Occupational  Stress Questionnaire    Feeling of Stress : Not at all  Social Connections: Socially Integrated (07/02/2020)   Social Connection and Isolation Panel [NHANES]    Frequency of Communication with Friends and Family: More than three times a week    Frequency of Social Gatherings with Friends and Family: More than three times a week    Attends Religious Services: More than 4 times per year    Active Member of [employer] or Organizations: Yes  Attends Banker Meetings: More than 4 times per year    Marital Status: Married    Objective:  BP (!) 164/96   Pulse 77   Temp 97.8 F (36.6 C)   Resp 14   Ht 5\' 9"  (1.753 m)   Wt 183 lb (83 kg)   SpO2 96%   BMI 27.02 kg/m      12/18/2022    9:18 AM 12/18/2022    8:39 AM 08/26/2022   10:48 AM  BP/Weight  Systolic BP 164 170 130  Diastolic BP 96 100 80  Wt. (Lbs)  183   BMI  27.02 kg/m2     Physical Exam Constitutional:      Appearance: Normal appearance. She is obese.  Neck:     Vascular: No carotid bruit.  Cardiovascular:     Rate and Rhythm: Normal rate and regular rhythm.     Heart sounds: Normal heart sounds.  Pulmonary:     Effort: Pulmonary effort is normal.     Breath sounds: Normal breath sounds.  Abdominal:     General: Bowel sounds are normal.     Palpations: Abdomen is soft.     Tenderness: There is no abdominal tenderness.  Neurological:     Mental Status: She is alert and oriented to person, place, and time.  Psychiatric:        Mood and Affect: Mood normal.        Behavior: Behavior normal.    Diabetic Foot Exam - Simple   No data filed      Lab Results  Component Value Date   WBC 7.0 12/18/2022   HGB 13.9 12/18/2022   HCT 42.1 12/18/2022   PLT 369 12/18/2022   GLUCOSE 98 12/18/2022   CHOL 290 (H) 12/18/2022   TRIG 204 (H) 12/18/2022   HDL 62 12/18/2022   LDLCALC 190 (H) 12/18/2022   ALT 18 12/18/2022   AST 23 12/18/2022   NA 140 12/18/2022   K 5.4 (H) 12/18/2022   CL 100 12/18/2022    CREATININE 0.96 12/18/2022   BUN 18 12/18/2022   CO2 21 12/18/2022   TSH 2.530 12/18/2022   INR 0.91 02/28/2016   HGBA1C 6.0 (H) 12/18/2022      Assessment & Plan:    Mixed hyperlipidemia Assessment & Plan: We are doing a lipid panel today. We will see where her labs are today. If they are elevated we will look at prescribing her something at her follow up. She stated she has tried multiple but was unable to recall what they were.   Orders: -     Lipid panel  Essential hypertension, benign Assessment & Plan: Patient is presenting today with continued high blood pressure. We adjusted her Benazepril 20mg  to 40mg . We also discontinued the Lasix 20mg  to assist with her frequent urination.   Orders: -     Comprehensive metabolic panel -     CBC with Differential/Platelet -     Benazepril HCl; Take 1 tablet (40 mg total) by mouth daily.  Dispense: 90 tablet; Refill: 3 -     TSH -     Cardiovascular Risk Assessment  B12 deficiency Assessment & Plan: The current medical regimen is effective;  continue present plan and medications.  Continue oral b12.  Check level today.   Orders: -     Vitamin B12  Prediabetes Assessment & Plan: Checking her A1C and glucose today to see if there is an increase. If so we will  look at adding Metformin 500mg  daily  Patient denies any issues with compliance.  Labs drawn today  Orders: -     Hemoglobin A1c     Meds ordered this encounter  Medications   benazepril (LOTENSIN) 40 MG tablet    Sig: Take 1 tablet (40 mg total) by mouth daily.    Dispense:  90 tablet    Refill:  3    Orders Placed This Encounter  Procedures   Comprehensive metabolic panel   Lipid panel   CBC with Differential/Platelet   Hemoglobin A1c   Vitamin B12   TSH   Cardiovascular Risk Assessment     Follow-up: Return in 4 weeks (on 01/15/2023) for [place].   I,Marla I Leal-Borjas,acting as a scribe for Blane Ohara, MD.,have documented all relevant  documentation on the behalf of Blane Ohara, MD,as directed by  Blane Ohara, MD while in the presence of Blane Ohara, MD.   An After Visit Summary was printed and given to the patient.  Patient was seen/evaluated by Langley Gauss PAC and myself. Agree with plan.   Blane Ohara, MD

## 2022-12-18 NOTE — Assessment & Plan Note (Signed)
We are doing a lipid panel today. We will see where her labs are today. If they are elevated we will look at prescribing her something at her follow up. She stated she has tried multiple but was unable to recall what they were.

## 2022-12-19 LAB — COMPREHENSIVE METABOLIC PANEL
ALT: 18 IU/L (ref 0–32)
AST: 23 IU/L (ref 0–40)
Albumin/Globulin Ratio: 1.6 (ref 1.2–2.2)
Albumin: 4.6 g/dL (ref 3.8–4.8)
Alkaline Phosphatase: 112 IU/L (ref 44–121)
BUN/Creatinine Ratio: 19 (ref 12–28)
BUN: 18 mg/dL (ref 8–27)
Bilirubin Total: 0.3 mg/dL (ref 0.0–1.2)
CO2: 21 mmol/L (ref 20–29)
Calcium: 10.1 mg/dL (ref 8.7–10.3)
Chloride: 100 mmol/L (ref 96–106)
Creatinine, Ser: 0.96 mg/dL (ref 0.57–1.00)
Globulin, Total: 2.9 g/dL (ref 1.5–4.5)
Glucose: 98 mg/dL (ref 70–99)
Potassium: 5.4 mmol/L — ABNORMAL HIGH (ref 3.5–5.2)
Sodium: 140 mmol/L (ref 134–144)
Total Protein: 7.5 g/dL (ref 6.0–8.5)
eGFR: 62 mL/min/{1.73_m2} (ref >59)

## 2022-12-19 LAB — CBC WITH DIFFERENTIAL/PLATELET
Basophils Absolute: 0.1 10*3/uL (ref 0.0–0.2)
Basos: 1 %
EOS (ABSOLUTE): 0.3 10*3/uL (ref 0.0–0.4)
Eos: 4 %
Hematocrit: 42.1 % (ref 34.0–46.6)
Hemoglobin: 13.9 g/dL (ref 11.1–15.9)
Immature Grans (Abs): 0 10*3/uL (ref 0.0–0.1)
Immature Granulocytes: 0 %
Lymphocytes Absolute: 1.8 10*3/uL (ref 0.7–3.1)
Lymphs: 25 %
MCH: 29.1 pg (ref 26.6–33.0)
MCHC: 33 g/dL (ref 31.5–35.7)
MCV: 88 fL (ref 79–97)
Monocytes Absolute: 0.6 10*3/uL (ref 0.1–0.9)
Monocytes: 8 %
Neutrophils Absolute: 4.3 10*3/uL (ref 1.4–7.0)
Neutrophils: 62 %
Platelets: 369 10*3/uL (ref 150–450)
RBC: 4.78 x10E6/uL (ref 3.77–5.28)
RDW: 13.2 % (ref 11.7–15.4)
WBC: 7 10*3/uL (ref 3.4–10.8)

## 2022-12-19 LAB — VITAMIN B12: Vitamin B-12: 1869 pg/mL — ABNORMAL HIGH (ref 232–1245)

## 2022-12-19 LAB — LIPID PANEL
Chol/HDL Ratio: 4.7 ratio — ABNORMAL HIGH (ref 0.0–4.4)
Cholesterol, Total: 290 mg/dL — ABNORMAL HIGH (ref 100–199)
HDL: 62 mg/dL (ref >39)
LDL Chol Calc (NIH): 190 mg/dL — ABNORMAL HIGH (ref 0–99)
Triglycerides: 204 mg/dL — ABNORMAL HIGH (ref 0–149)
VLDL Cholesterol Cal: 38 mg/dL (ref 5–40)

## 2022-12-19 LAB — HEMOGLOBIN A1C
Est. average glucose Bld gHb Est-mCnc: 126 mg/dL
Hgb A1c MFr Bld: 6 % — ABNORMAL HIGH (ref 4.8–5.6)

## 2022-12-19 LAB — CARDIOVASCULAR RISK ASSESSMENT

## 2022-12-19 LAB — TSH: TSH: 2.53 u[IU]/mL (ref 0.450–4.500)

## 2022-12-19 NOTE — Progress Notes (Signed)
Your b12 levels and potassium is elevated. I would like you to take your B12 every other day to see if we can help both of those numbers go down.   I also want to start you on a statin to help with your cholesterol since it has gone up in multiple areas. I know you said you had tried some before and they didn't work well for you. Was one of them Atorvastatin? I would like to start you on Atorvastatin 40mg  and see if that can help with your cholesterol over the next 3 months.  Your A1C is staying low so please continue to monitor your diet and exercise

## 2022-12-22 NOTE — Assessment & Plan Note (Addendum)
The current medical regimen is effective;  continue present plan and medications.  Continue oral b12.  Check level today.

## 2022-12-23 ENCOUNTER — Other Ambulatory Visit: Payer: Self-pay

## 2022-12-23 DIAGNOSIS — M1991 Primary osteoarthritis, unspecified site: Secondary | ICD-10-CM

## 2022-12-23 MED ORDER — VITAMIN D (ERGOCALCIFEROL) 1.25 MG (50000 UNIT) PO CAPS
50000.0000 [IU] | ORAL_CAPSULE | ORAL | 2 refills | Status: DC
Start: 2022-12-23 — End: 2023-06-08

## 2023-01-05 ENCOUNTER — Other Ambulatory Visit: Payer: Self-pay

## 2023-01-05 DIAGNOSIS — E782 Mixed hyperlipidemia: Secondary | ICD-10-CM

## 2023-01-05 MED ORDER — ATORVASTATIN CALCIUM 40 MG PO TABS
40.0000 mg | ORAL_TABLET | Freq: Every day | ORAL | 0 refills | Status: DC
Start: 2023-01-05 — End: 2023-04-21

## 2023-01-05 NOTE — Progress Notes (Signed)
Patient reviewed her labs via my chart and was informed of her lab results and patient was willing to start statin per recommendations and wanted the rx sent to [facility]. Sent in new rx for Atorvastatin 40 mg 1 tablet daily.

## 2023-01-07 ENCOUNTER — Other Ambulatory Visit: Payer: Self-pay | Admitting: Family Medicine

## 2023-01-07 DIAGNOSIS — Z6826 Body mass index (BMI) 26.0-26.9, adult: Secondary | ICD-10-CM

## 2023-01-07 DIAGNOSIS — E538 Deficiency of other specified B group vitamins: Secondary | ICD-10-CM

## 2023-01-07 DIAGNOSIS — R252 Cramp and spasm: Secondary | ICD-10-CM

## 2023-01-16 ENCOUNTER — Encounter: Payer: Self-pay | Admitting: Physician Assistant

## 2023-01-16 ENCOUNTER — Ambulatory Visit (INDEPENDENT_AMBULATORY_CARE_PROVIDER_SITE_OTHER): Payer: Medicare Other | Admitting: Physician Assistant

## 2023-01-16 VITALS — BP 130/74 | HR 69 | Temp 96.7°F | Ht 70.0 in | Wt 186.0 lb

## 2023-01-16 DIAGNOSIS — G2581 Restless legs syndrome: Secondary | ICD-10-CM

## 2023-01-16 DIAGNOSIS — R29898 Other symptoms and signs involving the musculoskeletal system: Secondary | ICD-10-CM

## 2023-01-16 DIAGNOSIS — I10 Essential (primary) hypertension: Secondary | ICD-10-CM

## 2023-01-16 DIAGNOSIS — E782 Mixed hyperlipidemia: Secondary | ICD-10-CM | POA: Diagnosis not present

## 2023-01-16 HISTORY — DX: Other symptoms and signs involving the musculoskeletal system: R29.898

## 2023-01-16 MED ORDER — PRAMIPEXOLE DIHYDROCHLORIDE 0.25 MG PO TABS: 0.2500 mg | ORAL_TABLET | Freq: Every day | ORAL | 2 refills | Status: DC

## 2023-01-16 NOTE — Assessment & Plan Note (Signed)
Patient was currently out of her Mirapex.  States that it does help her a lot with her restless leg.  Resent to the pharmacy

## 2023-01-16 NOTE — Assessment & Plan Note (Signed)
Will continue to take the Rosuvastatin and check labs at next chronic visit to assess decreasing the dose or changing the statin due to the side effect of increased stiffness.

## 2023-01-16 NOTE — Assessment & Plan Note (Signed)
Well controlled.  Continue to work on eating a healthy diet and exercise.  No major side effects reported, and no issues with compliance. The current medical regimen is effective;  continue present plan with HCTZ 25mg , Benazepril 40mg .

## 2023-01-16 NOTE — Assessment & Plan Note (Signed)
Discussed with patient how due to her weakness that has affected multiple activities of daily living.  Referred to physical therapy to help her try to strengthen her legs and core.

## 2023-01-16 NOTE — Progress Notes (Signed)
Subjective:  Patient ID: Susan Roberson, female    DOB: Dec 15, 1950  Age: 72 y.o. MRN: [id number]  Chief Complaint  Patient presents with   Hypertension    HPI:   HYPERTENSION: Started on benazepril 40 mg daily last office visit. States she is till urinating a lot but states after looking her medications she never was taking lasix. Also has HYDROCHLOROTHIAZIDE 25 mg  Also was started on atorvastatin after labs were reviewed. Patient is very stiff, reports prior to medication she was stiff however it is worse sine starting lipitor. Patient did ask about being on 40mg  and that a friend is on 10mg . Discussed that her numbers may be higher then her friends or that the 10mg  is just as affective for her friend.   Patient states that she has severe leg weakness along with back stiffness. Patient has a hard time standing up from any chair and states she will not use a toilet that doesn't have a rail or something to help her stand up. Patient denies any numbness in her legs or feet. Patient has severe difficulty putting on her shoes due to an inability to bend that far down or pull her lug up to put them on.    01/16/2023    7:50 AM 01/17/2022   10:06 AM 09/12/2021    7:43 AM 07/02/2020    9:19 AM 06/25/2020   10:32 AM  Depression screen PHQ 2/9  Decreased Interest 0 0 0 0 0  Down, Depressed, Hopeless 0 0 0 0 0  PHQ - 2 Score 0 0 0 0 0  Altered sleeping 0   0   Tired, decreased energy 0   0   Change in appetite 0   0   Feeling bad or failure about yourself  0   0   Trouble concentrating 0   0   Moving slowly or fidgety/restless 0   0   Suicidal thoughts 0   0   PHQ-9 Score 0   0   Difficult doing work/chores Not difficult at all            01/16/2023    7:50 AM  Fall Risk   Falls in the past year? 0  Number falls in past yr: 0  Injury with Fall? 0  Risk for fall due to : No Fall Risks  Follow up Falls evaluation completed    Patient Care Team: Langley Gauss, [place] as PCP - General  (Physician Assistant)   Review of Systems  Constitutional:  Negative for chills, fatigue and fever.  HENT:  Negative for congestion, ear pain, rhinorrhea and sore throat.   Respiratory:  Negative for cough and shortness of breath.   Cardiovascular:  Negative for chest pain.  Gastrointestinal:  Negative for abdominal pain, constipation, diarrhea, nausea and vomiting.  Genitourinary:  Negative for dysuria and urgency.  Musculoskeletal:  Negative for back pain and myalgias.  Neurological:  Negative for dizziness, weakness, light-headedness and headaches.  Psychiatric/Behavioral:  Negative for dysphoric mood. The patient is not nervous/anxious.     Current Outpatient Medications on File Prior to Visit  Medication Sig Dispense Refill   atorvastatin (LIPITOR) 40 MG tablet Take 1 tablet (40 mg total) by mouth daily. 90 tablet 0   benazepril (LOTENSIN) 40 MG tablet Take 1 tablet (40 mg total) by mouth daily. 90 tablet 3   celecoxib (CELEBREX) 100 MG capsule Take 1 capsule (100 mg total) by mouth daily. 90 capsule 0   cyanocobalamin (VITAMIN  B12) 1000 MCG tablet Take 1 tablet (1,000 mcg total) by mouth daily. 90 tablet 0   hydrochlorothiazide (HYDRODIURIL) 25 MG tablet Take 1 tablet (25 mg total) by mouth daily. 90 tablet 0   Vitamin D, Ergocalciferol, (DRISDOL) 1.25 MG (50000 UNIT) CAPS capsule Take 1 capsule (50,000 Units total) by mouth every 7 (seven) days. 5 capsule 2   No current facility-administered medications on file prior to visit.   Past Medical History:  Diagnosis Date   Arthritis    Cancer (HCC)    pre cancerous skin cancer with multiple lesions removed   Complication of anesthesia    broken teeth   Essential (primary) hypertension    Generalized anxiety disorder    Hyperkalemia    Hypertension    Past Surgical History:  Procedure Laterality Date   SKIN CANCER EXCISION Bilateral    legs   TOTAL HIP ARTHROPLASTY Right    5 yrs ago   TOTAL KNEE ARTHROPLASTY Right  03/10/2016   Procedure: RIGHT TOTAL KNEE ARTHROPLASTY;  Surgeon: Dannielle Huh, MD;  Location: [facility] OR;  Service: Orthopedics;  Laterality: Right;    Family History  Problem Relation Age of Onset   Stroke Father    Cerebrovascular Accident Other    Diabetes Mellitus II Other    Hyperlipidemia Other    Hypertension Other    Social History   Socioeconomic History   Marital status: Married    Spouse name: Not on file   Number of children: Not on file   Years of education: Not on file   Highest education level: Not on file  Occupational History   Not on file  Tobacco Use   Smoking status: Never   Smokeless tobacco: Never  Vaping Use   Vaping Use: Never used  Substance and Sexual Activity   Alcohol use: Yes    Comment: Drinks on a social basis.   Drug use: No   Sexual activity: Not on file  Other Topics Concern   Not on file  Social History Narrative   Not on file   Social Determinants of Health   Financial Resource Strain: Low Risk  (07/02/2020)   Overall Financial Resource Strain (CARDIA)    Difficulty of Paying Living Expenses: Not hard at all  Food Insecurity: No Food Insecurity (01/17/2022)   Hunger Vital Sign    Worried About Running Out of Food in the Last Year: Never true    Ran Out of Food in the Last Year: Never true  Transportation Needs: No Transportation Needs (07/02/2020)   PRAPARE - Administrator, Civil Service (Medical): No    Lack of Transportation (Non-Medical): No  Physical Activity: Sufficiently Active (07/02/2020)   Exercise Vital Sign    Days of Exercise per Week: 6 days    Minutes of Exercise per Session: 30 min  Stress: No Stress Concern Present (07/02/2020)   [employer] of Occupational Health - Occupational Stress Questionnaire    Feeling of Stress : Not at all  Social Connections: Socially Integrated (07/02/2020)   Social Connection and Isolation Panel [NHANES]    Frequency of Communication with Friends and Family: More than  three times a week    Frequency of Social Gatherings with Friends and Family: More than three times a week    Attends Religious Services: More than 4 times per year    Active Member of [employer] or Organizations: Yes    Attends Banker Meetings: More than 4 times per year  Marital Status: Married    Objective:  BP 130/74   Pulse 69   Temp (!) 96.7 F (35.9 C)   Ht 5\' 10"  (1.778 m)   Wt 186 lb (84.4 kg)   SpO2 98%   BMI 26.69 kg/m      01/16/2023    7:40 AM 12/18/2022    9:18 AM 12/18/2022    8:39 AM  BP/Weight  Systolic BP 130 164 170  Diastolic BP 74 96 100  Wt. (Lbs) 186  183  BMI 26.69 kg/m2  27.02 kg/m2    Physical Exam Vitals reviewed.  Constitutional:      Appearance: Normal appearance. She is normal weight.  Neck:     Vascular: No carotid bruit.  Cardiovascular:     Rate and Rhythm: Normal rate and regular rhythm.     Heart sounds: Normal heart sounds.  Pulmonary:     Effort: Pulmonary effort is normal. No respiratory distress.     Breath sounds: Normal breath sounds.  Abdominal:     General: Abdomen is flat. Bowel sounds are normal.     Palpations: Abdomen is soft.     Tenderness: There is no abdominal tenderness.  Neurological:     Mental Status: She is alert and oriented to person, place, and time.  Psychiatric:        Mood and Affect: Mood normal.        Behavior: Behavior normal.     Diabetic Foot Exam - Simple   No data filed      Lab Results  Component Value Date   WBC 7.0 12/18/2022   HGB 13.9 12/18/2022   HCT 42.1 12/18/2022   PLT 369 12/18/2022   GLUCOSE 98 12/18/2022   CHOL 290 (H) 12/18/2022   TRIG 204 (H) 12/18/2022   HDL 62 12/18/2022   LDLCALC 190 (H) 12/18/2022   ALT 18 12/18/2022   AST 23 12/18/2022   NA 140 12/18/2022   K 5.4 (H) 12/18/2022   CL 100 12/18/2022   CREATININE 0.96 12/18/2022   BUN 18 12/18/2022   CO2 21 12/18/2022   TSH 2.530 12/18/2022   INR 0.91 02/28/2016   HGBA1C 6.0 (H) 12/18/2022       Assessment & Plan:    Mixed hyperlipidemia Assessment & Plan: Will continue to take the Rosuvastatin and check labs at next chronic visit to assess decreasing the dose or changing the statin due to the side effect of increased stiffness.    Leg weakness, bilateral Assessment & Plan: Discussed with patient how due to her weakness that has affected multiple activities of daily living.  Referred to physical therapy to help her try to strengthen her legs and core.  Orders: -     Ambulatory referral to Physical Therapy  Restless legs Assessment & Plan: Patient was currently out of her Mirapex.  States that it does help her a lot with her restless leg.  Resent to the pharmacy  Orders: -     Pramipexole Dihydrochloride; Take 1 tablet (0.25 mg total) by mouth at bedtime.  Dispense: 90 tablet; Refill: 2  Essential hypertension, benign Assessment & Plan: Well controlled.  Continue to work on eating a healthy diet and exercise.  No major side effects reported, and no issues with compliance. The current medical regimen is effective;  continue present plan with HCTZ 25mg , Benazepril 40mg .       Meds ordered this encounter  Medications   pramipexole (MIRAPEX) 0.25 MG tablet    Sig:  Take 1 tablet (0.25 mg total) by mouth at bedtime.    Dispense:  90 tablet    Refill:  2    Orders Placed This Encounter  Procedures   Ambulatory referral to Physical Therapy     Follow-up: Return in about 3 months (around 04/18/2023) for [facility], Chronic.   I,Katherina A Bramblett,acting as a scribe for [employer], PA.,have documented all relevant documentation on the behalf of Langley Gauss, PA,as directed by  Langley Gauss, PA while in the presence of Langley Gauss, [place].   An After Visit Summary was printed and given to the patient.  Langley Gauss, [place] [facility] [phone]

## 2023-02-21 ENCOUNTER — Other Ambulatory Visit: Payer: Self-pay | Admitting: Family Medicine

## 2023-02-21 DIAGNOSIS — I10 Essential (primary) hypertension: Secondary | ICD-10-CM

## 2023-03-09 ENCOUNTER — Other Ambulatory Visit: Payer: Self-pay

## 2023-03-09 DIAGNOSIS — M1991 Primary osteoarthritis, unspecified site: Secondary | ICD-10-CM

## 2023-03-09 MED ORDER — CELECOXIB 100 MG PO CAPS: 100.0000 mg | ORAL_CAPSULE | Freq: Every day | ORAL | 0 refills | Status: DC

## 2023-03-23 ENCOUNTER — Other Ambulatory Visit: Payer: Self-pay | Admitting: Physician Assistant

## 2023-03-23 DIAGNOSIS — M1991 Primary osteoarthritis, unspecified site: Secondary | ICD-10-CM

## 2023-03-26 ENCOUNTER — Telehealth: Payer: Self-pay

## 2023-03-26 NOTE — Telephone Encounter (Signed)
I called patient to schedule AWV, while speaking with patient she asked that I send you a message:      She wanted to thank you and to let you know she is almost done with therapy and it has helped tremendously.

## 2023-03-26 NOTE — Telephone Encounter (Signed)
Susan Roberson husband called to report that his wife has been receiving multiple notification through my chart and she is not comfortable with this.  She prefers to be called at [phone].  I called back and left a message that we will remove her from mychart.

## 2023-03-31 ENCOUNTER — Ambulatory Visit (INDEPENDENT_AMBULATORY_CARE_PROVIDER_SITE_OTHER): Payer: Medicare Other

## 2023-03-31 VITALS — Ht 70.0 in | Wt 186.0 lb

## 2023-03-31 DIAGNOSIS — Z0000 Encounter for general adult medical examination without abnormal findings: Secondary | ICD-10-CM

## 2023-03-31 NOTE — Patient Instructions (Addendum)
Susan Roberson , Thank you for taking time to come for your Medicare Wellness Visit. I appreciate your ongoing commitment to your health goals. Please review the following plan we discussed and let me know if I can assist you in the future.   Referrals/Orders/Follow-Ups/Clinician Recommendations:   This is a list of the screening recommended for you and due dates:  Health Maintenance  Topic Date Due   DTaP/Tdap/Td vaccine (1 - Tdap) Never done   Zoster (Shingles) Vaccine (1 of 2) 09/23/1967   Colon Cancer Screening  08/22/2017   COVID-19 Vaccine (3 - Moderna risk series) 06/28/2021   Pneumonia Vaccine (2 of 2 - PCV) 07/02/2021   Flu Shot  03/19/2023   Mammogram  11/01/2023   Medicare Annual Wellness Visit  03/30/2024   DEXA scan (bone density measurement)  Completed   HPV Vaccine  Aged Out   Hepatitis C Screening  Discontinued    Advanced directives: (Copy Requested) Please bring a copy of your health care power of attorney and living will to the office to be added to your chart at your convenience.  Next Medicare Annual Wellness Visit scheduled for next year: Yes  Preventive Care 74 Years and Older, Female Preventive care refers to lifestyle choices and visits with your health care provider that can promote health and wellness. What does preventive care include? A yearly physical exam. This is also called an annual well check. Dental exams once or twice a year. Routine eye exams. Ask your health care provider how often you should have your eyes checked. Personal lifestyle choices, including: Daily care of your teeth and gums. Regular physical activity. Eating a healthy diet. Avoiding tobacco and drug use. Limiting alcohol use. Practicing safe sex. Taking low-dose aspirin every day. Taking vitamin and mineral supplements as recommended by your health care provider. What happens during an annual well check? The services and screenings done by your health care provider during your  annual well check will depend on your age, overall health, lifestyle risk factors, and family history of disease. Counseling  Your health care provider may ask you questions about your: Alcohol use. Tobacco use. Drug use. Emotional well-being. Home and relationship well-being. Sexual activity. Eating habits. History of falls. Memory and ability to understand (cognition). Work and work Astronomer. Reproductive health. Screening  You may have the following tests or measurements: Height, weight, and BMI. Blood pressure. Lipid and cholesterol levels. These may be checked every 5 years, or more frequently if you are over 70 years old. Skin check. Lung cancer screening. You may have this screening every year starting at age 74 if you have a 30-pack-year history of smoking and currently smoke or have quit within the past 15 years. Fecal occult blood test (FOBT) of the stool. You may have this test every year starting at age 74. Flexible sigmoidoscopy or colonoscopy. You may have a sigmoidoscopy every 5 years or a colonoscopy every 10 years starting at age 74. Hepatitis C blood test. Hepatitis B blood test. Sexually transmitted disease (STD) testing. Diabetes screening. This is done by checking your blood sugar (glucose) after you have not eaten for a while (fasting). You may have this done every 1-3 years. Bone density scan. This is done to screen for osteoporosis. You may have this done starting at age 74. Mammogram. This may be done every 1-2 years. Talk to your health care provider about how often you should have regular mammograms. Talk with your health care provider about your test results, treatment  options, and if necessary, the need for more tests. Vaccines  Your health care provider may recommend certain vaccines, such as: Influenza vaccine. This is recommended every year. Tetanus, diphtheria, and acellular pertussis (Tdap, Td) vaccine. You may need a Td booster every 10  years. Zoster vaccine. You may need this after age 75. Pneumococcal 13-valent conjugate (PCV13) vaccine. One dose is recommended after age 65. Pneumococcal polysaccharide (PPSV23) vaccine. One dose is recommended after age 74. Talk to your health care provider about which screenings and vaccines you need and how often you need them. This information is not intended to replace advice given to you by your health care provider. Make sure you discuss any questions you have with your health care provider. Document Released: 08/31/2015 Document Revised: 04/23/2016 Document Reviewed: 06/05/2015 Elsevier Interactive Patient Education  2017 [employer].  Fall Prevention in the Home Falls can cause injuries. They can happen to people of all ages. There are many things you can do to make your home safe and to help prevent falls. What can I do on the outside of my home? Regularly fix the edges of walkways and driveways and fix any cracks. Remove anything that might make you trip as you walk through a door, such as a raised step or threshold. Trim any bushes or trees on the path to your home. Use bright outdoor lighting. Clear any walking paths of anything that might make someone trip, such as rocks or tools. Regularly check to see if handrails are loose or broken. Make sure that both sides of any steps have handrails. Any raised decks and porches should have guardrails on the edges. Have any leaves, snow, or ice cleared regularly. Use sand or salt on walking paths during winter. Clean up any spills in your garage right away. This includes oil or grease spills. What can I do in the bathroom? Use night lights. Install grab bars by the toilet and in the tub and shower. Do not use towel bars as grab bars. Use non-skid mats or decals in the tub or shower. If you need to sit down in the shower, use a plastic, non-slip stool. Keep the floor dry. Clean up any water that spills on the floor as soon as it  happens. Remove soap buildup in the tub or shower regularly. Attach bath mats securely with double-sided non-slip rug tape. Do not have throw rugs and other things on the floor that can make you trip. What can I do in the bedroom? Use night lights. Make sure that you have a light by your bed that is easy to reach. Do not use any sheets or blankets that are too big for your bed. They should not hang down onto the floor. Have a firm chair that has side arms. You can use this for support while you get dressed. Do not have throw rugs and other things on the floor that can make you trip. What can I do in the kitchen? Clean up any spills right away. Avoid walking on wet floors. Keep items that you use a lot in easy-to-reach places. If you need to reach something above you, use a strong step stool that has a grab bar. Keep electrical cords out of the way. Do not use floor polish or wax that makes floors slippery. If you must use wax, use non-skid floor wax. Do not have throw rugs and other things on the floor that can make you trip. What can I do with my stairs? Do not  leave any items on the stairs. Make sure that there are handrails on both sides of the stairs and use them. Fix handrails that are broken or loose. Make sure that handrails are as long as the stairways. Check any carpeting to make sure that it is firmly attached to the stairs. Fix any carpet that is loose or worn. Avoid having throw rugs at the top or bottom of the stairs. If you do have throw rugs, attach them to the floor with carpet tape. Make sure that you have a light switch at the top of the stairs and the bottom of the stairs. If you do not have them, ask someone to add them for you. What else can I do to help prevent falls? Wear shoes that: Do not have high heels. Have rubber bottoms. Are comfortable and fit you well. Are closed at the toe. Do not wear sandals. If you use a stepladder: Make sure that it is fully opened.  Do not climb a closed stepladder. Make sure that both sides of the stepladder are locked into place. Ask someone to hold it for you, if possible. Clearly mark and make sure that you can see: Any grab bars or handrails. First and last steps. Where the edge of each step is. Use tools that help you move around (mobility aids) if they are needed. These include: Canes. Walkers. Scooters. Crutches. Turn on the lights when you go into a dark area. Replace any light bulbs as soon as they burn out. Set up your furniture so you have a clear path. Avoid moving your furniture around. If any of your floors are uneven, fix them. If there are any pets around you, be aware of where they are. Review your medicines with your doctor. Some medicines can make you feel dizzy. This can increase your chance of falling. Ask your doctor what other things that you can do to help prevent falls. This information is not intended to replace advice given to you by your health care provider. Make sure you discuss any questions you have with your health care provider. Document Released: 05/31/2009 Document Revised: 01/10/2016 Document Reviewed: 09/08/2014 Elsevier Interactive Patient Education  2017 [employer].

## 2023-03-31 NOTE — Progress Notes (Signed)
Subjective:   Susan Roberson is a 72 y.o. female who presents for Medicare Annual (Subsequent) preventive examination.  Visit Complete: Virtual  I connected with  Susan Roberson on 03/31/23 by a audio enabled telemedicine application and verified that I am speaking with the correct person using two identifiers.  Patient Location: Home  Provider Location: Home Office  I discussed the limitations of evaluation and management by telemedicine. The patient expressed understanding and agreed to proceed.  Patient Medicare AWV questionnaire was completed by the patient on  ; I have confirmed that all information answered by patient is correct and no changes since this date.  Review of Systems    Vital Signs: Unable to obtain new vitals due to this being a telehealth visit.  Cardiac Risk Factors include: advanced age (>10men, >48 women);hypertension     Objective:    Today's Vitals   03/31/23 1033  Weight: 186 lb (84.4 kg)  Height: 5\' 10"  (1.778 m)   Body mass index is 26.69 kg/m.     03/31/2023   10:40 AM 02/28/2016    3:20 PM  Advanced Directives  Does Patient Have a Medical Advance Directive? Yes Yes  Type of Estate agent of [place];Living will   Copy of Healthcare Power of Attorney in Chart? No - copy requested No - copy requested    Current Medications (verified) Outpatient Encounter Medications as of 03/31/2023  Medication Sig   atorvastatin (LIPITOR) 40 MG tablet Take 1 tablet (40 mg total) by mouth daily.   benazepril (LOTENSIN) 40 MG tablet Take 1 tablet (40 mg total) by mouth daily.   celecoxib (CELEBREX) 100 MG capsule Take 1 capsule (100 mg total) by mouth daily.   cyanocobalamin (VITAMIN B12) 1000 MCG tablet Take 1 tablet (1,000 mcg total) by mouth daily.   hydrochlorothiazide (HYDRODIURIL) 25 MG tablet Take 1 tablet by mouth daily.   pramipexole (MIRAPEX) 0.25 MG tablet Take 1 tablet (0.25 mg total) by mouth at bedtime.   Vitamin D,  Ergocalciferol, (DRISDOL) 1.25 MG (50000 UNIT) CAPS capsule Take 1 capsule (50,000 Units total) by mouth every 7 (seven) days.   No facility-administered encounter medications on file as of 03/31/2023.    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   Complication of anesthesia    broken teeth   Essential (primary) hypertension    Generalized anxiety disorder    Hyperkalemia    Hypertension    Past Surgical History:  Procedure Laterality Date   SKIN CANCER EXCISION Bilateral    legs   TOTAL HIP ARTHROPLASTY Right    5 yrs ago   TOTAL KNEE ARTHROPLASTY Right 03/10/2016   Procedure: RIGHT TOTAL KNEE ARTHROPLASTY;  Surgeon: Dannielle Huh, MD;  Location: [facility] OR;  Service: Orthopedics;  Laterality: Right;   Family History  Problem Relation Age of Onset   Stroke Father    Cerebrovascular Accident Other    Diabetes Mellitus II Other    Hyperlipidemia Other    Hypertension Other    Social History   Socioeconomic History   Marital status: Married    Spouse name: Not on file   Number of children: Not on file   Years of education: Not on file   Highest education level: Not on file  Occupational History   Not on file  Tobacco  Use   Smoking status: Never   Smokeless tobacco: Never  Vaping Use   Vaping status: Never Used  Substance and Sexual Activity   Alcohol use: Yes    Comment: Drinks on a social basis.   Drug use: No   Sexual activity: Not on file  Other Topics Concern   Not on file  Social History Narrative   Not on file   Social Determinants of Health   Financial Resource Strain: Low Risk  (03/31/2023)   Overall Financial Resource Strain (CARDIA)    Difficulty of Paying Living Expenses: Not hard at all  Food Insecurity: No Food Insecurity (03/31/2023)   Hunger Vital Sign    Worried About  Running Out of Food in the Last Year: Never true    Ran Out of Food in the Last Year: Never true  Transportation Needs: No Transportation Needs (03/31/2023)   PRAPARE - Administrator, Civil Service (Medical): No    Lack of Transportation (Non-Medical): No  Physical Activity: Sufficiently Active (03/31/2023)   Exercise Vital Sign    Days of Exercise per Week: 5 days    Minutes of Exercise per Session: 60 min  Stress: No Stress Concern Present (03/31/2023)   [employer] of Occupational Health - Occupational Stress Questionnaire    Feeling of Stress : Not at all  Social Connections: Socially Integrated (03/31/2023)   Social Connection and Isolation Panel [NHANES]    Frequency of Communication with Friends and Family: More than three times a week    Frequency of Social Gatherings with Friends and Family: More than three times a week    Attends Religious Services: More than 4 times per year    Active Member of [employer] or Organizations: Yes    Attends Engineer, structural: More than 4 times per year    Marital Status: Married    Tobacco Counseling Counseling given: Not Answered   Clinical Intake:  Pre-visit preparation completed: No  Pain : No/denies pain     BMI - recorded: 26.69 Nutritional Status: BMI 25 -29 Overweight Nutritional Risks: None Diabetes: No  How often do you need to have someone help you when you read instructions, pamphlets, or other written materials from your doctor or pharmacy?: 1 - Never  Interpreter Needed?: No  Information entered by :: Theresa Mulligan LPN   Activities of Daily Living    03/31/2023   10:40 AM  In your present state of health, do you have any difficulty performing the following activities:  Hearing? 0  Vision? 0  Difficulty concentrating or making decisions? 0  Walking or climbing stairs? 0  Dressing or bathing? 0  Doing errands, shopping? 0  Preparing Food and eating ? N  Using the Toilet? N  In the  past six months, have you accidently leaked urine? N  Do you have problems with loss of bowel control? N  Managing your Medications? N  Managing your Finances? N  Housekeeping or managing your Housekeeping? N    Patient Care Team: Langley Gauss, [place] as PCP - General (Physician Assistant)  Indicate any recent Medical Services you may have received from other than [facility] providers in the past year (date may be approximate).     Assessment:   This is a routine wellness examination for Meliana.  Hearing/Vision screen Hearing Screening - Comments:: Denies hearing difficulties   Vision Screening - Comments:: Wears rx glasses - up to date with routine eye exams with  [facility] care  Dietary issues and exercise activities discussed:     Goals Addressed               This Visit's Progress     Stay Fit (pt-stated)         Depression Screen    03/31/2023   10:38 AM 01/16/2023    7:50 AM 01/17/2022   10:06 AM 09/12/2021    7:43 AM 07/02/2020    9:19 AM 06/25/2020   10:32 AM  PHQ 2/9 Scores  PHQ - 2 Score 0 0 0 0 0 0  PHQ- 9 Score  0   0     Fall Risk    03/31/2023   10:40 AM 01/16/2023    7:50 AM 01/17/2022   10:12 AM 09/12/2021    7:43 AM 07/02/2020    9:19 AM  Fall Risk   Falls in the past year? 0 0 0 0 0  Number falls in past yr: 0 0 0 0 0  Injury with Fall? 0 0 0 0 0  Risk for fall due to : No Fall Risks No Fall Risks No Fall Risks No Fall Risks   Follow up Falls prevention discussed Falls evaluation completed Falls evaluation completed;Education provided Falls evaluation completed     MEDICARE RISK AT HOME:  Medicare Risk at Home - 03/31/23 1047     Any stairs in or around the home? No    If so, are there any without handrails? No    Home free of loose throw rugs in walkways, pet beds, electrical cords, etc? Yes    Adequate lighting in your home to reduce risk of falls? Yes    Life alert? No    Use of a cane, walker or w/c? No    Grab bars in the bathroom? No     Shower chair or bench in shower? No    Elevated toilet seat or a handicapped toilet? Yes             TIMED UP AND GO:  Was the test performed?  No    Cognitive Function:        03/31/2023   10:41 AM 01/17/2022   10:16 AM 07/02/2020    9:17 AM  6CIT Screen  What Year? 0 points 0 points 0 points  What month? 0 points 0 points 0 points  What time? 0 points 0 points 0 points  Count back from 20 0 points 0 points 0 points  Months in reverse 0 points 0 points 0 points  Repeat phrase 0 points 0 points 0 points  Total Score 0 points 0 points 0 points    Immunizations Immunization History  Administered Date(s) Administered   Fluad Quad(high Dose 65+) 06/25/2020   Influenza-Unspecified 05/31/2021   Moderna Covid-19 Vaccine Bivalent Booster 61yrs & up 05/31/2021   Moderna Sars-Covid-2 Vaccination 07/20/2020   Pneumococcal Polysaccharide-23 07/02/2020   Zoster, Live 07/04/2013    TDAP status: Due, Education has been provided regarding the importance of this vaccine. Advised may receive this vaccine at local pharmacy or Health Dept. Aware to provide a copy of the vaccination record if obtained from local pharmacy or Health Dept. Verbalized acceptance and understanding.  Flu Vaccine status: Due, Education has been provided regarding the importance of this vaccine. Advised may receive this vaccine at local pharmacy or Health Dept. Aware to provide a copy of the vaccination record if obtained from local pharmacy or Health Dept. Verbalized acceptance and understanding.  Pneumococcal vaccine status: Due,  Education has been provided regarding the importance of this vaccine. Advised may receive this vaccine at local pharmacy or Health Dept. Aware to provide a copy of the vaccination record if obtained from local pharmacy or Health Dept. Verbalized acceptance and understanding.  Covid-19 vaccine status: Declined, Education has been provided regarding the importance of this vaccine but  patient still declined. Advised may receive this vaccine at local pharmacy or Health Dept.or vaccine clinic. Aware to provide a copy of the vaccination record if obtained from local pharmacy or Health Dept. Verbalized acceptance and understanding.  Qualifies for Shingles Vaccine? Yes   Zostavax completed No   Shingrix Completed?: No.    Education has been provided regarding the importance of this vaccine. Patient has been advised to call insurance company to determine out of pocket expense if they have not yet received this vaccine. Advised may also receive vaccine at local pharmacy or Health Dept. Verbalized acceptance and understanding.  Screening Tests Health Maintenance  Topic Date Due   DTaP/Tdap/Td (1 - Tdap) Never done   Zoster Vaccines- Shingrix (1 of 2) 09/23/1967   Colonoscopy  08/22/2017   COVID-19 Vaccine (3 - Moderna risk series) 06/28/2021   Pneumonia Vaccine 83+ Years old (2 of 2 - PCV) 07/02/2021   INFLUENZA VACCINE  03/19/2023   MAMMOGRAM  11/01/2023   Medicare Annual Wellness (AWV)  03/30/2024   DEXA SCAN  Completed   HPV VACCINES  Aged Out   Hepatitis C Screening  Discontinued    Health Maintenance  Health Maintenance Due  Topic Date Due   DTaP/Tdap/Td (1 - Tdap) Never done   Zoster Vaccines- Shingrix (1 of 2) 09/23/1967   Colonoscopy  08/22/2017   COVID-19 Vaccine (3 - Moderna risk series) 06/28/2021   Pneumonia Vaccine 73+ Years old (2 of 2 - PCV) 07/02/2021   INFLUENZA VACCINE  03/19/2023    Colorectal cancer screening: Referral to GI placed Patient declined. Pt aware the office will call re: appt.  Mammogram status: Completed 10/31/21. Repeat every year  Bone Density status: Completed 10/31/21. Results reflect: Bone density results: OSTEOPENIA. Repeat every   years.  Lung Cancer Screening: (Low Dose CT Chest recommended if Age 12-80 years, 20 pack-year currently smoking OR have quit w/in 15years.) does not qualify.    Additional Screening:    Vision  Screening: Recommended annual ophthalmology exams for early detection of glaucoma and other disorders of the eye. Is the patient up to date with their annual eye exam?  Yes  Who is the provider or what is the name of the office in which the patient attends annual eye exams? [facility] If pt is not established with a provider, would they like to be referred to a provider to establish care? No .   Dental Screening: Recommended annual dental exams for proper oral hygiene    Community Resource Referral / Chronic Care Management:  CRR required this visit?  No   CCM required this visit?  No     Plan:     I have personally reviewed and noted the following in the patient's chart:   Medical and social history Use of alcohol, tobacco or illicit drugs  Current medications and supplements including opioid prescriptions. Patient is not currently taking opioid prescriptions. Functional ability and status Nutritional status Physical activity Advanced directives List of other physicians Hospitalizations, surgeries, and ER visits in previous 12 months Vitals Screenings to include cognitive, depression, and falls Referrals and appointments  In addition, I have reviewed  and discussed with patient certain preventive protocols, quality metrics, and best practice recommendations. A written personalized care plan for preventive services as well as general preventive health recommendations were provided to patient.     Tillie Rung, LPN   6/96/2952   After Visit Summary: (MyChart) Due to this being a telephonic visit, the after visit summary with patients personalized plan was offered to patient via MyChart   Nurse Notes: None

## 2023-04-07 ENCOUNTER — Other Ambulatory Visit: Payer: Self-pay

## 2023-04-07 DIAGNOSIS — M1991 Primary osteoarthritis, unspecified site: Secondary | ICD-10-CM

## 2023-04-07 MED ORDER — CELECOXIB 100 MG PO CAPS: 100.0000 mg | ORAL_CAPSULE | Freq: Every day | ORAL | 0 refills | Status: DC

## 2023-04-15 ENCOUNTER — Other Ambulatory Visit: Payer: Self-pay | Admitting: Physician Assistant

## 2023-04-15 DIAGNOSIS — E782 Mixed hyperlipidemia: Secondary | ICD-10-CM

## 2023-04-18 ENCOUNTER — Other Ambulatory Visit: Payer: Self-pay

## 2023-04-18 DIAGNOSIS — I10 Essential (primary) hypertension: Secondary | ICD-10-CM

## 2023-04-18 DIAGNOSIS — E782 Mixed hyperlipidemia: Secondary | ICD-10-CM

## 2023-04-18 DIAGNOSIS — E538 Deficiency of other specified B group vitamins: Secondary | ICD-10-CM

## 2023-04-18 DIAGNOSIS — R7303 Prediabetes: Secondary | ICD-10-CM

## 2023-04-18 NOTE — Progress Notes (Unsigned)
Subjective:  Patient ID: Susan Roberson, female    DOB: September 07, 1950  Age: 72 y.o. MRN: [id number]  No chief complaint on file.   HPI   Susan Roberson is a 71 year old Caucasian female that presents for follow-up of hypertension and hyperlipidemia.   Hypertension, follow-up:  She was last seen for hypertension 3 months ago.  BP at that visit was  Management since that visit includes benazepril 20 mg and HCTZ 25 mg.  She reports excellent compliance with treatment. She is not having side effects.  She is following a Regular diet. She is not exercising. She does not smoke.  Use of agents associated with hypertension:HCTZ and Benazepril.   Lipid/Cholesterol, Follow-up   She was last seen for this 3 months ago.  Management since that visit includes atorvastatin  She reports excellent compliance with treatment. She is not having side effects.      03/31/2023   10:38 AM 01/16/2023    7:50 AM 01/17/2022   10:06 AM 09/12/2021    7:43 AM 07/02/2020    9:19 AM  Depression screen PHQ 2/9  Decreased Interest 0 0 0 0 0  Down, Depressed, Hopeless 0 0 0 0 0  PHQ - 2 Score 0 0 0 0 0  Altered sleeping  0   0  Tired, decreased energy  0   0  Change in appetite  0   0  Feeling bad or failure about yourself   0   0  Trouble concentrating  0   0  Moving slowly or fidgety/restless  0   0  Suicidal thoughts  0   0  PHQ-9 Score  0   0  Difficult doing work/chores  Not difficult at all           03/31/2023   10:40 AM  Fall Risk   Falls in the past year? 0  Number falls in past yr: 0  Injury with Fall? 0  Risk for fall due to : No Fall Risks  Follow up Falls prevention discussed    Patient Care Team: Langley Gauss, [place] as PCP - General (Physician Assistant)   Review of Systems  Current Outpatient Medications on File Prior to Visit  Medication Sig Dispense Refill   atorvastatin (LIPITOR) 40 MG tablet Take 1 tablet (40 mg total) by mouth daily. 90 tablet 0   benazepril (LOTENSIN) 40 MG tablet  Take 1 tablet (40 mg total) by mouth daily. 90 tablet 3   celecoxib (CELEBREX) 100 MG capsule Take 1 capsule (100 mg total) by mouth daily. 90 capsule 0   cyanocobalamin (VITAMIN B12) 1000 MCG tablet Take 1 tablet (1,000 mcg total) by mouth daily. 90 tablet 0   hydrochlorothiazide (HYDRODIURIL) 25 MG tablet Take 1 tablet by mouth daily. 90 tablet 0   pramipexole (MIRAPEX) 0.25 MG tablet Take 1 tablet (0.25 mg total) by mouth at bedtime. 90 tablet 2   Vitamin D, Ergocalciferol, (DRISDOL) 1.25 MG (50000 UNIT) CAPS capsule Take 1 capsule (50,000 Units total) by mouth every 7 (seven) days. 5 capsule 2   No current facility-administered medications on file prior to visit.   Past Medical History:  Diagnosis Date   Arthritis    Cancer (HCC)    pre cancerous skin cancer with multiple lesions removed   Complication of anesthesia    broken teeth   Essential (primary) hypertension    Generalized anxiety disorder    Hyperkalemia    Hypertension    Past Surgical History:  Procedure  Laterality Date   SKIN CANCER EXCISION Bilateral    legs   TOTAL HIP ARTHROPLASTY Right    5 yrs ago   TOTAL Roberson ARTHROPLASTY Right 03/10/2016   Procedure: RIGHT TOTAL Roberson ARTHROPLASTY;  Surgeon: Dannielle Huh, MD;  Location: [facility] OR;  Service: Orthopedics;  Laterality: Right;    Family History  Problem Relation Age of Onset   Stroke Father    Cerebrovascular Accident Other    Diabetes Mellitus II Other    Hyperlipidemia Other    Hypertension Other    Social History   Socioeconomic History   Marital status: Married    Spouse name: Not on file   Number of children: Not on file   Years of education: Not on file   Highest education level: Not on file  Occupational History   Not on file  Tobacco Use   Smoking status: Never   Smokeless tobacco: Never  Vaping Use   Vaping status: Never Used  Substance and Sexual Activity   Alcohol use: Yes    Comment: Drinks on a social basis.   Drug use: No   Sexual  activity: Not on file  Other Topics Concern   Not on file  Social History Narrative   Not on file   Social Determinants of Health   Financial Resource Strain: Low Risk  (03/31/2023)   Overall Financial Resource Strain (CARDIA)    Difficulty of Paying Living Expenses: Not hard at all  Food Insecurity: No Food Insecurity (03/31/2023)   Hunger Vital Sign    Worried About Running Out of Food in the Last Year: Never true    Ran Out of Food in the Last Year: Never true  Transportation Needs: No Transportation Needs (03/31/2023)   PRAPARE - Administrator, Civil Service (Medical): No    Lack of Transportation (Non-Medical): No  Physical Activity: Sufficiently Active (03/31/2023)   Exercise Vital Sign    Days of Exercise per Week: 5 days    Minutes of Exercise per Session: 60 min  Stress: No Stress Concern Present (03/31/2023)   [employer] of Occupational Health - Occupational Stress Questionnaire    Feeling of Stress : Not at all  Social Connections: Socially Integrated (03/31/2023)   Social Connection and Isolation Panel [NHANES]    Frequency of Communication with Friends and Family: More than three times a week    Frequency of Social Gatherings with Friends and Family: More than three times a week    Attends Religious Services: More than 4 times per year    Active Member of [employer] or Organizations: Yes    Attends Engineer, structural: More than 4 times per year    Marital Status: Married    Objective:  There were no vitals taken for this visit.     03/31/2023   10:33 AM 01/16/2023    7:40 AM 12/18/2022    9:18 AM  BP/Weight  Systolic BP -- 130 164  Diastolic BP -- 74 96  Wt. (Lbs) 186 186   BMI 26.69 kg/m2 26.69 kg/m2     Physical Exam  Diabetic Foot Exam - Simple   No data filed      Lab Results  Component Value Date   WBC 7.0 12/18/2022   HGB 13.9 12/18/2022   HCT 42.1 12/18/2022   PLT 369 12/18/2022   GLUCOSE 98 12/18/2022   CHOL 290 (H)  12/18/2022   TRIG 204 (H) 12/18/2022   HDL 62 12/18/2022  LDLCALC 190 (H) 12/18/2022   ALT 18 12/18/2022   AST 23 12/18/2022   NA 140 12/18/2022   K 5.4 (H) 12/18/2022   CL 100 12/18/2022   CREATININE 0.96 12/18/2022   BUN 18 12/18/2022   CO2 21 12/18/2022   TSH 2.530 12/18/2022   INR 0.91 02/28/2016   HGBA1C 6.0 (H) 12/18/2022      Assessment & Plan:    Mixed hyperlipidemia  Essential hypertension, benign  B12 deficiency  Prediabetes     No orders of the defined types were placed in this encounter.   No orders of the defined types were placed in this encounter.    Follow-up: No follow-ups on file.   I,Kasara Schomer I Leal-Borjas,acting as a scribe for [employer], CMA.,have documented all relevant documentation on the behalf of Vicente Weidler I Leal-Borjas, CMA,as directed by  Leia Alf I Leal-Borjas, CMA while in the presence of Airanna Partin I Leal-Borjas, CMA.   An After Visit Summary was printed and given to the patient.  Eugenie Norrie, CMA [facility] [phone]

## 2023-04-19 NOTE — Progress Notes (Unsigned)
Subjective:  Patient ID: Susan Roberson, female    DOB: Oct 26, 1950  Age: 72 y.o. MRN: [id number]  No chief complaint on file.   HPI   Susan Roberson is a 72 year old Caucasian female that presents for follow-up of hypertension and hyperlipidemia.   Hypertension, follow-up:  She was last seen for hypertension 3 months ago.  BP at that visit was  Management since that visit includes benazepril 20 mg and HCTZ 25 mg.  She reports excellent compliance with treatment. She is not having side effects.  She is following a Regular diet. She is not exercising. She does not smoke.  Use of agents associated with hypertension:HCTZ and Benazepril.   Lipid/Cholesterol, Follow-up   She was last seen for this 3 months ago.  Management since that visit includes atorvastatin  She reports excellent compliance with treatment. She is not having side effects.      03/31/2023   10:38 AM 01/16/2023    7:50 AM 01/17/2022   10:06 AM 09/12/2021    7:43 AM 07/02/2020    9:19 AM  Depression screen PHQ 2/9  Decreased Interest 0 0 0 0 0  Down, Depressed, Hopeless 0 0 0 0 0  PHQ - 2 Score 0 0 0 0 0  Altered sleeping  0   0  Tired, decreased energy  0   0  Change in appetite  0   0  Feeling bad or failure about yourself   0   0  Trouble concentrating  0   0  Moving slowly or fidgety/restless  0   0  Suicidal thoughts  0   0  PHQ-9 Score  0   0  Difficult doing work/chores  Not difficult at all           03/31/2023   10:40 AM  Fall Risk   Falls in the past year? 0  Number falls in past yr: 0  Injury with Fall? 0  Risk for fall due to : No Fall Risks  Follow up Falls prevention discussed    Patient Care Team: Langley Gauss, [place] as PCP - General (Physician Assistant)   Review of Systems  Current Outpatient Medications on File Prior to Visit  Medication Sig Dispense Refill   atorvastatin (LIPITOR) 40 MG tablet Take 1 tablet (40 mg total) by mouth daily. 90 tablet 0   benazepril (LOTENSIN) 40 MG tablet  Take 1 tablet (40 mg total) by mouth daily. 90 tablet 3   celecoxib (CELEBREX) 100 MG capsule Take 1 capsule (100 mg total) by mouth daily. 90 capsule 0   cyanocobalamin (VITAMIN B12) 1000 MCG tablet Take 1 tablet (1,000 mcg total) by mouth daily. 90 tablet 0   hydrochlorothiazide (HYDRODIURIL) 25 MG tablet Take 1 tablet by mouth daily. 90 tablet 0   pramipexole (MIRAPEX) 0.25 MG tablet Take 1 tablet (0.25 mg total) by mouth at bedtime. 90 tablet 2   Vitamin D, Ergocalciferol, (DRISDOL) 1.25 MG (50000 UNIT) CAPS capsule Take 1 capsule (50,000 Units total) by mouth every 7 (seven) days. 5 capsule 2   No current facility-administered medications on file prior to visit.   Past Medical History:  Diagnosis Date   Arthritis    Cancer (HCC)    pre cancerous skin cancer with multiple lesions removed   Complication of anesthesia    broken teeth   Essential (primary) hypertension    Generalized anxiety disorder    Hyperkalemia    Hypertension    Past Surgical History:  Procedure  Laterality Date   SKIN CANCER EXCISION Bilateral    legs   TOTAL HIP ARTHROPLASTY Right    5 yrs ago   TOTAL Roberson ARTHROPLASTY Right 03/10/2016   Procedure: RIGHT TOTAL Roberson ARTHROPLASTY;  Surgeon: Dannielle Huh, MD;  Location: [facility] OR;  Service: Orthopedics;  Laterality: Right;    Family History  Problem Relation Age of Onset   Stroke Father    Cerebrovascular Accident Other    Diabetes Mellitus II Other    Hyperlipidemia Other    Hypertension Other    Social History   Socioeconomic History   Marital status: Married    Spouse name: Not on file   Number of children: Not on file   Years of education: Not on file   Highest education level: Not on file  Occupational History   Not on file  Tobacco Use   Smoking status: Never   Smokeless tobacco: Never  Vaping Use   Vaping status: Never Used  Substance and Sexual Activity   Alcohol use: Yes    Comment: Drinks on a social basis.   Drug use: No   Sexual  activity: Not on file  Other Topics Concern   Not on file  Social History Narrative   Not on file   Social Determinants of Health   Financial Resource Strain: Low Risk  (03/31/2023)   Overall Financial Resource Strain (CARDIA)    Difficulty of Paying Living Expenses: Not hard at all  Food Insecurity: No Food Insecurity (03/31/2023)   Hunger Vital Sign    Worried About Running Out of Food in the Last Year: Never true    Ran Out of Food in the Last Year: Never true  Transportation Needs: No Transportation Needs (03/31/2023)   PRAPARE - Administrator, Civil Service (Medical): No    Lack of Transportation (Non-Medical): No  Physical Activity: Sufficiently Active (03/31/2023)   Exercise Vital Sign    Days of Exercise per Week: 5 days    Minutes of Exercise per Session: 60 min  Stress: No Stress Concern Present (03/31/2023)   [employer] of Occupational Health - Occupational Stress Questionnaire    Feeling of Stress : Not at all  Social Connections: Socially Integrated (03/31/2023)   Social Connection and Isolation Panel [NHANES]    Frequency of Communication with Friends and Family: More than three times a week    Frequency of Social Gatherings with Friends and Family: More than three times a week    Attends Religious Services: More than 4 times per year    Active Member of [employer] or Organizations: Yes    Attends Engineer, structural: More than 4 times per year    Marital Status: Married    Objective:  There were no vitals taken for this visit.     03/31/2023   10:33 AM 01/16/2023    7:40 AM 12/18/2022    9:18 AM  BP/Weight  Systolic BP -- 130 164  Diastolic BP -- 74 96  Wt. (Lbs) 186 186   BMI 26.69 kg/m2 26.69 kg/m2     Physical Exam  Diabetic Foot Exam - Simple   No data filed      Lab Results  Component Value Date   WBC 7.0 12/18/2022   HGB 13.9 12/18/2022   HCT 42.1 12/18/2022   PLT 369 12/18/2022   GLUCOSE 98 12/18/2022   CHOL 290 (H)  12/18/2022   TRIG 204 (H) 12/18/2022   HDL 62 12/18/2022  LDLCALC 190 (H) 12/18/2022   ALT 18 12/18/2022   AST 23 12/18/2022   NA 140 12/18/2022   K 5.4 (H) 12/18/2022   CL 100 12/18/2022   CREATININE 0.96 12/18/2022   BUN 18 12/18/2022   CO2 21 12/18/2022   TSH 2.530 12/18/2022   INR 0.91 02/28/2016   HGBA1C 6.0 (H) 12/18/2022      Assessment & Plan:    There are no diagnoses linked to this encounter.   No orders of the defined types were placed in this encounter.   No orders of the defined types were placed in this encounter.    Follow-up: No follow-ups on file.   I,Marla I Leal-Borjas,acting as a scribe for [employer], PA.,have documented all relevant documentation on the behalf of Langley Gauss, PA,as directed by  Langley Gauss, PA while in the presence of Langley Gauss, [place].   An After Visit Summary was printed and given to the patient.  Langley Gauss, [place] [facility] [phone]

## 2023-04-21 ENCOUNTER — Encounter: Payer: Self-pay | Admitting: Physician Assistant

## 2023-04-21 ENCOUNTER — Ambulatory Visit (INDEPENDENT_AMBULATORY_CARE_PROVIDER_SITE_OTHER): Payer: Medicare Other | Admitting: Physician Assistant

## 2023-04-21 VITALS — BP 130/82 | HR 76 | Temp 97.3°F | Ht 70.0 in | Wt 186.8 lb

## 2023-04-21 DIAGNOSIS — E782 Mixed hyperlipidemia: Secondary | ICD-10-CM | POA: Diagnosis not present

## 2023-04-21 DIAGNOSIS — E538 Deficiency of other specified B group vitamins: Secondary | ICD-10-CM | POA: Diagnosis not present

## 2023-04-21 DIAGNOSIS — R7303 Prediabetes: Secondary | ICD-10-CM

## 2023-04-21 DIAGNOSIS — R29898 Other symptoms and signs involving the musculoskeletal system: Secondary | ICD-10-CM

## 2023-04-21 DIAGNOSIS — I10 Essential (primary) hypertension: Secondary | ICD-10-CM | POA: Diagnosis not present

## 2023-04-21 MED ORDER — ATORVASTATIN CALCIUM 40 MG PO TABS
40.0000 mg | ORAL_TABLET | Freq: Every day | ORAL | 0 refills | Status: DC
Start: 2023-04-21 — End: 2023-07-30

## 2023-04-21 NOTE — Assessment & Plan Note (Signed)
Well controlled.  Continue to work on eating a healthy diet and exercise.  Labs drawn today.   No major side effects reported, and no issues with compliance. The current medical regimen is effective;  continue present plan with Lotensin 40mg , hydrochlorothiazide 25mg  Will adjust medication as needed depending on labs BP Readings from Last 3 Encounters:  04/21/23 130/82  01/16/23 130/74  12/18/22 (!) 164/96

## 2023-04-21 NOTE — Assessment & Plan Note (Signed)
Well controlled.  Continue to work on eating a healthy diet and exercise.  Labs drawn today.   No major side effects reported, and no issues with compliance. The current medical regimen is effective;  continue present plan with Lipitor Will adjust medication as needed depending on labs Lab Results  Component Value Date   LDLCALC 190 (H) 12/18/2022

## 2023-04-21 NOTE — Assessment & Plan Note (Signed)
Labs drawn today Will adjust treatment depending on results 

## 2023-04-21 NOTE — Assessment & Plan Note (Signed)
Physical therapy has helped a lot Continues to try at home exercises Will continue to work on strength training at home

## 2023-04-22 LAB — CBC WITH DIFFERENTIAL/PLATELET
Basophils Absolute: 0.1 10*3/uL (ref 0.0–0.2)
Basos: 1 %
EOS (ABSOLUTE): 0.4 10*3/uL (ref 0.0–0.4)
Eos: 5 %
Hematocrit: 40.8 % (ref 34.0–46.6)
Hemoglobin: 13.3 g/dL (ref 11.1–15.9)
Immature Grans (Abs): 0 10*3/uL (ref 0.0–0.1)
Immature Granulocytes: 0 %
Lymphocytes Absolute: 2.1 10*3/uL (ref 0.7–3.1)
Lymphs: 27 %
MCH: 28.7 pg (ref 26.6–33.0)
MCHC: 32.6 g/dL (ref 31.5–35.7)
MCV: 88 fL (ref 79–97)
Monocytes Absolute: 0.7 10*3/uL (ref 0.1–0.9)
Monocytes: 9 %
Neutrophils Absolute: 4.4 10*3/uL (ref 1.4–7.0)
Neutrophils: 58 %
Platelets: 354 10*3/uL (ref 150–450)
RBC: 4.64 x10E6/uL (ref 3.77–5.28)
RDW: 12.6 % (ref 11.7–15.4)
WBC: 7.6 10*3/uL (ref 3.4–10.8)

## 2023-04-22 LAB — COMPREHENSIVE METABOLIC PANEL
ALT: 17 IU/L (ref 0–32)
AST: 21 IU/L (ref 0–40)
Albumin: 4.4 g/dL (ref 3.8–4.8)
Alkaline Phosphatase: 115 IU/L (ref 44–121)
BUN/Creatinine Ratio: 25 (ref 12–28)
BUN: 32 mg/dL — ABNORMAL HIGH (ref 8–27)
Bilirubin Total: 0.3 mg/dL (ref 0.0–1.2)
CO2: 24 mmol/L (ref 20–29)
Calcium: 10.1 mg/dL (ref 8.7–10.3)
Chloride: 99 mmol/L (ref 96–106)
Creatinine, Ser: 1.27 mg/dL — ABNORMAL HIGH (ref 0.57–1.00)
Globulin, Total: 2.8 g/dL (ref 1.5–4.5)
Glucose: 102 mg/dL — ABNORMAL HIGH (ref 70–99)
Potassium: 5.7 mmol/L — ABNORMAL HIGH (ref 3.5–5.2)
Sodium: 140 mmol/L (ref 134–144)
Total Protein: 7.2 g/dL (ref 6.0–8.5)
eGFR: 44 mL/min/{1.73_m2} — ABNORMAL LOW (ref >59)

## 2023-04-22 LAB — LIPID PANEL
Chol/HDL Ratio: 3.6 ratio (ref 0.0–4.4)
Cholesterol, Total: 209 mg/dL — ABNORMAL HIGH (ref 100–199)
HDL: 58 mg/dL (ref >39)
LDL Chol Calc (NIH): 120 mg/dL — ABNORMAL HIGH (ref 0–99)
Triglycerides: 175 mg/dL — ABNORMAL HIGH (ref 0–149)
VLDL Cholesterol Cal: 31 mg/dL (ref 5–40)

## 2023-04-22 LAB — VITAMIN B12: Vitamin B-12: 1108 pg/mL (ref 232–1245)

## 2023-04-22 LAB — HEMOGLOBIN A1C
Est. average glucose Bld gHb Est-mCnc: 131 mg/dL
Hgb A1c MFr Bld: 6.2 % — ABNORMAL HIGH (ref 4.8–5.6)

## 2023-04-22 LAB — LITHOLINK CKD PROGRAM

## 2023-04-24 ENCOUNTER — Other Ambulatory Visit: Payer: Self-pay | Admitting: Physician Assistant

## 2023-04-24 ENCOUNTER — Other Ambulatory Visit: Payer: Self-pay

## 2023-04-24 DIAGNOSIS — R29898 Other symptoms and signs involving the musculoskeletal system: Secondary | ICD-10-CM

## 2023-04-24 DIAGNOSIS — I10 Essential (primary) hypertension: Secondary | ICD-10-CM

## 2023-04-24 DIAGNOSIS — N289 Disorder of kidney and ureter, unspecified: Secondary | ICD-10-CM

## 2023-04-24 MED ORDER — LOSARTAN POTASSIUM 50 MG PO TABS: 50.0000 mg | ORAL_TABLET | Freq: Every day | ORAL | 0 refills | Status: DC

## 2023-04-28 ENCOUNTER — Telehealth: Payer: Self-pay

## 2023-04-28 ENCOUNTER — Encounter: Payer: Self-pay | Admitting: Physician Assistant

## 2023-04-28 ENCOUNTER — Ambulatory Visit: Payer: Medicare Other | Admitting: Physician Assistant

## 2023-04-28 VITALS — BP 190/100 | HR 73 | Temp 96.9°F | Ht 70.0 in | Wt 191.4 lb

## 2023-04-28 DIAGNOSIS — N3941 Urge incontinence: Secondary | ICD-10-CM

## 2023-04-28 DIAGNOSIS — R3589 Other polyuria: Secondary | ICD-10-CM

## 2023-04-28 DIAGNOSIS — I10 Essential (primary) hypertension: Secondary | ICD-10-CM

## 2023-04-28 HISTORY — DX: Urge incontinence: N39.41

## 2023-04-28 HISTORY — DX: Other polyuria: R35.89

## 2023-04-28 LAB — POCT URINALYSIS DIP (CLINITEK)
Bilirubin, UA: NEGATIVE
Blood, UA: NEGATIVE
Glucose, UA: NEGATIVE mg/dL
Ketones, POC UA: NEGATIVE mg/dL
Leukocytes, UA: NEGATIVE
Nitrite, UA: NEGATIVE
POC PROTEIN,UA: NEGATIVE
Spec Grav, UA: 1.01 (ref 1.010–1.025)
Urobilinogen, UA: 0.2 U/dL
pH, UA: 6 (ref 5.0–8.0)

## 2023-04-28 MED ORDER — OXYBUTYNIN CHLORIDE ER 5 MG PO TB24
5.0000 mg | ORAL_TABLET | Freq: Every day | ORAL | 0 refills | Status: DC
Start: 2023-04-28 — End: 2023-06-26

## 2023-04-28 MED ORDER — LOSARTAN POTASSIUM 100 MG PO TABS: 100.0000 mg | ORAL_TABLET | Freq: Every day | ORAL | 0 refills | Status: DC

## 2023-04-28 NOTE — Addendum Note (Signed)
Addended by: Gwynneth [employer]iment [employer] on: 04/28/2023 02:00 PM   Modules accepted: Orders

## 2023-04-28 NOTE — Assessment & Plan Note (Signed)
Uncontrolled Increased Losartan to 100mg  Continue checking bp daily Will call later this week to update on bp Discussed symptoms to watch for and go to ER.

## 2023-04-28 NOTE — Progress Notes (Signed)
Subjective:  Patient ID: Susan Roberson, female    DOB: [birth year]  Age: 72 y.o. MRN: [id number]  Chief Complaint  Patient presents with   Hypertension    HPI  She was last seen for hypertension 1 weeks ago.  BP at that visit was 130/82. Management includes Losartan 50 mg daily. Patient stated she took her blood pressure before going to the gym this morning and it was 223/117 called the office was told to go somewhere and have it recheck and take a losartan to help it go down, went to [facility] and they got 226/106 and told her see needed to come see her doctor. Patient denies any chest pain, SOB, tingling of the arm, headaches, or vision changes.   She reports excellent compliance with treatment. She is not having side effects.  She is following a Regular diet. She is exercising. She does not smoke.  Use of agents associated with hypertension: none.   Outside blood pressures are 220/110.  Patient also states that she has had an increase in urinary frequency. States she has to go to the bathroom about 4-6 times with in an hour and then will not have to go for awhile and it will happen again. Denies any burning, stinging, or discoloration. Admits to drinking lots of fluids regularly. Discussed monitoring fluid intake and discontinuing fluids a few hours before bedtime.   Pertinent labs: Lab Results  Component Value Date   CHOL 209 (H) 04/21/2023   HDL 58 04/21/2023   LDLCALC 120 (H) 04/21/2023   TRIG 175 (H) 04/21/2023   CHOLHDL 3.6 04/21/2023   Lab Results  Component Value Date   NA 140 04/21/2023   K 5.7 (H) 04/21/2023   CREATININE 1.27 (H) 04/21/2023   EGFR 44 (L) 04/21/2023   GLUCOSE 102 (H) 04/21/2023     The 10-year ASCVD risk score (Arnett DK, et al., 2019) is: 37.6%        04/21/2023    7:38 AM 03/31/2023   10:38 AM 01/16/2023    7:50 AM 01/17/2022   10:06 AM 09/12/2021    7:43 AM  Depression screen PHQ 2/9  Decreased Interest 0 0 0 0 0  Down, Depressed, Hopeless  0 0 0 0 0  PHQ - 2 Score 0 0 0 0 0  Altered sleeping 1  0    Tired, decreased energy 0  0    Change in appetite 0  0    Feeling bad or failure about yourself  0  0    Trouble concentrating 0  0    Moving slowly or fidgety/restless 0  0    Suicidal thoughts 0  0    PHQ-9 Score 1  0    Difficult doing work/chores Not difficult at all  Not difficult at all          04/21/2023    7:38 AM  Fall Risk   Falls in the past year? 0  Number falls in past yr: 0  Injury with Fall? 0  Risk for fall due to : No Fall Risks  Follow up Falls evaluation completed    Patient Care Team: Langley Gauss, [place] as PCP - General (Physician Assistant)   Review of Systems  Constitutional:  Negative for chills, fatigue and fever.  HENT:  Negative for congestion, ear pain and sore throat.   Respiratory:  Negative for cough and shortness of breath.   Cardiovascular:  Negative for chest pain and palpitations.  Gastrointestinal:  Negative for abdominal pain, constipation, diarrhea, nausea and vomiting.  Genitourinary:  Negative for difficulty urinating and dysuria.  Musculoskeletal:  Negative for arthralgias, back pain and myalgias.  Skin:  Negative for rash.  Neurological:  Negative for dizziness and headaches.  Psychiatric/Behavioral:  Negative for dysphoric mood.     Current Outpatient Medications on File Prior to Visit  Medication Sig Dispense Refill   atorvastatin (LIPITOR) 40 MG tablet Take 1 tablet (40 mg total) by mouth daily. 90 tablet 0   celecoxib (CELEBREX) 100 MG capsule Take 1 capsule (100 mg total) by mouth daily. 90 capsule 0   cyanocobalamin (VITAMIN B12) 1000 MCG tablet Take 1 tablet (1,000 mcg total) by mouth daily. 90 tablet 0   pramipexole (MIRAPEX) 0.25 MG tablet Take 1 tablet (0.25 mg total) by mouth at bedtime. 90 tablet 2   Vitamin D, Ergocalciferol, (DRISDOL) 1.25 MG (50000 UNIT) CAPS capsule Take 1 capsule (50,000 Units total) by mouth every 7 (seven) days. 5 capsule 2   No current  facility-administered medications on file prior to visit.   Past Medical History:  Diagnosis Date   Arthritis    Cancer (HCC)    pre cancerous skin cancer with multiple lesions removed   Complication of anesthesia    broken teeth   Essential (primary) hypertension    Generalized anxiety disorder    Hyperkalemia    Hypertension    Past Surgical History:  Procedure Laterality Date   SKIN CANCER EXCISION Bilateral    legs   TOTAL HIP ARTHROPLASTY Right    5 yrs ago   TOTAL KNEE ARTHROPLASTY Right 03/10/2016   Procedure: RIGHT TOTAL KNEE ARTHROPLASTY;  Surgeon: Dannielle Huh, MD;  Location: [facility] OR;  Service: Orthopedics;  Laterality: Right;    Family History  Problem Relation Age of Onset   Stroke Father    Cerebrovascular Accident Other    Diabetes Mellitus II Other    Hyperlipidemia Other    Hypertension Other    Social History   Socioeconomic History   Marital status: Married    Spouse name: Not on file   Number of children: Not on file   Years of education: Not on file   Highest education level: Not on file  Occupational History   Not on file  Tobacco Use   Smoking status: Never   Smokeless tobacco: Never  Vaping Use   Vaping status: Never Used  Substance and Sexual Activity   Alcohol use: Yes    Comment: Drinks on a social basis.   Drug use: No   Sexual activity: Not on file  Other Topics Concern   Not on file  Social History Narrative   Not on file   Social Determinants of Health   Financial Resource Strain: Low Risk  (03/31/2023)   Overall Financial Resource Strain (CARDIA)    Difficulty of Paying Living Expenses: Not hard at all  Food Insecurity: No Food Insecurity (03/31/2023)   Hunger Vital Sign    Worried About Running Out of Food in the Last Year: Never true    Ran Out of Food in the Last Year: Never true  Transportation Needs: No Transportation Needs (03/31/2023)   PRAPARE - Administrator, Civil Service (Medical): No    Lack of  Transportation (Non-Medical): No  Physical Activity: Sufficiently Active (03/31/2023)   Exercise Vital Sign    Days of Exercise per Week: 5 days    Minutes of Exercise per Session: 60 min  Stress: No  Stress Concern Present (03/31/2023)   [employer] of Occupational Health - Occupational Stress Questionnaire    Feeling of Stress : Not at all  Social Connections: Socially Integrated (03/31/2023)   Social Connection and Isolation Panel [NHANES]    Frequency of Communication with Friends and Family: More than three times a week    Frequency of Social Gatherings with Friends and Family: More than three times a week    Attends Religious Services: More than 4 times per year    Active Member of [employer] or Organizations: Yes    Attends Engineer, structural: More than 4 times per year    Marital Status: Married    Objective:  BP (!) 190/100 (BP Location: Left Arm, Patient Position: Sitting, Cuff Size: Large)   Pulse 73   Temp (!) 96.9 F (36.1 C) (Temporal)   Ht 5\' 10"  (1.778 m)   Wt 191 lb 6.4 oz (86.8 kg)   SpO2 99%   BMI 27.46 kg/m      04/28/2023   10:25 AM 04/28/2023    9:58 AM 04/21/2023    7:25 AM  BP/Weight  Systolic BP 190 220 130  Diastolic BP 100 110 82  Wt. (Lbs)  191.4 186.8  BMI  27.46 kg/m2 26.8 kg/m2    Physical Exam Vitals reviewed.  Constitutional:      Appearance: Normal appearance.  Cardiovascular:     Rate and Rhythm: Normal rate and regular rhythm.     Heart sounds: Normal heart sounds.  Pulmonary:     Effort: Pulmonary effort is normal.     Breath sounds: Normal breath sounds.  Abdominal:     General: Bowel sounds are normal.     Palpations: Abdomen is soft.     Tenderness: There is no abdominal tenderness.  Neurological:     Mental Status: She is alert and oriented to person, place, and time.  Psychiatric:        Mood and Affect: Mood normal.        Behavior: Behavior normal.     Diabetic Foot Exam - Simple   No data filed       Lab Results  Component Value Date   WBC 7.6 04/21/2023   HGB 13.3 04/21/2023   HCT 40.8 04/21/2023   PLT 354 04/21/2023   GLUCOSE 102 (H) 04/21/2023   CHOL 209 (H) 04/21/2023   TRIG 175 (H) 04/21/2023   HDL 58 04/21/2023   LDLCALC 120 (H) 04/21/2023   ALT 17 04/21/2023   AST 21 04/21/2023   NA 140 04/21/2023   K 5.7 (H) 04/21/2023   CL 99 04/21/2023   CREATININE 1.27 (H) 04/21/2023   BUN 32 (H) 04/21/2023   CO2 24 04/21/2023   TSH 2.530 12/18/2022   INR 0.91 02/28/2016   HGBA1C 6.2 (H) 04/21/2023      Assessment & Plan:    Polyuria Assessment & Plan: UA negative Prescribed oxybutynin 5mg  Will continue to monitor symptoms  Orders: -     POCT URINALYSIS DIP (CLINITEK) -     oxyBUTYnin Chloride ER; Take 1 tablet (5 mg total) by mouth at bedtime.  Dispense: 30 tablet; Refill: 0  Essential hypertension, benign Assessment & Plan: Uncontrolled Increased Losartan to 100mg  Continue checking bp daily Will call later this week to update on bp Discussed symptoms to watch for and go to ER.   Orders: -     Losartan Potassium; Take 1 tablet (100 mg total) by mouth daily.  Dispense: 30  tablet; Refill: 0  Urge incontinence Assessment & Plan: Prescribed oxybutynin 5mg  Will continue to monitor symptoms      Meds ordered this encounter  Medications   losartan (COZAAR) 100 MG tablet    Sig: Take 1 tablet (100 mg total) by mouth daily.    Dispense:  30 tablet    Refill:  0   oxybutynin (DITROPAN-XL) 5 MG 24 hr tablet    Sig: Take 1 tablet (5 mg total) by mouth at bedtime.    Dispense:  30 tablet    Refill:  0    Orders Placed This Encounter  Procedures   POCT URINALYSIS DIP (CLINITEK)     Follow-up: No follow-ups on file.   I,Jacqua L Marsh,acting as a scribe for [employer], PA.,have documented all relevant documentation on the behalf of Langley Gauss, PA,as directed by  Langley Gauss, PA while in the presence of Langley Gauss, [place].   An After Visit Summary was  printed and given to the patient.  Langley Gauss, [place] [facility] [phone]

## 2023-04-28 NOTE — Assessment & Plan Note (Signed)
Prescribed oxybutynin 5mg  Will continue to monitor symptoms

## 2023-04-28 NOTE — Assessment & Plan Note (Signed)
UA negative Prescribed oxybutynin 5mg  Will continue to monitor symptoms

## 2023-04-28 NOTE — Telephone Encounter (Signed)
Patient called to report high blood pressure readings.  After abnormal kidney function on 04/21/23 labs hydrochlorothiazide and Benzapril were stopped and Losartan 50 mg was started.  Patient has been monitoring her BP at home and readings have been increasing daily with systolic readings going from 143 to 164 to 198 to a BP of 223/117 today however she feels fine.  She also reports that since changing medications she is urinating a lot more.  She denies any noticeable fluid retention at this time.  She did go ahead and take another Losartan 50 mg per your direction today.  Please advise.  Lab Results  Component Value Date   NA 140 04/21/2023   CL 99 04/21/2023   K 5.7 (H) 04/21/2023   CO2 24 04/21/2023   BUN 32 (H) 04/21/2023   CREATININE 1.27 (H) 04/21/2023   EGFR 44 (L) 04/21/2023   CALCIUM 10.1 04/21/2023   ALBUMIN 4.4 04/21/2023   GLUCOSE 102 (H) 04/21/2023

## 2023-05-05 ENCOUNTER — Ambulatory Visit: Payer: Medicare Other | Admitting: Physician Assistant

## 2023-05-05 VITALS — BP 230/100 | HR 99 | Temp 97.3°F | Resp 14 | Ht 70.0 in | Wt 192.0 lb

## 2023-05-05 DIAGNOSIS — I161 Hypertensive emergency: Secondary | ICD-10-CM

## 2023-05-05 NOTE — Progress Notes (Signed)
Subjective:  Patient ID: Susan Roberson, female    DOB: [birth year]  Age: 72 y.o. MRN: [id number]  Chief Complaint  Patient presents with   Hypertension   vission loss    HPI   Patient is here for high blood pressure. He has been going to [facility] pharmacy to check her blood pressure after her blood pressure was changed due to kidney failure. She is taking losartan 100 mg daily. Her blood pressure has been 184/118,  175/133, 181/128, 190/94, 186/120. Last blood pressure was 220/101.  She noticed blurred vision on right eye and some headache. This started back on Friday, she denies any facial weakness, numbness, tingling, facial drooping. Discussed with the patient since he BP is still running so high and she is now having vision changes that she needs to go to the ER to not only have her BP better controlled but also have an pathobiologists look at her eye today.  She denied chest pain, SOB.     05/12/2023    7:45 AM 04/21/2023    7:38 AM 03/31/2023   10:38 AM 01/16/2023    7:50 AM 01/17/2022   10:06 AM  Depression screen PHQ 2/9  Decreased Interest 0 0 0 0 0  Down, Depressed, Hopeless 0 0 0 0 0  PHQ - 2 Score 0 0 0 0 0  Altered sleeping 1 1  0   Tired, decreased energy 0 0  0   Change in appetite 0 0  0   Feeling bad or failure about yourself  0 0  0   Trouble concentrating 0 0  0   Moving slowly or fidgety/restless 0 0  0   Suicidal thoughts 0 0  0   PHQ-9 Score 1 1  0   Difficult doing work/chores Not difficult at all Not difficult at all  Not difficult at all         04/21/2023    7:38 AM  Fall Risk   Falls in the past year? 0  Number falls in past yr: 0  Injury with Fall? 0  Risk for fall due to : No Fall Risks  Follow up Falls evaluation completed    Patient Care Team: Langley Gauss, [place] as PCP - General (Physician Assistant)   Review of Systems  Constitutional:  Negative for chills, fatigue and fever.  HENT:  Negative for congestion, ear pain and sore throat.    Eyes:  Positive for visual disturbance (right eye).  Respiratory:  Negative for cough and shortness of breath.   Cardiovascular:  Negative for chest pain and palpitations.  Gastrointestinal:  Negative for abdominal pain, constipation, diarrhea, nausea and vomiting.  Endocrine: Negative for polydipsia, polyphagia and polyuria.  Genitourinary:  Negative for difficulty urinating and dysuria.  Musculoskeletal:  Negative for arthralgias, back pain and myalgias.  Skin:  Negative for rash.  Neurological:  Positive for headaches.  Psychiatric/Behavioral:  Negative for dysphoric mood. The patient is not nervous/anxious.     Current Outpatient Medications on File Prior to Visit  Medication Sig Dispense Refill   atorvastatin (LIPITOR) 40 MG tablet Take 1 tablet (40 mg total) by mouth daily. 90 tablet 0   celecoxib (CELEBREX) 100 MG capsule Take 1 capsule (100 mg total) by mouth daily. 90 capsule 0   cyanocobalamin (VITAMIN B12) 1000 MCG tablet Take 1 tablet (1,000 mcg total) by mouth daily. 90 tablet 0   losartan (COZAAR) 100 MG tablet Take 1 tablet (100 mg total) by mouth daily.  30 tablet 0   oxybutynin (DITROPAN-XL) 5 MG 24 hr tablet Take 1 tablet (5 mg total) by mouth at bedtime. 30 tablet 0   pramipexole (MIRAPEX) 0.25 MG tablet Take 1 tablet (0.25 mg total) by mouth at bedtime. 90 tablet 2   Vitamin D, Ergocalciferol, (DRISDOL) 1.25 MG (50000 UNIT) CAPS capsule Take 1 capsule (50,000 Units total) by mouth every 7 (seven) days. 5 capsule 2   No current facility-administered medications on file prior to visit.   Past Medical History:  Diagnosis Date   Arthritis    Cancer (HCC)    pre cancerous skin cancer with multiple lesions removed   Complication of anesthesia    broken teeth   Essential (primary) hypertension    Generalized anxiety disorder    Hyperkalemia    Hypertension    Past Surgical History:  Procedure Laterality Date   SKIN CANCER EXCISION Bilateral    legs   TOTAL HIP  ARTHROPLASTY Right    5 yrs ago   TOTAL KNEE ARTHROPLASTY Right 03/10/2016   Procedure: RIGHT TOTAL KNEE ARTHROPLASTY;  Surgeon: Dannielle Huh, MD;  Location: [facility] OR;  Service: Orthopedics;  Laterality: Right;    Family History  Problem Relation Age of Onset   Stroke Father    Cerebrovascular Accident Other    Diabetes Mellitus II Other    Hyperlipidemia Other    Hypertension Other    Social History   Socioeconomic History   Marital status: Married    Spouse name: Not on file   Number of children: Not on file   Years of education: Not on file   Highest education level: Not on file  Occupational History   Not on file  Tobacco Use   Smoking status: Never   Smokeless tobacco: Never  Vaping Use   Vaping status: Never Used  Substance and Sexual Activity   Alcohol use: Yes    Comment: Drinks on a social basis.   Drug use: No   Sexual activity: Not on file  Other Topics Concern   Not on file  Social History Narrative   Not on file   Social Determinants of Health   Financial Resource Strain: Low Risk  (03/31/2023)   Overall Financial Resource Strain (CARDIA)    Difficulty of Paying Living Expenses: Not hard at all  Food Insecurity: No Food Insecurity (03/31/2023)   Hunger Vital Sign    Worried About Running Out of Food in the Last Year: Never true    Ran Out of Food in the Last Year: Never true  Transportation Needs: No Transportation Needs (03/31/2023)   PRAPARE - Administrator, Civil Service (Medical): No    Lack of Transportation (Non-Medical): No  Physical Activity: Sufficiently Active (03/31/2023)   Exercise Vital Sign    Days of Exercise per Week: 5 days    Minutes of Exercise per Session: 60 min  Stress: No Stress Concern Present (03/31/2023)   [employer] of Occupational Health - Occupational Stress Questionnaire    Feeling of Stress : Not at all  Social Connections: Socially Integrated (03/31/2023)   Social Connection and Isolation Panel [NHANES]     Frequency of Communication with Friends and Family: More than three times a week    Frequency of Social Gatherings with Friends and Family: More than three times a week    Attends Religious Services: More than 4 times per year    Active Member of [employer] or Organizations: Yes    Attends [employer]  or Organization Meetings: More than 4 times per year    Marital Status: Married    Objective:  BP (!) 230/100   Pulse 99   Temp (!) 97.3 F (36.3 C)   Resp 14   Ht 5\' 10"  (1.778 m)   Wt 192 lb (87.1 kg)   SpO2 (!) 68%   BMI 27.55 kg/m      05/12/2023    7:31 AM 05/07/2023    8:28 AM 05/07/2023    8:00 AM  BP/Weight  Systolic BP 112 142 162  Diastolic BP 58 78 82  Wt. (Lbs) 193.6    BMI 27.78 kg/m2      Physical Exam Vitals reviewed.  Constitutional:      Appearance: Normal appearance.  Eyes:     General: Visual field deficit present. No scleral icterus.       Right eye: No foreign body.     Extraocular Movements:     Right eye: No nystagmus.     Left eye: No nystagmus.     Conjunctiva/sclera:     Right eye: Right conjunctiva is not injected.     Left eye: Left conjunctiva is not injected.  Cardiovascular:     Rate and Rhythm: Normal rate and regular rhythm.     Heart sounds: Normal heart sounds.  Pulmonary:     Effort: Pulmonary effort is normal.     Breath sounds: Normal breath sounds.  Abdominal:     General: Bowel sounds are normal.     Palpations: Abdomen is soft.     Tenderness: There is no abdominal tenderness.  Neurological:     Mental Status: She is alert and oriented to person, place, and time.  Psychiatric:        Mood and Affect: Mood normal.        Behavior: Behavior normal.     Diabetic Foot Exam - Simple   No data filed      Lab Results  Component Value Date   WBC 7.6 04/21/2023   HGB 13.3 04/21/2023   HCT 40.8 04/21/2023   PLT 354 04/21/2023   GLUCOSE 102 (H) 04/21/2023   CHOL 209 (H) 04/21/2023   TRIG 175 (H) 04/21/2023   HDL 58 04/21/2023    LDLCALC 120 (H) 04/21/2023   ALT 17 04/21/2023   AST 21 04/21/2023   NA 140 04/21/2023   K 5.7 (H) 04/21/2023   CL 99 04/21/2023   CREATININE 1.27 (H) 04/21/2023   BUN 32 (H) 04/21/2023   CO2 24 04/21/2023   TSH 2.530 12/18/2022   INR 0.91 02/28/2016   HGBA1C 6.2 (H) 04/21/2023      Assessment & Plan:    Hypertensive emergency Assessment & Plan: Sent patient to ER due to BP and loss of vision in right eye No sign of active stroke noted during visit      No orders of the defined types were placed in this encounter.   No orders of the defined types were placed in this encounter.    Follow-up: No follow-ups on file.   I,Marla I Leal-Borjas,acting as a scribe for [employer], PA.,have documented all relevant documentation on the behalf of Langley Gauss, PA,as directed by  Langley Gauss, PA while in the presence of Langley Gauss, [place].   An After Visit Summary was printed and given to the patient.  Langley Gauss, [place] [facility] [phone]

## 2023-05-07 ENCOUNTER — Ambulatory Visit: Payer: Medicare Other

## 2023-05-07 VITALS — BP 142/78

## 2023-05-07 DIAGNOSIS — I10 Essential (primary) hypertension: Secondary | ICD-10-CM

## 2023-05-07 NOTE — Progress Notes (Signed)
Patient is in office today for a nurse visit for Blood Pressure Check. Patient BP was 162/82 and then 147/78. Provider spoke with patient recommended she keep taking clonidine daily and keep a record of her blood pressure.

## 2023-05-11 NOTE — Progress Notes (Unsigned)
Subjective:  Patient ID: Susan Roberson, female    DOB: 1950-09-01  Age: 72 y.o. MRN: [id number]  Chief Complaint  Patient presents with   Hypertension    HPI   Patient was seen at the office on 05/05/23 for hypertension. She was referred to ED on 05/05/2023 at [facility]. 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     04/21/2023    7:38 AM 03/31/2023   10:38 AM 01/16/2023    7:50 AM 01/17/2022   10:06 AM 09/12/2021    7:43 AM  Depression screen PHQ 2/9  Decreased Interest 0 0 0 0 0  Down, Depressed, Hopeless 0 0 0 0 0  PHQ - 2 Score 0 0 0 0 0  Altered sleeping 1  0    Tired, decreased energy 0  0    Change in appetite 0  0    Feeling bad or failure about yourself  0  0    Trouble concentrating 0  0    Moving slowly or fidgety/restless 0  0    Suicidal thoughts 0  0    PHQ-9 Score 1  0    Difficult doing work/chores Not difficult at all  Not difficult at all          04/21/2023    7:38 AM  Fall Risk   Falls in the past year? 0  Number falls in past yr: 0  Injury with Fall? 0  Risk for fall due to : No Fall Risks  Follow up Falls evaluation completed    Patient Care Team: Langley Gauss, [place] as PCP - General (Physician Assistant)   Review of Systems  Current Outpatient Medications on File Prior to Visit  Medication Sig Dispense Refill   atorvastatin (LIPITOR) 40 MG tablet Take 1 tablet (40 mg total) by mouth daily. 90 tablet 0   celecoxib (CELEBREX) 100 MG capsule Take 1 capsule (100 mg total) by mouth daily. 90 capsule 0   cyanocobalamin (VITAMIN B12) 1000 MCG tablet Take 1 tablet (1,000 mcg total) by mouth daily. 90 tablet 0   losartan (COZAAR) 100 MG tablet Take 1 tablet (100 mg total) by mouth daily. 30 tablet 0   oxybutynin (DITROPAN-XL) 5 MG 24 hr tablet Take 1 tablet (5 mg total) by mouth at bedtime. 30 tablet 0   pramipexole (MIRAPEX) 0.25 MG tablet Take 1 tablet (0.25 mg total) by mouth at bedtime. 90 tablet 2    Vitamin D, Ergocalciferol, (DRISDOL) 1.25 MG (50000 UNIT) CAPS capsule Take 1 capsule (50,000 Units total) by mouth every 7 (seven) days. 5 capsule 2   No current facility-administered medications on file prior to visit.   Past Medical History:  Diagnosis Date   Arthritis    Cancer (HCC)    pre cancerous skin cancer with multiple lesions removed   Complication of anesthesia    broken teeth   Essential (primary) hypertension    Generalized anxiety disorder    Hyperkalemia    Hypertension    Past Surgical History:  Procedure Laterality Date   SKIN CANCER EXCISION Bilateral    legs   TOTAL HIP ARTHROPLASTY Right    5 yrs ago   TOTAL KNEE ARTHROPLASTY Right 03/10/2016   Procedure: RIGHT TOTAL KNEE ARTHROPLASTY;  Surgeon: Dannielle Huh, MD;  Location: [facility] OR;  Service: Orthopedics;  Laterality: Right;    Family History  Problem Relation Age of Onset   Stroke Father    Cerebrovascular  Accident Other    Diabetes Mellitus II Other    Hyperlipidemia Other    Hypertension Other    Social History   Socioeconomic History   Marital status: Married    Spouse name: Not on file   Number of children: Not on file   Years of education: Not on file   Highest education level: Not on file  Occupational History   Not on file  Tobacco Use   Smoking status: Never   Smokeless tobacco: Never  Vaping Use   Vaping status: Never Used  Substance and Sexual Activity   Alcohol use: Yes    Comment: Drinks on a social basis.   Drug use: No   Sexual activity: Not on file  Other Topics Concern   Not on file  Social History Narrative   Not on file   Social Determinants of Health   Financial Resource Strain: Low Risk  (03/31/2023)   Overall Financial Resource Strain (CARDIA)    Difficulty of Paying Living Expenses: Not hard at all  Food Insecurity: No Food Insecurity (03/31/2023)   Hunger Vital Sign    Worried About Running Out of Food in the Last Year: Never true    Ran Out of Food in the  Last Year: Never true  Transportation Needs: No Transportation Needs (03/31/2023)   PRAPARE - Administrator, Civil Service (Medical): No    Lack of Transportation (Non-Medical): No  Physical Activity: Sufficiently Active (03/31/2023)   Exercise Vital Sign    Days of Exercise per Week: 5 days    Minutes of Exercise per Session: 60 min  Stress: No Stress Concern Present (03/31/2023)   [employer] of Occupational Health - Occupational Stress Questionnaire    Feeling of Stress : Not at all  Social Connections: Socially Integrated (03/31/2023)   Social Connection and Isolation Panel [NHANES]    Frequency of Communication with Friends and Family: More than three times a week    Frequency of Social Gatherings with Friends and Family: More than three times a week    Attends Religious Services: More than 4 times per year    Active Member of [employer] or Organizations: Yes    Attends Engineer, structural: More than 4 times per year    Marital Status: Married    Objective:  There were no vitals taken for this visit.     05/07/2023    8:28 AM 05/07/2023    8:00 AM 05/05/2023    3:56 PM  BP/Weight  Systolic BP 142 162 230  Diastolic BP 78 82 100  Wt. (Lbs)   192  BMI   27.55 kg/m2    Physical Exam  Diabetic Foot Exam - Simple   No data filed      Lab Results  Component Value Date   WBC 7.6 04/21/2023   HGB 13.3 04/21/2023   HCT 40.8 04/21/2023   PLT 354 04/21/2023   GLUCOSE 102 (H) 04/21/2023   CHOL 209 (H) 04/21/2023   TRIG 175 (H) 04/21/2023   HDL 58 04/21/2023   LDLCALC 120 (H) 04/21/2023   ALT 17 04/21/2023   AST 21 04/21/2023   NA 140 04/21/2023   K 5.7 (H) 04/21/2023   CL 99 04/21/2023   CREATININE 1.27 (H) 04/21/2023   BUN 32 (H) 04/21/2023   CO2 24 04/21/2023   TSH 2.530 12/18/2022   INR 0.91 02/28/2016   HGBA1C 6.2 (H) 04/21/2023      Assessment & Plan:  Essential hypertension, benign     No orders of the defined types were  placed in this encounter.   No orders of the defined types were placed in this encounter.    Follow-up: No follow-ups on file.   I,Marla I Leal-Borjas,acting as a scribe for [employer], PA.,have documented all relevant documentation on the behalf of Langley Gauss, PA,as directed by  Langley Gauss, PA while in the presence of Langley Gauss, [place].   An After Visit Summary was printed and given to the patient.  Langley Gauss, [place] [facility] [phone]

## 2023-05-12 ENCOUNTER — Ambulatory Visit: Payer: Medicare Other | Admitting: Physician Assistant

## 2023-05-12 ENCOUNTER — Encounter: Payer: Self-pay | Admitting: Physician Assistant

## 2023-05-12 VITALS — BP 112/58 | HR 67 | Temp 97.2°F | Ht 70.0 in | Wt 193.6 lb

## 2023-05-12 DIAGNOSIS — H348112 Central retinal vein occlusion, right eye, stable: Secondary | ICD-10-CM

## 2023-05-12 DIAGNOSIS — G459 Transient cerebral ischemic attack, unspecified: Secondary | ICD-10-CM | POA: Insufficient documentation

## 2023-05-12 DIAGNOSIS — I10 Essential (primary) hypertension: Secondary | ICD-10-CM | POA: Diagnosis not present

## 2023-05-12 DIAGNOSIS — I161 Hypertensive emergency: Secondary | ICD-10-CM

## 2023-05-12 HISTORY — DX: Hypertensive emergency: I16.1

## 2023-05-12 HISTORY — DX: Transient cerebral ischemic attack, unspecified: G45.9

## 2023-05-12 MED ORDER — CLONIDINE HCL 0.1 MG PO TABS
0.1000 mg | ORAL_TABLET | Freq: Two times a day (BID) | ORAL | 3 refills | Status: DC | PRN
Start: 2023-05-12 — End: 2023-05-26

## 2023-05-12 NOTE — Assessment & Plan Note (Addendum)
Uncontrolled Losartan to 100 mg and clonidine 0.1 mg Continue checking bp daily Schedule nurse visit for next week for BP check Will bring blood pressure cuff. BP Readings from Last 3 Encounters:  05/12/23 (!) 112/58  05/07/23 (!) 142/78  05/05/23 (!) 230/100

## 2023-05-12 NOTE — Assessment & Plan Note (Addendum)
Sent patient to ER due to BP and loss of vision in right eye No sign of active stroke noted during visit

## 2023-05-12 NOTE — Assessment & Plan Note (Signed)
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  Will continue to monitor BP Continue on current medication regiment

## 2023-05-14 ENCOUNTER — Telehealth: Payer: Self-pay

## 2023-05-14 NOTE — Telephone Encounter (Signed)
Susan Roberson called to report that her bp is elevated this afternoon.  It is  190/95 and then 204/180.  She also is experiencing pain in her neck. Her pain scale is rated at a 10.  She denies chest pain or shortness of breath.  She is dizzy but no headache.  Her husband is with her and she is 2 blocks from the hospital.  She was advised to go immediately to the ED for evaluation and treatment.

## 2023-05-18 ENCOUNTER — Ambulatory Visit: Payer: Medicare Other

## 2023-05-18 VITALS — BP 176/95 | HR 75

## 2023-05-18 DIAGNOSIS — I10 Essential (primary) hypertension: Secondary | ICD-10-CM | POA: Diagnosis not present

## 2023-05-18 MED ORDER — CLONIDINE HCL 0.2 MG PO TABS
0.2000 mg | ORAL_TABLET | Freq: Two times a day (BID) | ORAL | Status: DC
Start: 2023-05-18 — End: 2023-05-25

## 2023-05-18 NOTE — Patient Instructions (Signed)
Increase clonidine 0.2 mg twice daily. Continue to monitor bp's at home and record. Recheck in the office in 1 week for a nurse visit.

## 2023-05-18 NOTE — Progress Notes (Signed)
Subjective:  Patient ID: Susan Roberson, female    DOB: 10/24/1950  Age: 72 y.o. MRN: [id number]  Patient is in office today for a nurse visit for Blood Pressure Check. Her bp today is 174/82, pulse 60. Her bp device today in the office is 176/95, pulse 75.  Her home readings over the last two days have ranged from 159-187/54-82.  She has been taking her medications as directed.          05/12/2023    7:45 AM 04/21/2023    7:38 AM 03/31/2023   10:38 AM 01/16/2023    7:50 AM 01/17/2022   10:06 AM  Depression screen PHQ 2/9  Decreased Interest 0 0 0 0 0  Down, Depressed, Hopeless 0 0 0 0 0  PHQ - 2 Score 0 0 0 0 0  Altered sleeping 1 1  0   Tired, decreased energy 0 0  0   Change in appetite 0 0  0   Feeling bad or failure about yourself  0 0  0   Trouble concentrating 0 0  0   Moving slowly or fidgety/restless 0 0  0   Suicidal thoughts 0 0  0   PHQ-9 Score 1 1  0   Difficult doing work/chores Not difficult at all Not difficult at all  Not difficult at all         04/21/2023    7:38 AM  Fall Risk   Falls in the past year? 0  Number falls in past yr: 0  Injury with Fall? 0  Risk for fall due to : No Fall Risks  Follow up Falls evaluation completed    Patient Care Team: Langley Gauss, [place] as PCP - General (Physician Assistant)   Review of Systems  Current Outpatient Medications on File Prior to Visit  Medication Sig Dispense Refill   atorvastatin (LIPITOR) 40 MG tablet Take 1 tablet (40 mg total) by mouth daily. 90 tablet 0   celecoxib (CELEBREX) 100 MG capsule Take 1 capsule (100 mg total) by mouth daily. 90 capsule 0   cloNIDine (CATAPRES) 0.1 MG tablet Take 1 tablet (0.1 mg total) by mouth 2 (two) times daily as needed. 60 tablet 3   cyanocobalamin (VITAMIN B12) 1000 MCG tablet Take 1 tablet (1,000 mcg total) by mouth daily. 90 tablet 0   losartan (COZAAR) 100 MG tablet Take 1 tablet (100 mg total) by mouth daily. 30 tablet 0   oxybutynin (DITROPAN-XL) 5 MG 24 hr tablet Take 1  tablet (5 mg total) by mouth at bedtime. 30 tablet 0   pramipexole (MIRAPEX) 0.25 MG tablet Take 1 tablet (0.25 mg total) by mouth at bedtime. 90 tablet 2   Vitamin D, Ergocalciferol, (DRISDOL) 1.25 MG (50000 UNIT) CAPS capsule Take 1 capsule (50,000 Units total) by mouth every 7 (seven) days. 5 capsule 2   No current facility-administered medications on file prior to visit.   Past Medical History:  Diagnosis Date   Arthritis    Cancer (HCC)    pre cancerous skin cancer with multiple lesions removed   Complication of anesthesia    broken teeth   Essential (primary) hypertension    Generalized anxiety disorder    Hyperkalemia    Hypertension    Past Surgical History:  Procedure Laterality Date   SKIN CANCER EXCISION Bilateral    legs   TOTAL HIP ARTHROPLASTY Right    5 yrs ago   TOTAL KNEE ARTHROPLASTY Right 03/10/2016   Procedure: RIGHT  TOTAL KNEE ARTHROPLASTY;  Surgeon: Dannielle Huh, MD;  Location: [facility] OR;  Service: Orthopedics;  Laterality: Right;    Family History  Problem Relation Age of Onset   Stroke Father    Cerebrovascular Accident Other    Diabetes Mellitus II Other    Hyperlipidemia Other    Hypertension Other    Social History   Socioeconomic History   Marital status: Married    Spouse name: Not on file   Number of children: Not on file   Years of education: Not on file   Highest education level: Not on file  Occupational History   Not on file  Tobacco Use   Smoking status: Never   Smokeless tobacco: Never  Vaping Use   Vaping status: Never Used  Substance and Sexual Activity   Alcohol use: Yes    Comment: Drinks on a social basis.   Drug use: No   Sexual activity: Not on file  Other Topics Concern   Not on file  Social History Narrative   Not on file   Social Determinants of Health   Financial Resource Strain: Low Risk  (03/31/2023)   Overall Financial Resource Strain (CARDIA)    Difficulty of Paying Living Expenses: Not hard at all  Food  Insecurity: No Food Insecurity (03/31/2023)   Hunger Vital Sign    Worried About Running Out of Food in the Last Year: Never true    Ran Out of Food in the Last Year: Never true  Transportation Needs: No Transportation Needs (03/31/2023)   PRAPARE - Administrator, Civil Service (Medical): No    Lack of Transportation (Non-Medical): No  Physical Activity: Sufficiently Active (03/31/2023)   Exercise Vital Sign    Days of Exercise per Week: 5 days    Minutes of Exercise per Session: 60 min  Stress: No Stress Concern Present (03/31/2023)   [employer] of Occupational Health - Occupational Stress Questionnaire    Feeling of Stress : Not at all  Social Connections: Socially Integrated (03/31/2023)   Social Connection and Isolation Panel [NHANES]    Frequency of Communication with Friends and Family: More than three times a week    Frequency of Social Gatherings with Friends and Family: More than three times a week    Attends Religious Services: More than 4 times per year    Active Member of Clubs or Organizations: Yes    Attends Banker Meetings: More than 4 times per year    Marital Status: Married    Objective:  BP (!) 147/82   Pulse 60      05/18/2023    8:08 AM 05/12/2023    7:31 AM 05/07/2023    8:28 AM  BP/Weight  Systolic BP 147 112 142  Diastolic BP 82 58 78  Wt. (Lbs)  193.6   BMI  27.78 kg/m2     Physical Exam  Diabetic Foot Exam - Simple   No data filed      Lab Results  Component Value Date   WBC 7.6 04/21/2023   HGB 13.3 04/21/2023   HCT 40.8 04/21/2023   PLT 354 04/21/2023   GLUCOSE 102 (H) 04/21/2023   CHOL 209 (H) 04/21/2023   TRIG 175 (H) 04/21/2023   HDL 58 04/21/2023   LDLCALC 120 (H) 04/21/2023   ALT 17 04/21/2023   AST 21 04/21/2023   NA 140 04/21/2023   K 5.7 (H) 04/21/2023   CL 99 04/21/2023   CREATININE 1.27 (H)  04/21/2023   BUN 32 (H) 04/21/2023   CO2 24 04/21/2023   TSH 2.530 12/18/2022   INR 0.91  02/28/2016   HGBA1C 6.2 (H) 04/21/2023      Assessment & Plan:    There are no diagnoses linked to this encounter.   No orders of the defined types were placed in this encounter.   No orders of the defined types were placed in this encounter.    Follow-up: No follow-ups on file.   I,Adelin Ventrella M Candler Ginsberg,acting as a scribe for [employer].,have documented all relevant documentation on the behalf of [employer],as directed by  [employer]urse while in the presence of [employer]urse.   An After Visit Summary was printed and given to the patient.  [employer] [facility] [phone]

## 2023-05-24 ENCOUNTER — Other Ambulatory Visit: Payer: Self-pay | Admitting: Physician Assistant

## 2023-05-24 DIAGNOSIS — I10 Essential (primary) hypertension: Secondary | ICD-10-CM

## 2023-05-25 ENCOUNTER — Ambulatory Visit: Payer: Medicare Other

## 2023-05-25 ENCOUNTER — Encounter: Payer: Self-pay | Admitting: Physician Assistant

## 2023-05-25 ENCOUNTER — Ambulatory Visit: Payer: Medicare Other | Admitting: Physician Assistant

## 2023-05-25 VITALS — BP 162/78 | HR 58 | Temp 97.8°F | Ht 70.0 in | Wt 195.0 lb

## 2023-05-25 VITALS — BP 140/86

## 2023-05-25 DIAGNOSIS — I10 Essential (primary) hypertension: Secondary | ICD-10-CM

## 2023-05-25 MED ORDER — OLMESARTAN MEDOXOMIL 20 MG PO TABS
20.0000 mg | ORAL_TABLET | Freq: Every day | ORAL | 1 refills | Status: DC
Start: 2023-05-25 — End: 2023-06-23

## 2023-05-25 NOTE — Assessment & Plan Note (Signed)
Uncontrolled Discontinued Losartan 100mg  Prescribed Benicar 20mg  Monitor BP Call ophthalmologist if there are vision changes Will adjust medicine if it continues to be high

## 2023-05-25 NOTE — Progress Notes (Signed)
Subjective:  Patient ID: [name], female    DOB: 1951-03-18  Age: 72 y.o. MRN: [id number]  Chief Complaint  Patient presents with   Hypertension    HPI   She was last seen for hypertension 2 weeks ago.  BP at that visit was 112/58. Management includes Losartan to 100 mg and clonidine 0.2 mg twice a day as need .  Freqent urination, BP is still elevated, Hearing and vision are not getting any better. Especially in the past few days. Has monovision, Admits to when having high BP that she will get jittery and anxious and that is how she knows that she needs to take it. Discussed medications and how me discontinued her previous medications for BP because it had the hydrochlorothiazide in combination with it and her kidney function decreased. We will now look at putting on the Olmesartan by itself and see if that helps her BP.   She reports good compliance with treatment. She is not having side effects.  She is following a Regular, Low Sodium diet. She is not exercising. She does not smoke.  Use of agents associated with hypertension: none.   Outside blood pressures are 150s/80s.  Pertinent labs: Lab Results  Component Value Date   CHOL 209 (H) 04/21/2023   HDL 58 04/21/2023   LDLCALC 120 (H) 04/21/2023   TRIG 175 (H) 04/21/2023   CHOLHDL 3.6 04/21/2023   Lab Results  Component Value Date   NA 140 04/21/2023   K 5.7 (H) 04/21/2023   CREATININE 1.27 (H) 04/21/2023   EGFR 44 (L) 04/21/2023   GLUCOSE 102 (H) 04/21/2023     The 10-year ASCVD risk score (Arnett DK, et al., 2019) is: 28.9%       05/12/2023    7:45 AM 04/21/2023    7:38 AM 03/31/2023   10:38 AM 01/16/2023    7:50 AM 01/17/2022   10:06 AM  Depression screen PHQ 2/9  Decreased Interest 0 0 0 0 0  Down, Depressed, Hopeless 0 0 0 0 0  PHQ - 2 Score 0 0 0 0 0  Altered sleeping 1 1  0   Tired, decreased energy 0 0  0   Change in appetite 0 0  0   Feeling bad or failure about yourself  0 0  0   Trouble  concentrating 0 0  0   Moving slowly or fidgety/restless 0 0  0   Suicidal thoughts 0 0  0   PHQ-9 Score 1 1  0   Difficult doing work/chores Not difficult at all Not difficult at all  Not difficult at all         04/21/2023    7:38 AM  Fall Risk   Falls in the past year? 0  Number falls in past yr: 0  Injury with Fall? 0  Risk for fall due to : No Fall Risks  Follow up Falls evaluation completed    Patient Care Team: Langley Gauss, [place] as PCP - General (Physician Assistant)   Review of Systems  Constitutional:  Negative for fatigue.  HENT:  Negative for congestion, ear pain and sore throat.   Respiratory:  Negative for cough and shortness of breath.   Cardiovascular:  Negative for chest pain.  Gastrointestinal:  Negative for abdominal pain, constipation, diarrhea, nausea and vomiting.  Genitourinary:  Negative for dysuria, frequency and urgency.  Musculoskeletal:  Negative for arthralgias, back pain and myalgias.  Neurological:  Negative for dizziness and headaches.  Psychiatric/Behavioral:  Negative for agitation and sleep disturbance. The patient is not nervous/anxious.     Current Outpatient Medications on File Prior to Visit  Medication Sig Dispense Refill   atorvastatin (LIPITOR) 40 MG tablet Take 1 tablet (40 mg total) by mouth daily. 90 tablet 0   celecoxib (CELEBREX) 100 MG capsule Take 1 capsule (100 mg total) by mouth daily. 90 capsule 0   cloNIDine (CATAPRES) 0.1 MG tablet Take 1 tablet (0.1 mg total) by mouth 2 (two) times daily as needed. (Patient taking differently: Take 0.2 mg by mouth 2 (two) times daily as needed.) 60 tablet 3   cyanocobalamin (VITAMIN B12) 1000 MCG tablet Take 1 tablet (1,000 mcg total) by mouth daily. 90 tablet 0   oxybutynin (DITROPAN-XL) 5 MG 24 hr tablet Take 1 tablet (5 mg total) by mouth at bedtime. 30 tablet 0   pramipexole (MIRAPEX) 0.25 MG tablet Take 1 tablet (0.25 mg total) by mouth at bedtime. 90 tablet 2   Vitamin D, Ergocalciferol,  (DRISDOL) 1.25 MG (50000 UNIT) CAPS capsule Take 1 capsule (50,000 Units total) by mouth every 7 (seven) days. 5 capsule 2   No current facility-administered medications on file prior to visit.   Past Medical History:  Diagnosis Date   Arthritis    Cancer (HCC)    pre cancerous skin cancer with multiple lesions removed   Complication of anesthesia    broken teeth   Essential (primary) hypertension    Generalized anxiety disorder    Hyperkalemia    Hypertension    Past Surgical History:  Procedure Laterality Date   SKIN CANCER EXCISION Bilateral    legs   TOTAL HIP ARTHROPLASTY Right    5 yrs ago   TOTAL KNEE ARTHROPLASTY Right 03/10/2016   Procedure: RIGHT TOTAL KNEE ARTHROPLASTY;  Surgeon: Dannielle Huh, MD;  Location: [facility] OR;  Service: Orthopedics;  Laterality: Right;    Family History  Problem Relation Age of Onset   Stroke Father    Cerebrovascular Accident Other    Diabetes Mellitus II Other    Hyperlipidemia Other    Hypertension Other    Social History   Socioeconomic History   Marital status: Married    Spouse name: Not on file   Number of children: Not on file   Years of education: Not on file   Highest education level: Not on file  Occupational History   Not on file  Tobacco Use   Smoking status: Never   Smokeless tobacco: Never  Vaping Use   Vaping status: Never Used  Substance and Sexual Activity   Alcohol use: Yes    Comment: Drinks on a social basis.   Drug use: No   Sexual activity: Not on file  Other Topics Concern   Not on file  Social History Narrative   Not on file   Social Determinants of Health   Financial Resource Strain: Low Risk  (03/31/2023)   Overall Financial Resource Strain (CARDIA)    Difficulty of Paying Living Expenses: Not hard at all  Food Insecurity: No Food Insecurity (03/31/2023)   Hunger Vital Sign    Worried About Running Out of Food in the Last Year: Never true    Ran Out of Food in the Last Year: Never true   Transportation Needs: No Transportation Needs (03/31/2023)   PRAPARE - Administrator, Civil Service (Medical): No    Lack of Transportation (Non-Medical): No  Physical Activity: Sufficiently Active (03/31/2023)   Exercise Vital  Sign    Days of Exercise per Week: 5 days    Minutes of Exercise per Session: 60 min  Stress: No Stress Concern Present (03/31/2023)   [employer] of Occupational Health - Occupational Stress Questionnaire    Feeling of Stress : Not at all  Social Connections: Socially Integrated (03/31/2023)   Social Connection and Isolation Panel [NHANES]    Frequency of Communication with Friends and Family: More than three times a week    Frequency of Social Gatherings with Friends and Family: More than three times a week    Attends Religious Services: More than 4 times per year    Active Member of [employer] or Organizations: Yes    Attends Engineer, structural: More than 4 times per year    Marital Status: Married    Objective:  BP (!) 162/78 (BP Location: Left Arm, Patient Position: Sitting)   Pulse (!) 58   Temp 97.8 F (36.6 C) (Oral)   Ht 5\' 10"  (1.778 m)   Wt 195 lb (88.5 kg)   SpO2 95%   BMI 27.98 kg/m      05/25/2023    1:40 PM 05/25/2023    8:38 AM 05/25/2023    8:23 AM  BP/Weight  Systolic BP 162 140 160  Diastolic BP 78 86 88  Wt. (Lbs) 195    BMI 27.98 kg/m2      Physical Exam Vitals reviewed.  Constitutional:      Appearance: Normal appearance.  Cardiovascular:     Rate and Rhythm: Normal rate and regular rhythm.     Heart sounds: Normal heart sounds.  Pulmonary:     Effort: Pulmonary effort is normal.     Breath sounds: Normal breath sounds.  Abdominal:     General: Bowel sounds are normal.     Palpations: Abdomen is soft.     Tenderness: There is no abdominal tenderness.  Neurological:     Mental Status: She is alert and oriented to person, place, and time.  Psychiatric:        Mood and Affect: Mood normal.         Behavior: Behavior normal.     Diabetic Foot Exam - Simple   No data filed      Lab Results  Component Value Date   WBC 7.6 04/21/2023   HGB 13.3 04/21/2023   HCT 40.8 04/21/2023   PLT 354 04/21/2023   GLUCOSE 102 (H) 04/21/2023   CHOL 209 (H) 04/21/2023   TRIG 175 (H) 04/21/2023   HDL 58 04/21/2023   LDLCALC 120 (H) 04/21/2023   ALT 17 04/21/2023   AST 21 04/21/2023   NA 140 04/21/2023   K 5.7 (H) 04/21/2023   CL 99 04/21/2023   CREATININE 1.27 (H) 04/21/2023   BUN 32 (H) 04/21/2023   CO2 24 04/21/2023   TSH 2.530 12/18/2022   INR 0.91 02/28/2016   HGBA1C 6.2 (H) 04/21/2023   Total time spent on today's visit was greater than 20 minutes, including both face-to-face time and nonface-to-face time personally spent on review of chart (labs and imaging), discussing labs and goals, discussing further work-up, treatment options, referrals to specialist if needed, reviewing outside records of pertinent, answering patient's questions, and coordinating care.    Assessment & Plan:    Essential hypertension, benign Assessment & Plan: Uncontrolled Discontinued Losartan 100mg  Prescribed Benicar 20mg  Monitor BP Call ophthalmologist if there are vision changes Will adjust medicine if it continues to be high   Uncontrolled  hypertension -     Olmesartan Medoxomil; Take 1 tablet (20 mg total) by mouth daily.  Dispense: 30 tablet; Refill: 1     Meds ordered this encounter  Medications   olmesartan (BENICAR) 20 MG tablet    Sig: Take 1 tablet (20 mg total) by mouth daily.    Dispense:  30 tablet    Refill:  1    No orders of the defined types were placed in this encounter.    Follow-up: No follow-ups on file.   I,Jacqua L Marsh,acting as a scribe for [employer], PA.,have documented all relevant documentation on the behalf of Langley Gauss, PA,as directed by  Langley Gauss, PA while in the presence of Langley Gauss, [place].   An After Visit Summary was printed and given to  the patient.  Langley Gauss, [place] [facility] [phone]

## 2023-05-25 NOTE — Progress Notes (Signed)
Patient came in for a Nurse visit for Blood pressure check and stated that she is very frustrated with her BP readings. She states that the Blood pressure has been nothing but high and is not getting better. Patient is scheduled to come back this afternoon to be seen by provider.

## 2023-05-26 ENCOUNTER — Other Ambulatory Visit: Payer: Self-pay

## 2023-05-26 DIAGNOSIS — I10 Essential (primary) hypertension: Secondary | ICD-10-CM

## 2023-05-26 MED ORDER — CLONIDINE HCL 0.2 MG PO TABS
0.2000 mg | ORAL_TABLET | Freq: Two times a day (BID) | ORAL | 2 refills | Status: DC | PRN
Start: 2023-05-26 — End: 2023-08-07

## 2023-05-27 ENCOUNTER — Other Ambulatory Visit: Payer: Self-pay | Admitting: Physician Assistant

## 2023-05-27 DIAGNOSIS — I10 Essential (primary) hypertension: Secondary | ICD-10-CM

## 2023-06-01 NOTE — Progress Notes (Unsigned)
Subjective:  Patient ID: Susan Roberson, female    DOB: 11/11/1950  Age: 72 y.o. MRN: [id number]  Chief Complaint  Patient presents with   Hypertension    HPI   She was last seen for hypertension 1 week ago.  BP at that visit was 112/58.  Management includes Olmesartan 20 mg and clonidine 0.2 mg twice a day as need .   She reports good compliance with treatment. She is not having side effects.  She is following a Regular, Low Sodium diet. She is not exercising. She does not smoke.  Use of agents associated with hypertension: none.   Outside blood pressures are 150s/80s.     05/12/2023    7:45 AM 04/21/2023    7:38 AM 03/31/2023   10:38 AM 01/16/2023    7:50 AM 01/17/2022   10:06 AM  Depression screen PHQ 2/9  Decreased Interest 0 0 0 0 0  Down, Depressed, Hopeless 0 0 0 0 0  PHQ - 2 Score 0 0 0 0 0  Altered sleeping 1 1  0   Tired, decreased energy 0 0  0   Change in appetite 0 0  0   Feeling bad or failure about yourself  0 0  0   Trouble concentrating 0 0  0   Moving slowly or fidgety/restless 0 0  0   Suicidal thoughts 0 0  0   PHQ-9 Score 1 1  0   Difficult doing work/chores Not difficult at all Not difficult at all  Not difficult at all         04/21/2023    7:38 AM  Fall Risk   Falls in the past year? 0  Number falls in past yr: 0  Injury with Fall? 0  Risk for fall due to : No Fall Risks  Follow up Falls evaluation completed    Patient Care Team: Langley Gauss, [place] as PCP - General (Physician Assistant)   Review of Systems  Current Outpatient Medications on File Prior to Visit  Medication Sig Dispense Refill   atorvastatin (LIPITOR) 40 MG tablet Take 1 tablet (40 mg total) by mouth daily. 90 tablet 0   celecoxib (CELEBREX) 100 MG capsule Take 1 capsule (100 mg total) by mouth daily. 90 capsule 0   cloNIDine (CATAPRES) 0.2 MG tablet Take 1 tablet (0.2 mg total) by mouth 2 (two) times daily as needed. 60 tablet 2   cyanocobalamin (VITAMIN B12) 1000 MCG  tablet Take 1 tablet (1,000 mcg total) by mouth daily. 90 tablet 0   olmesartan (BENICAR) 20 MG tablet Take 1 tablet (20 mg total) by mouth daily. 30 tablet 1   oxybutynin (DITROPAN-XL) 5 MG 24 hr tablet Take 1 tablet (5 mg total) by mouth at bedtime. 30 tablet 0   pramipexole (MIRAPEX) 0.25 MG tablet Take 1 tablet (0.25 mg total) by mouth at bedtime. 90 tablet 2   Vitamin D, Ergocalciferol, (DRISDOL) 1.25 MG (50000 UNIT) CAPS capsule Take 1 capsule (50,000 Units total) by mouth every 7 (seven) days. 5 capsule 2   No current facility-administered medications on file prior to visit.   Past Medical History:  Diagnosis Date   Arthritis    Cancer (HCC)    pre cancerous skin cancer with multiple lesions removed   Complication of anesthesia    broken teeth   Essential (primary) hypertension    Generalized anxiety disorder    Hyperkalemia    Hypertension    Past Surgical History:  Procedure  Laterality Date   SKIN CANCER EXCISION Bilateral    legs   TOTAL HIP ARTHROPLASTY Right    5 yrs ago   TOTAL KNEE ARTHROPLASTY Right 03/10/2016   Procedure: RIGHT TOTAL KNEE ARTHROPLASTY;  Surgeon: Dannielle Huh, MD;  Location: [facility] OR;  Service: Orthopedics;  Laterality: Right;    Family History  Problem Relation Age of Onset   Stroke Father    Cerebrovascular Accident Other    Diabetes Mellitus II Other    Hyperlipidemia Other    Hypertension Other    Social History   Socioeconomic History   Marital status: Married    Spouse name: Not on file   Number of children: Not on file   Years of education: Not on file   Highest education level: Not on file  Occupational History   Not on file  Tobacco Use   Smoking status: Never   Smokeless tobacco: Never  Vaping Use   Vaping status: Never Used  Substance and Sexual Activity   Alcohol use: Yes    Comment: Drinks on a social basis.   Drug use: No   Sexual activity: Not on file  Other Topics Concern   Not on file  Social History Narrative    Not on file   Social Determinants of Health   Financial Resource Strain: Low Risk  (03/31/2023)   Overall Financial Resource Strain (CARDIA)    Difficulty of Paying Living Expenses: Not hard at all  Food Insecurity: No Food Insecurity (03/31/2023)   Hunger Vital Sign    Worried About Running Out of Food in the Last Year: Never true    Ran Out of Food in the Last Year: Never true  Transportation Needs: No Transportation Needs (03/31/2023)   PRAPARE - Administrator, Civil Service (Medical): No    Lack of Transportation (Non-Medical): No  Physical Activity: Sufficiently Active (03/31/2023)   Exercise Vital Sign    Days of Exercise per Week: 5 days    Minutes of Exercise per Session: 60 min  Stress: No Stress Concern Present (03/31/2023)   [employer] of Occupational Health - Occupational Stress Questionnaire    Feeling of Stress : Not at all  Social Connections: Socially Integrated (03/31/2023)   Social Connection and Isolation Panel [NHANES]    Frequency of Communication with Friends and Family: More than three times a week    Frequency of Social Gatherings with Friends and Family: More than three times a week    Attends Religious Services: More than 4 times per year    Active Member of [employer] or Organizations: Yes    Attends Engineer, structural: More than 4 times per year    Marital Status: Married    Objective:  There were no vitals taken for this visit.     05/25/2023    1:40 PM 05/25/2023    8:38 AM 05/25/2023    8:23 AM  BP/Weight  Systolic BP 162 140 160  Diastolic BP 78 86 88  Wt. (Lbs) 195    BMI 27.98 kg/m2      Physical Exam  Diabetic Foot Exam - Simple   No data filed      Lab Results  Component Value Date   WBC 7.6 04/21/2023   HGB 13.3 04/21/2023   HCT 40.8 04/21/2023   PLT 354 04/21/2023   GLUCOSE 102 (H) 04/21/2023   CHOL 209 (H) 04/21/2023   TRIG 175 (H) 04/21/2023   HDL 58 04/21/2023  LDLCALC 120 (H) 04/21/2023   ALT  17 04/21/2023   AST 21 04/21/2023   NA 140 04/21/2023   K 5.7 (H) 04/21/2023   CL 99 04/21/2023   CREATININE 1.27 (H) 04/21/2023   BUN 32 (H) 04/21/2023   CO2 24 04/21/2023   TSH 2.530 12/18/2022   INR 0.91 02/28/2016   HGBA1C 6.2 (H) 04/21/2023      Assessment & Plan:    Essential hypertension, benign     No orders of the defined types were placed in this encounter.   No orders of the defined types were placed in this encounter.    Follow-up: No follow-ups on file.   I,Marla I Leal-Borjas,acting as a scribe for [employer], PA.,have documented all relevant documentation on the behalf of Langley Gauss, PA,as directed by  Langley Gauss, PA while in the presence of Langley Gauss, [place].   An After Visit Summary was printed and given to the patient.  Langley Gauss, [place] [facility] [phone]

## 2023-06-02 ENCOUNTER — Encounter: Payer: Self-pay | Admitting: Physician Assistant

## 2023-06-02 ENCOUNTER — Telehealth: Payer: Self-pay

## 2023-06-02 ENCOUNTER — Ambulatory Visit: Payer: Medicare Other | Attending: Cardiology | Admitting: Cardiology

## 2023-06-02 ENCOUNTER — Encounter: Payer: Self-pay | Admitting: Cardiology

## 2023-06-02 ENCOUNTER — Ambulatory Visit: Payer: Medicare Other | Admitting: Physician Assistant

## 2023-06-02 VITALS — BP 198/98 | HR 66 | Ht 70.0 in | Wt 196.4 lb

## 2023-06-02 VITALS — BP 168/90 | HR 62 | Temp 97.5°F | Resp 16 | Ht 70.0 in | Wt 196.0 lb

## 2023-06-02 DIAGNOSIS — I10 Essential (primary) hypertension: Secondary | ICD-10-CM

## 2023-06-02 DIAGNOSIS — R0609 Other forms of dyspnea: Secondary | ICD-10-CM

## 2023-06-02 DIAGNOSIS — R600 Localized edema: Secondary | ICD-10-CM

## 2023-06-02 DIAGNOSIS — R3589 Other polyuria: Secondary | ICD-10-CM | POA: Diagnosis not present

## 2023-06-02 DIAGNOSIS — E782 Mixed hyperlipidemia: Secondary | ICD-10-CM | POA: Diagnosis not present

## 2023-06-02 DIAGNOSIS — R7989 Other specified abnormal findings of blood chemistry: Secondary | ICD-10-CM

## 2023-06-02 HISTORY — DX: Other forms of dyspnea: R06.09

## 2023-06-02 HISTORY — DX: Localized edema: R60.0

## 2023-06-02 LAB — LAB REPORT - SCANNED
D-Dimer: 1.06 — ABNORMAL HIGH
EGFR: 60

## 2023-06-02 MED ORDER — HYDROCHLOROTHIAZIDE 25 MG PO TABS
25.0000 mg | ORAL_TABLET | Freq: Every day | ORAL | 3 refills | Status: DC
Start: 2023-06-02 — End: 2023-08-21

## 2023-06-02 NOTE — Patient Instructions (Addendum)
Medication Instructions:   START: hydrochlorothiazide 25mg  1 tablet daily   Lab Work: None Ordered If you have labs (blood work) drawn today and your tests are completely normal, you will receive your results only by: MyChart Message (if you have MyChart) OR A paper copy in the mail If you have any lab test that is abnormal or we need to change your treatment, we will call you to review the results.   Testing/Procedures: Non-Cardiac CT Angiography (CTA), is a special type of CT scan that uses a computer to produce multi-dimensional views of major blood vessels throughout the body. In CT angiography, a contrast material is injected through an IV to help visualize the blood vessels -   Follow-Up: At [facility], you and your health needs are our priority.  As part of our continuing mission to provide you with exceptional heart care, we have created designated Provider Care Teams.  These Care Teams include your primary Cardiologist (physician) and Advanced Practice Providers (APPs -  Physician Assistants and Nurse Practitioners) who all work together to provide you with the care you need, when you need it.  We recommend signing up for the patient portal called "MyChart".  Sign up information is provided on this After Visit Summary.  MyChart is used to connect with patients for Virtual Visits (Telemedicine).  Patients are able to view lab/test results, encounter notes, upcoming appointments, etc.  Non-urgent messages can be sent to your provider as well.   To learn more about what you can do with MyChart, go to [email or web address].    Your next appointment:   Friday October 18  The format for your next appointment:   In Person  Provider:   Gypsy Balsam, MD    Other Instructions NA

## 2023-06-02 NOTE — Assessment & Plan Note (Signed)
Sent for STAT labs Sent referral to cardiology for possible echo Will continue to monitor

## 2023-06-02 NOTE — Progress Notes (Signed)
Cardiology Consultation:    Date:  06/02/2023   ID:  Susan Roberson, DOB May 21, 1951, MRN [id number]  PCP:  Langley Gauss, PA  Cardiologist:  Gypsy Balsam, MD   Referring MD: Langley Gauss, [place]   Chief Complaint  Patient presents with   Leg Swelling   Fatigue   Shortness of Breath   Hypertension    History of Present Illness:    Susan Roberson is a 72 y.o. female who is being seen today for the evaluation of high blood pressure which is difficult to control, dyspnea on exertion at the request of Langley Gauss, [place].  Past medical history significant for essential hypertension, chronic swelling of lower extremities, dyspnea on exertion.  She had a history of high blood pressure for years however the last few months became very difficult to control she required 2 emergency room visits.  1 was because of partial blindness apparently that subsided completely.  She was put on different combination of medication but still blood pressure seems to be very poorly controlled.  Today she went to her primary care physician and she was sent to hospital to have blood test done.  Her D-dimer is abnormal.  She complained of having shortness of breath she said when she tried to do something she will get short of breath.  Interestingly she does have paroxysmal atrial dyspnea but she said she have to wake up and go to the restroom many times during the night.  She denies having any pleuritic chest pain.  She does have chronic swelling of lower extremities which is not much peeling.  She does not smoke never did.  Never had any heart trouble.  She does have history of anxiety though.  Past Medical History:  Diagnosis Date   Arthritis    Cancer (HCC)    pre cancerous skin cancer with multiple lesions removed   Complication of anesthesia    broken teeth   Essential (primary) hypertension    Generalized anxiety disorder    Hyperkalemia    Hypertension     Past Surgical History:  Procedure Laterality Date    SKIN CANCER EXCISION Bilateral    legs   TOTAL HIP ARTHROPLASTY Right    5 yrs ago   TOTAL KNEE ARTHROPLASTY Right 03/10/2016   Procedure: RIGHT TOTAL KNEE ARTHROPLASTY;  Surgeon: Dannielle Huh, MD;  Location: [facility] OR;  Service: Orthopedics;  Laterality: Right;    Current Medications: Current Meds  Medication Sig   atorvastatin (LIPITOR) 40 MG tablet Take 1 tablet (40 mg total) by mouth daily.   celecoxib (CELEBREX) 100 MG capsule Take 1 capsule (100 mg total) by mouth daily.   cloNIDine (CATAPRES) 0.2 MG tablet Take 1 tablet (0.2 mg total) by mouth 2 (two) times daily as needed.   cyanocobalamin (VITAMIN B12) 1000 MCG tablet Take 1 tablet (1,000 mcg total) by mouth daily.   hydrochlorothiazide (HYDRODIURIL) 25 MG tablet Take 1 tablet (25 mg total) by mouth daily.   olmesartan (BENICAR) 20 MG tablet Take 1 tablet (20 mg total) by mouth daily.   oxybutynin (DITROPAN-XL) 5 MG 24 hr tablet Take 1 tablet (5 mg total) by mouth at bedtime.   pramipexole (MIRAPEX) 0.25 MG tablet Take 1 tablet (0.25 mg total) by mouth at bedtime.   Vitamin D, Ergocalciferol, (DRISDOL) 1.25 MG (50000 UNIT) CAPS capsule Take 1 capsule (50,000 Units total) by mouth every 7 (seven) days.     Allergies:   Prednisone, Statins, Zetia [ezetimibe], and No known allergies  Social History   Socioeconomic History   Marital status: Married    Spouse name: Not on file   Number of children: Not on file   Years of education: Not on file   Highest education level: Not on file  Occupational History   Not on file  Tobacco Use   Smoking status: Never   Smokeless tobacco: Never  Vaping Use   Vaping status: Never Used  Substance and Sexual Activity   Alcohol use: Yes    Comment: Drinks on a social basis.   Drug use: No   Sexual activity: Not on file  Other Topics Concern   Not on file  Social History Narrative   Not on file   Social Determinants of Health   Financial Resource Strain: Low Risk  (03/31/2023)   Overall  Financial Resource Strain (CARDIA)    Difficulty of Paying Living Expenses: Not hard at all  Food Insecurity: No Food Insecurity (03/31/2023)   Hunger Vital Sign    Worried About Running Out of Food in the Last Year: Never true    Ran Out of Food in the Last Year: Never true  Transportation Needs: No Transportation Needs (03/31/2023)   PRAPARE - Administrator, Civil Service (Medical): No    Lack of Transportation (Non-Medical): No  Physical Activity: Sufficiently Active (03/31/2023)   Exercise Vital Sign    Days of Exercise per Week: 5 days    Minutes of Exercise per Session: 60 min  Stress: No Stress Concern Present (03/31/2023)   [employer] of Occupational Health - Occupational Stress Questionnaire    Feeling of Stress : Not at all  Social Connections: Socially Integrated (03/31/2023)   Social Connection and Isolation Panel [NHANES]    Frequency of Communication with Friends and Family: More than three times a week    Frequency of Social Gatherings with Friends and Family: More than three times a week    Attends Religious Services: More than 4 times per year    Active Member of [employer] or Organizations: Yes    Attends Engineer, structural: More than 4 times per year    Marital Status: Married     Family History: The patient's family history includes Cerebrovascular Accident in an other family member; Diabetes Mellitus II in an other family member; Hyperlipidemia in an other family member; Hypertension in an other family member; Stroke in her father. ROS:   Please see the history of present illness.    All 14 point review of systems negative except as described per history of present illness.  EKGs/Labs/Other Studies Reviewed:    The following studies were reviewed today:   EKG:       Recent Labs: 12/18/2022: TSH 2.530 04/21/2023: ALT 17; BUN 32; Creatinine, Ser 1.27; Hemoglobin 13.3; Platelets 354; Potassium 5.7; Sodium 140  Recent Lipid Panel     Component Value Date/Time   CHOL 209 (H) 04/21/2023 0801   TRIG 175 (H) 04/21/2023 0801   HDL 58 04/21/2023 0801   CHOLHDL 3.6 04/21/2023 0801   LDLCALC 120 (H) 04/21/2023 0801    Physical Exam:    VS:  BP (!) 198/98 (BP Location: Left Arm, Patient Position: Sitting)   Pulse 66   Ht 5\' 10"  (1.778 m)   Wt 196 lb 6.4 oz (89.1 kg)   SpO2 95%   BMI 28.18 kg/m     Wt Readings from Last 3 Encounters:  06/02/23 196 lb 6.4 oz (89.1 kg)  06/02/23 196  lb (88.9 kg)  05/25/23 195 lb (88.5 kg)     GEN:  Well nourished, well developed in no acute distress HEENT: Normal NECK: No JVD; No carotid bruits LYMPHATICS: No lymphadenopathy CARDIAC: RRR, no murmurs, no rubs, no gallops RESPIRATORY:  Clear to auscultation without rales, wheezing or rhonchi  ABDOMEN: Soft, non-tender, non-distended MUSCULOSKELETAL:  No edema; No deformity  SKIN: Warm and dry NEUROLOGIC:  Alert and oriented x 3 PSYCHIATRIC:  Normal affect   ASSESSMENT:    1. Essential hypertension, benign   2. Dyspnea on exertion   3. Mixed hyperlipidemia   4. Polyuria    PLAN:    In order of problems listed above:  High blood pressure which seems to be difficult to control I will add hydrochlorothiazide 25 mg daily to her medical regimen.  I will check her Chem-7 on Thursday and see her in the office on Friday.  I asked her to check blood pressure on the regular basis twice daily different days different time of the day and taken out of it.  I suspect which will require few maneuvers until we get her blood pressure under control my preference will be to discontinue her clonidine and put her on diuretics probably also Aldactone as well as ARB.  She may required also some beta-blocker which can be helpful since I suspect some component of anxiety playing a role here. Abnormal D-dimer with lady with shortness of breath that happened the last few weeks.  I think in this situation we obligated to rule out pulmonary emboli I  scheduled her to have CT of her chest done. Polyuria and could be related simply to chronic swelling of lower extremities when she lay down in the bed at night that mobilize her fluids and make her urinate a lot during the night.  Hopefully addition of diuretic that she takes in the morning will help with this scenario.   Medication Adjustments/Labs and Tests Ordered: Current medicines are reviewed at length with the patient today.  Concerns regarding medicines are outlined above.  Orders Placed This Encounter  Procedures   EKG 12-Lead   Meds ordered this encounter  Medications   hydrochlorothiazide (HYDRODIURIL) 25 MG tablet    Sig: Take 1 tablet (25 mg total) by mouth daily.    Dispense:  90 tablet    Refill:  3    Signed, Georgeanna Lea, MD, [facility]. 06/02/2023 3:59 PM    [facility]

## 2023-06-02 NOTE — Assessment & Plan Note (Signed)
STAT labs ordered Referral sent to cardiology and set her an appointment for 2:30pm today Will continue to monitor symptoms Will send for CTA if d-dimmer positive

## 2023-06-02 NOTE — Addendum Note (Signed)
Addended by: Baldo Ash D on: 06/02/2023 04:26 PM   Modules accepted: Orders

## 2023-06-03 ENCOUNTER — Encounter: Payer: Self-pay | Admitting: Physician Assistant

## 2023-06-05 ENCOUNTER — Encounter: Payer: Self-pay | Admitting: Cardiology

## 2023-06-05 ENCOUNTER — Ambulatory Visit: Payer: Medicare Other | Attending: Cardiology | Admitting: Cardiology

## 2023-06-05 VITALS — BP 142/76 | HR 76 | Ht 70.0 in | Wt 191.2 lb

## 2023-06-05 DIAGNOSIS — R0609 Other forms of dyspnea: Secondary | ICD-10-CM

## 2023-06-05 DIAGNOSIS — E782 Mixed hyperlipidemia: Secondary | ICD-10-CM

## 2023-06-05 DIAGNOSIS — I10 Essential (primary) hypertension: Secondary | ICD-10-CM | POA: Diagnosis not present

## 2023-06-05 DIAGNOSIS — F411 Generalized anxiety disorder: Secondary | ICD-10-CM | POA: Diagnosis not present

## 2023-06-05 NOTE — Progress Notes (Unsigned)
Cardiology Office Note:    Date:  06/05/2023   ID:  Susan Roberson, DOB 1951/07/23, MRN [id number]  PCP:  Langley Gauss, PA  Cardiologist:  Gypsy Balsam, MD    Referring MD: Langley Gauss, [place]   Chief Complaint  Patient presents with   Results    History of Present Illness:    Susan Roberson is a 72 y.o. female with past medical history significant for essential hypertension seems to be difficult to control, dyspnea exertion, anxiety.  She was referred to [place] because of difficulty controlling her blood pressure.  I just saw her 2 days ago concerned about salts of the fact that she got elevated D-dimer.  CT of the chest has been performed CT of the chest showed no evidence of PE, minimal atherosclerosis, she was find to have some mildly enlarged right healer lymph nodes significance of this is unclear.  She was also find to have some calcified lesion in the liver measuring 2 cm also 1 cm exophytic high attenuating lesion in the superior pole of the left kidney.  Recommendation was to perform ultrasounds on outpatient basis. She comes today to months for follow-up doing better.  Her blood pressure seems to be better give her hydrochlorothiazide and asked her to reduce dose of clonidine.  Seems to be feeling better.  Past Medical History:  Diagnosis Date   Arthritis    Cancer (HCC)    pre cancerous skin cancer with multiple lesions removed   Complication of anesthesia    broken teeth   Essential (primary) hypertension    Generalized anxiety disorder    Hyperkalemia    Hypertension     Past Surgical History:  Procedure Laterality Date   SKIN CANCER EXCISION Bilateral    legs   TOTAL HIP ARTHROPLASTY Right    5 yrs ago   TOTAL KNEE ARTHROPLASTY Right 03/10/2016   Procedure: RIGHT TOTAL KNEE ARTHROPLASTY;  Surgeon: Dannielle Huh, MD;  Location: [facility] OR;  Service: Orthopedics;  Laterality: Right;    Current Medications: Current Meds  Medication Sig   atorvastatin (LIPITOR) 40 MG tablet  Take 1 tablet (40 mg total) by mouth daily.   celecoxib (CELEBREX) 100 MG capsule Take 1 capsule (100 mg total) by mouth daily.   cloNIDine (CATAPRES) 0.2 MG tablet Take 1 tablet (0.2 mg total) by mouth 2 (two) times daily as needed.   cyanocobalamin (VITAMIN B12) 1000 MCG tablet Take 1 tablet (1,000 mcg total) by mouth daily.   hydrochlorothiazide (HYDRODIURIL) 25 MG tablet Take 1 tablet (25 mg total) by mouth daily.   olmesartan (BENICAR) 20 MG tablet Take 1 tablet (20 mg total) by mouth daily.   oxybutynin (DITROPAN-XL) 5 MG 24 hr tablet Take 1 tablet (5 mg total) by mouth at bedtime.   pramipexole (MIRAPEX) 0.25 MG tablet Take 1 tablet (0.25 mg total) by mouth at bedtime. (Patient taking differently: Take 0.25 mg by mouth as needed (restless Right Leg).)   Vitamin D, Ergocalciferol, (DRISDOL) 1.25 MG (50000 UNIT) CAPS capsule Take 1 capsule (50,000 Units total) by mouth every 7 (seven) days.     Allergies:   Prednisone, Statins, Zetia [ezetimibe], and No known allergies   Social History   Socioeconomic History   Marital status: Married    Spouse name: Not on file   Number of children: Not on file   Years of education: Not on file   Highest education level: Not on file  Occupational History   Not on file  Tobacco Use  Smoking status: Never   Smokeless tobacco: Never  Vaping Use   Vaping status: Never Used  Substance and Sexual Activity   Alcohol use: Yes    Comment: Drinks on a social basis.   Drug use: No   Sexual activity: Not on file  Other Topics Concern   Not on file  Social History Narrative   Not on file   Social Determinants of Health   Financial Resource Strain: Low Risk  (03/31/2023)   Overall Financial Resource Strain (CARDIA)    Difficulty of Paying Living Expenses: Not hard at all  Food Insecurity: No Food Insecurity (03/31/2023)   Hunger Vital Sign    Worried About Running Out of Food in the Last Year: Never true    Ran Out of Food in the Last Year: Never  true  Transportation Needs: No Transportation Needs (03/31/2023)   PRAPARE - Administrator, Civil Service (Medical): No    Lack of Transportation (Non-Medical): No  Physical Activity: Sufficiently Active (03/31/2023)   Exercise Vital Sign    Days of Exercise per Week: 5 days    Minutes of Exercise per Session: 60 min  Stress: No Stress Concern Present (03/31/2023)   [employer] of Occupational Health - Occupational Stress Questionnaire    Feeling of Stress : Not at all  Social Connections: Socially Integrated (03/31/2023)   Social Connection and Isolation Panel [NHANES]    Frequency of Communication with Friends and Family: More than three times a week    Frequency of Social Gatherings with Friends and Family: More than three times a week    Attends Religious Services: More than 4 times per year    Active Member of [employer] or Organizations: Yes    Attends Engineer, structural: More than 4 times per year    Marital Status: Married     Family History: The patient's family history includes Cerebrovascular Accident in an other family member; Diabetes Mellitus II in an other family member; Hyperlipidemia in an other family member; Hypertension in an other family member; Stroke in her father. ROS:   Please see the history of present illness.    All 14 point review of systems negative except as described per history of present illness  EKGs/Labs/Other Studies Reviewed:         Recent Labs: 12/18/2022: TSH 2.530 04/21/2023: ALT 17; BUN 32; Creatinine, Ser 1.27; Hemoglobin 13.3; Platelets 354; Potassium 5.7; Sodium 140  Recent Lipid Panel    Component Value Date/Time   CHOL 209 (H) 04/21/2023 0801   TRIG 175 (H) 04/21/2023 0801   HDL 58 04/21/2023 0801   CHOLHDL 3.6 04/21/2023 0801   LDLCALC 120 (H) 04/21/2023 0801    Physical Exam:    VS:  BP (!) 142/76 (BP Location: Left Arm, Patient Position: Sitting)   Pulse 76   Ht 5\' 10"  (1.778 m)   Wt 191 lb 3.7 oz  (86.7 kg)   SpO2 92%   BMI 27.44 kg/m     Wt Readings from Last 3 Encounters:  06/05/23 191 lb 3.7 oz (86.7 kg)  06/02/23 196 lb 6.4 oz (89.1 kg)  06/02/23 196 lb (88.9 kg)     GEN:  Well nourished, well developed in no acute distress HEENT: Normal NECK: No JVD; No carotid bruits LYMPHATICS: No lymphadenopathy CARDIAC: RRR, no murmurs, no rubs, no gallops RESPIRATORY:  Clear to auscultation without rales, wheezing or rhonchi  ABDOMEN: Soft, non-tender, non-distended MUSCULOSKELETAL:  No edema; No deformity  SKIN: Warm and dry LOWER EXTREMITIES: no swelling NEUROLOGIC:  Alert and oriented x 3 PSYCHIATRIC:  Normal affect   ASSESSMENT:    1. Dyspnea on exertion   2. Essential hypertension, benign   3. Mixed hyperlipidemia   4. Generalized anxiety disorder    PLAN:    In order of problems listed above:  Dyspnea exertion echocardiogram is pending. Essential hypertension slightly better but still required some adjustment.  I will ask him to have a Chem-7 today.  If Chem-7 okay will double the dose of Benicar. Dyslipidemia will address this concern after echocardiogram and renal ultrasound will be done   Medication Adjustments/Labs and Tests Ordered: Current medicines are reviewed at length with the patient today.  Concerns regarding medicines are outlined above.  Orders Placed This Encounter  Procedures   Basic metabolic panel   Medication changes: No orders of the defined types were placed in this encounter.   Signed, Georgeanna Lea, MD, [facility] 06/05/2023 11:55 AM    [facility]

## 2023-06-05 NOTE — Patient Instructions (Signed)
Medication Instructions:  Your physician recommends that you continue on your current medications as directed. Please refer to the Current Medication list given to you today.  *If you need a refill on your cardiac medications before your next appointment, please call your pharmacy*   Lab Work: BMP- today If you have labs (blood work) drawn today and your tests are completely normal, you will receive your results only by: MyChart Message (if you have MyChart) OR A paper copy in the mail If you have any lab test that is abnormal or we need to change your treatment, we will call you to review the results.   Testing/Procedures: None Ordered   Follow-Up: At [facility], you and your health needs are our priority.  As part of our continuing mission to provide you with exceptional heart care, we have created designated Provider Care Teams.  These Care Teams include your primary Cardiologist (physician) and Advanced Practice Providers (APPs -  Physician Assistants and Nurse Practitioners) who all work together to provide you with the care you need, when you need it.  We recommend signing up for the patient portal called "MyChart".  Sign up information is provided on this After Visit Summary.  MyChart is used to connect with patients for Virtual Visits (Telemedicine).  Patients are able to view lab/test results, encounter notes, upcoming appointments, etc.  Non-urgent messages can be sent to your provider as well.   To learn more about what you can do with MyChart, go to [email or web address].    Your next appointment:   3 week(s)  The format for your next appointment:   In Person  Provider:   Robert Krasowski, MD    Other Instructions NA  

## 2023-06-06 LAB — BASIC METABOLIC PANEL
BUN/Creatinine Ratio: 22 (ref 12–28)
BUN: 23 mg/dL (ref 8–27)
CO2: 22 mmol/L (ref 20–29)
Calcium: 9.7 mg/dL (ref 8.7–10.3)
Chloride: 98 mmol/L (ref 96–106)
Creatinine, Ser: 1.06 mg/dL — ABNORMAL HIGH (ref 0.57–1.00)
Glucose: 135 mg/dL — ABNORMAL HIGH (ref 70–99)
Potassium: 4.3 mmol/L (ref 3.5–5.2)
Sodium: 138 mmol/L (ref 134–144)
eGFR: 55 mL/min/{1.73_m2} — ABNORMAL LOW (ref >59)

## 2023-06-08 ENCOUNTER — Other Ambulatory Visit: Payer: Self-pay | Admitting: Physician Assistant

## 2023-06-08 DIAGNOSIS — M1991 Primary osteoarthritis, unspecified site: Secondary | ICD-10-CM

## 2023-06-16 ENCOUNTER — Telehealth: Payer: Self-pay

## 2023-06-16 NOTE — Telephone Encounter (Signed)
Patient notified of results and recommendations and agreed with plan. Patient confirmed has started Benicar. Lab order on file.

## 2023-06-16 NOTE — Telephone Encounter (Signed)
-----   Message from Gypsy Balsam sent at 06/12/2023 12:44 PM EDT ----- Please Benicar to 40 mg daily, Chem-7 need to be done next week

## 2023-06-22 ENCOUNTER — Telehealth: Payer: Self-pay

## 2023-06-22 DIAGNOSIS — I10 Essential (primary) hypertension: Secondary | ICD-10-CM

## 2023-06-22 NOTE — Telephone Encounter (Signed)
BMP per Dr. Vanetta Shawl note

## 2023-06-23 ENCOUNTER — Other Ambulatory Visit: Payer: Self-pay | Admitting: Physician Assistant

## 2023-06-23 DIAGNOSIS — I10 Essential (primary) hypertension: Secondary | ICD-10-CM

## 2023-06-23 LAB — BASIC METABOLIC PANEL
BUN/Creatinine Ratio: 22 (ref 12–28)
BUN: 21 mg/dL (ref 8–27)
CO2: 23 mmol/L (ref 20–29)
Calcium: 10 mg/dL (ref 8.7–10.3)
Chloride: 101 mmol/L (ref 96–106)
Creatinine, Ser: 0.95 mg/dL (ref 0.57–1.00)
Glucose: 100 mg/dL — ABNORMAL HIGH (ref 70–99)
Potassium: 4.6 mmol/L (ref 3.5–5.2)
Sodium: 138 mmol/L (ref 134–144)
eGFR: 63 mL/min/{1.73_m2} (ref >59)

## 2023-06-26 ENCOUNTER — Ambulatory Visit: Payer: Medicare Other | Attending: Cardiology | Admitting: Cardiology

## 2023-06-26 ENCOUNTER — Encounter: Payer: Self-pay | Admitting: Cardiology

## 2023-06-26 VITALS — BP 108/68 | HR 67 | Ht 70.0 in | Wt 189.2 lb

## 2023-06-26 DIAGNOSIS — I10 Essential (primary) hypertension: Secondary | ICD-10-CM

## 2023-06-26 DIAGNOSIS — I87303 Chronic venous hypertension (idiopathic) without complications of bilateral lower extremity: Secondary | ICD-10-CM

## 2023-06-26 DIAGNOSIS — M1711 Unilateral primary osteoarthritis, right knee: Secondary | ICD-10-CM

## 2023-06-26 NOTE — Patient Instructions (Signed)
Medication Instructions:  Your physician recommends that you continue on your current medications as directed. Please refer to the Current Medication list given to you today.  *If you need a refill on your cardiac medications before your next appointment, please call your pharmacy*   Lab Work: None Ordered If you have labs (blood work) drawn today and your tests are completely normal, you will receive your results only by: [place] (if you have MyChart) OR A paper copy in the mail If you have any lab test that is abnormal or we need to change your treatment, we will call you to review the results.   Testing/Procedures: None Ordered   Follow-Up: At [facility], you and your health needs are our priority.  As part of our continuing mission to provide you with exceptional heart care, we have created designated Provider Care Teams.  These Care Teams include your primary Cardiologist (physician) and Advanced Practice Providers (APPs -  Physician Assistants and Nurse Practitioners) who all work together to provide you with the care you need, when you need it.  We recommend signing up for the patient portal called "MyChart".  Sign up information is provided on this After Visit Summary.  MyChart is used to connect with patients for Virtual Visits (Telemedicine).  Patients are able to view lab/test results, encounter notes, upcoming appointments, etc.  Non-urgent messages can be sent to your provider as well.   To learn more about what you can do with MyChart, go to [email or web address].    Your next appointment:   6 week(s)  The format for your next appointment:   In Person  Provider:   Jenne Campus, MD    Other Instructions NA

## 2023-06-26 NOTE — Progress Notes (Addendum)
Cardiology Office Note:    Date:  06/26/2023   ID:  Susan Roberson, DOB December 13, 1950, MRN [id number]  PCP:  Langley Gauss, PA  Cardiologist:  Gypsy Balsam, MD    Referring MD: Langley Gauss, [place]   No chief complaint on file.   History of Present Illness:    Susan Roberson is a 72 y.o. female with past medical history significant for essential hypertension which appears to be difficult to control, dyspnea exertion, anxiety she was referred to [place] for helping to control her blood pressure she also had some chest pain CT chest has been done which showed no evidence of PE minimal atherosclerosis she did have however some enlargement of the healer lymph nodes significance unclear there was also some calcification of the liver noted recommendation was to perform ultrasounds of an outpatient basis.  She comes today to my office for follow-up to help with the blood pressure and blood pressures interesting because today is low she show me some numbers from home it were elevated for last 2 days and today for some reason is low.  She feels elderly weak tired but no other symptoms.  She is scheduled to have renal ultrasound for assessment of renal arteries on Monday with echocardiogram we will wait for it.  Past Medical History:  Diagnosis Date   Arthritis    Cancer (HCC)    pre cancerous skin cancer with multiple lesions removed   Complication of anesthesia    broken teeth   Essential (primary) hypertension    Generalized anxiety disorder    Hyperkalemia    Hypertension     Past Surgical History:  Procedure Laterality Date   SKIN CANCER EXCISION Bilateral    legs   TOTAL HIP ARTHROPLASTY Right    5 yrs ago   TOTAL KNEE ARTHROPLASTY Right 03/10/2016   Procedure: RIGHT TOTAL KNEE ARTHROPLASTY;  Surgeon: Dannielle Huh, MD;  Location: [facility] OR;  Service: Orthopedics;  Laterality: Right;    Current Medications: Current Meds  Medication Sig   atorvastatin (LIPITOR) 40 MG tablet Take 1 tablet (40 mg  total) by mouth daily.   benazepril (LOTENSIN) 40 MG tablet Take 40 mg by mouth daily.   celecoxib (CELEBREX) 100 MG capsule Take 1 capsule (100 mg total) by mouth daily.   cloNIDine (CATAPRES) 0.2 MG tablet Take 1 tablet (0.2 mg total) by mouth 2 (two) times daily as needed. (Patient taking differently: Take 0.2 mg by mouth 2 (two) times daily as needed (BP).)   cyanocobalamin (VITAMIN B12) 1000 MCG tablet Take 1 tablet (1,000 mcg total) by mouth daily.   hydrochlorothiazide (HYDRODIURIL) 25 MG tablet Take 1 tablet (25 mg total) by mouth daily.   Vitamin D, Ergocalciferol, (DRISDOL) 1.25 MG (50000 UNIT) CAPS capsule Take 1 capsule by mouth every 7 (seven) days. (Patient taking differently: Take 50,000 Units by mouth every 7 (seven) days.)   [DISCONTINUED] olmesartan (BENICAR) 20 MG tablet Take 1 tablet (20 mg total) by mouth daily.   [DISCONTINUED] oxybutynin (DITROPAN-XL) 5 MG 24 hr tablet Take 1 tablet (5 mg total) by mouth at bedtime.   [DISCONTINUED] pramipexole (MIRAPEX) 0.25 MG tablet Take 1 tablet (0.25 mg total) by mouth at bedtime. (Patient taking differently: Take 0.25 mg by mouth as needed (restless Right Leg).)     Allergies:   Prednisone, Statins, Zetia [ezetimibe], and No known allergies   Social History   Socioeconomic History   Marital status: Married    Spouse name: Not on file   Number  of children: Not on file   Years of education: Not on file   Highest education level: Not on file  Occupational History   Not on file  Tobacco Use   Smoking status: Never   Smokeless tobacco: Never  Vaping Use   Vaping status: Never Used  Substance and Sexual Activity   Alcohol use: Yes    Comment: Drinks on a social basis.   Drug use: No   Sexual activity: Not on file  Other Topics Concern   Not on file  Social History Narrative   Not on file   Social Determinants of Health   Financial Resource Strain: Low Risk  (03/31/2023)   Overall Financial Resource Strain (CARDIA)     Difficulty of Paying Living Expenses: Not hard at all  Food Insecurity: No Food Insecurity (03/31/2023)   Hunger Vital Sign    Worried About Running Out of Food in the Last Year: Never true    Ran Out of Food in the Last Year: Never true  Transportation Needs: No Transportation Needs (03/31/2023)   PRAPARE - Administrator, Civil Service (Medical): No    Lack of Transportation (Non-Medical): No  Physical Activity: Sufficiently Active (03/31/2023)   Exercise Vital Sign    Days of Exercise per Week: 5 days    Minutes of Exercise per Session: 60 min  Stress: No Stress Concern Present (03/31/2023)   [employer] of Occupational Health - Occupational Stress Questionnaire    Feeling of Stress : Not at all  Social Connections: Socially Integrated (03/31/2023)   Social Connection and Isolation Panel [NHANES]    Frequency of Communication with Friends and Family: More than three times a week    Frequency of Social Gatherings with Friends and Family: More than three times a week    Attends Religious Services: More than 4 times per year    Active Member of [employer] or Organizations: Yes    Attends Engineer, structural: More than 4 times per year    Marital Status: Married     Family History: The patient's family history includes Cerebrovascular Accident in an other family member; Diabetes Mellitus II in an other family member; Hyperlipidemia in an other family member; Hypertension in an other family member; Stroke in her father. ROS:   Please see the history of present illness.    All 14 point review of systems negative except as described per history of present illness  EKGs/Labs/Other Studies Reviewed:         Recent Labs: 12/18/2022: TSH 2.530 04/21/2023: ALT 17; Hemoglobin 13.3; Platelets 354 06/22/2023: BUN 21; Creatinine, Ser 0.95; Potassium 4.6; Sodium 138  Recent Lipid Panel    Component Value Date/Time   CHOL 209 (H) 04/21/2023 0801   TRIG 175 (H) 04/21/2023  0801   HDL 58 04/21/2023 0801   CHOLHDL 3.6 04/21/2023 0801   LDLCALC 120 (H) 04/21/2023 0801    Physical Exam:    VS:  BP 108/68   Pulse 67   Ht 5\' 10"  (1.778 m)   Wt 189 lb 3.2 oz (85.8 kg)   SpO2 97%   BMI 27.15 kg/m     Wt Readings from Last 3 Encounters:  06/26/23 189 lb 3.2 oz (85.8 kg)  06/05/23 191 lb 3.7 oz (86.7 kg)  06/02/23 196 lb 6.4 oz (89.1 kg)     GEN:  Well nourished, well developed in no acute distress HEENT: Normal NECK: No JVD; No carotid bruits LYMPHATICS: No lymphadenopathy CARDIAC:  RRR, no murmurs, no rubs, no gallops RESPIRATORY:  Clear to auscultation without rales, wheezing or rhonchi  ABDOMEN: Soft, non-tender, non-distended MUSCULOSKELETAL:  No edema; No deformity  SKIN: Warm and dry LOWER EXTREMITIES: no swelling NEUROLOGIC:  Alert and oriented x 3 PSYCHIATRIC:  Normal affect   ASSESSMENT:    1. Essential hypertension, benign   2. Primary osteoarthritis of right knee   3. 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  She got low blood pressure today.  Will give her some fluids.  Orally, I asked her to return to my office in about a month to 6 weeks.  She is scheduled to have renal duplex to check for renal artery stenosis. Osteoarthritis.  Noted stable. Venous insufficiency, stable. Patient to keep, we are waiting ultrasounds have her do ultrasounds we do his vascular ultrasound.  She also may require just a regular kidney ultrasound after   Medication Adjustments/Labs and Tests Ordered: Current medicines are reviewed at length with the patient today.  Concerns regarding medicines are outlined above.  No orders of the defined types were placed in this encounter.  Medication changes: No orders of the defined types were placed in this encounter.   Signed, Georgeanna Lea, MD, [facility] 06/26/2023 10:01  AM    [facility]

## 2023-06-29 ENCOUNTER — Ambulatory Visit (INDEPENDENT_AMBULATORY_CARE_PROVIDER_SITE_OTHER): Payer: Medicare Other

## 2023-06-29 ENCOUNTER — Ambulatory Visit: Payer: Medicare Other | Attending: Cardiology

## 2023-06-29 ENCOUNTER — Encounter: Payer: Self-pay | Admitting: Cardiology

## 2023-06-29 DIAGNOSIS — R0609 Other forms of dyspnea: Secondary | ICD-10-CM | POA: Diagnosis not present

## 2023-06-29 DIAGNOSIS — I10 Essential (primary) hypertension: Secondary | ICD-10-CM

## 2023-06-29 LAB — ECHOCARDIOGRAM COMPLETE: S' Lateral: 2.8 cm

## 2023-06-29 MED ORDER — PERFLUTREN LIPID MICROSPHERE
1.0000 mL | INTRAVENOUS | Status: AC | PRN
Start: 2023-06-29 — End: 2023-06-29
  Administered 2023-06-29: 3 mL via INTRAVENOUS

## 2023-07-01 ENCOUNTER — Encounter: Payer: Self-pay | Admitting: Cardiology

## 2023-07-02 ENCOUNTER — Telehealth: Payer: Self-pay

## 2023-07-02 NOTE — Telephone Encounter (Signed)
Patient notified of results and verbalized understanding.  

## 2023-07-02 NOTE — Telephone Encounter (Signed)
-----   Message from Gypsy Balsam sent at 07/02/2023 10:58 AM EST ----- Echocardiogram showed preserved ejection fraction, diastolic dysfunction meaning heart is a little stiff, this for medical therapy, everything else looks fine

## 2023-07-02 NOTE — Telephone Encounter (Signed)
-----   Message from Gypsy Balsam sent at 07/02/2023 10:56 AM EST ----- Renal ultrasound did not show any significant renal artery stenosis

## 2023-07-30 ENCOUNTER — Other Ambulatory Visit: Payer: Self-pay | Admitting: Physician Assistant

## 2023-07-30 DIAGNOSIS — E782 Mixed hyperlipidemia: Secondary | ICD-10-CM

## 2023-08-07 ENCOUNTER — Ambulatory Visit: Payer: Medicare Other | Attending: Cardiology | Admitting: Cardiology

## 2023-08-07 ENCOUNTER — Encounter: Payer: Self-pay | Admitting: Cardiology

## 2023-08-07 VITALS — BP 142/74 | HR 72 | Ht 70.0 in | Wt 194.0 lb

## 2023-08-07 DIAGNOSIS — R739 Hyperglycemia, unspecified: Secondary | ICD-10-CM

## 2023-08-07 DIAGNOSIS — I10 Essential (primary) hypertension: Secondary | ICD-10-CM | POA: Diagnosis not present

## 2023-08-07 DIAGNOSIS — R0609 Other forms of dyspnea: Secondary | ICD-10-CM | POA: Diagnosis not present

## 2023-08-07 NOTE — Addendum Note (Signed)
Addended by: Baldo Ash D on: 08/07/2023 09:33 AM   Modules accepted: Orders

## 2023-08-07 NOTE — Patient Instructions (Signed)
Medication Instructions:  Your physician recommends that you continue on your current medications as directed. Please refer to the Current Medication list given to you today.  *If you need a refill on your cardiac medications before your next appointment, please call your pharmacy*   Lab Work: None Ordered If you have labs (blood work) drawn today and your tests are completely normal, you will receive your results only by: MyChart Message (if you have MyChart) OR A paper copy in the mail If you have any lab test that is abnormal or we need to change your treatment, we will call you to review the results.   Testing/Procedures: None Ordered   Follow-Up: At [facility], you and your health needs are our priority.  As part of our continuing mission to provide you with exceptional heart care, we have created designated Provider Care Teams.  These Care Teams include your primary Cardiologist (physician) and Advanced Practice Providers (APPs -  Physician Assistants and Nurse Practitioners) who all work together to provide you with the care you need, when you need it.  We recommend signing up for the patient portal called "MyChart".  Sign up information is provided on this After Visit Summary.  MyChart is used to connect with patients for Virtual Visits (Telemedicine).  Patients are able to view lab/test results, encounter notes, upcoming appointments, etc.  Non-urgent messages can be sent to your provider as well.   To learn more about what you can do with MyChart, go to [email or web address].    Your next appointment:   5 month(s)  The format for your next appointment:   In Person  Provider:   Robert Krasowski, MD    Other Instructions NA  

## 2023-08-07 NOTE — Progress Notes (Signed)
Cardiology Office Note:    Date:  08/07/2023   ID:  Tashaun Byes, DOB 10-29-1950, MRN [id number]  PCP:  Langley Gauss, PA  Cardiologist:  Gypsy Balsam, MD    Referring MD: Langley Gauss, [place]   Chief Complaint  Patient presents with   Follow-up    History of Present Illness:    Susan Roberson is a 72 y.o. female past medical history significant for essential hypertension which appears to be somewhat difficult to control, dyspnea on exertion anxiety she was referred to [place] for high blood pressure which was difficult to control.  CT angio of renal arteries did not show any stenosis.  Blood pressure is a little bit better controlled but still not perfect.  She started going to the gym and enjoying it.  Denies have any chest pain tightness squeezing pressure burning chest  Past Medical History:  Diagnosis Date   Arthritis    Cancer (HCC)    pre cancerous skin cancer with multiple lesions removed   Complication of anesthesia    broken teeth   Essential (primary) hypertension    Generalized anxiety disorder    Hyperkalemia    Hypertension     Past Surgical History:  Procedure Laterality Date   SKIN CANCER EXCISION Bilateral    legs   TOTAL HIP ARTHROPLASTY Right    5 yrs ago   TOTAL KNEE ARTHROPLASTY Right 03/10/2016   Procedure: RIGHT TOTAL KNEE ARTHROPLASTY;  Surgeon: Dannielle Huh, MD;  Location: [facility] OR;  Service: Orthopedics;  Laterality: Right;    Current Medications: Current Meds  Medication Sig   atorvastatin (LIPITOR) 40 MG tablet Take 1 tablet (40 mg total) by mouth daily.   benazepril (LOTENSIN) 40 MG tablet Take 40 mg by mouth daily.   celecoxib (CELEBREX) 100 MG capsule Take 1 capsule (100 mg total) by mouth daily.   hydrochlorothiazide (HYDRODIURIL) 25 MG tablet Take 1 tablet (25 mg total) by mouth daily.   olmesartan (BENICAR) 20 MG tablet Take 20 mg by mouth daily.   Vitamin D, Ergocalciferol, (DRISDOL) 1.25 MG (50000 UNIT) CAPS capsule Take 1 capsule by mouth  every 7 (seven) days. (Patient taking differently: Take 50,000 Units by mouth every 7 (seven) days.)   [DISCONTINUED] cloNIDine (CATAPRES) 0.2 MG tablet Take 1 tablet (0.2 mg total) by mouth 2 (two) times daily as needed. (Patient taking differently: Take 0.2 mg by mouth 2 (two) times daily as needed (BP).)   [DISCONTINUED] cyanocobalamin (VITAMIN B12) 1000 MCG tablet Take 1 tablet (1,000 mcg total) by mouth daily.   [DISCONTINUED] olmesartan (BENICAR) 20 MG tablet Take 20 mg by mouth daily.     Allergies:   Prednisone, Statins, Zetia [ezetimibe], and No known allergies   Social History   Socioeconomic History   Marital status: Married    Spouse name: Not on file   Number of children: Not on file   Years of education: Not on file   Highest education level: Not on file  Occupational History   Not on file  Tobacco Use   Smoking status: Never   Smokeless tobacco: Never  Vaping Use   Vaping status: Never Used  Substance and Sexual Activity   Alcohol use: Yes    Comment: Drinks on a social basis.   Drug use: No   Sexual activity: Not on file  Other Topics Concern   Not on file  Social History Narrative   Not on file   Social Drivers of Health   Financial Resource Strain: Low  Risk  (03/31/2023)   Overall Financial Resource Strain (CARDIA)    Difficulty of Paying Living Expenses: Not hard at all  Food Insecurity: No Food Insecurity (03/31/2023)   Hunger Vital Sign    Worried About Running Out of Food in the Last Year: Never true    Ran Out of Food in the Last Year: Never true  Transportation Needs: No Transportation Needs (03/31/2023)   PRAPARE - Administrator, Civil Service (Medical): No    Lack of Transportation (Non-Medical): No  Physical Activity: Sufficiently Active (03/31/2023)   Exercise Vital Sign    Days of Exercise per Week: 5 days    Minutes of Exercise per Session: 60 min  Stress: No Stress Concern Present (03/31/2023)   [employer] of Occupational  Health - Occupational Stress Questionnaire    Feeling of Stress : Not at all  Social Connections: Socially Integrated (03/31/2023)   Social Connection and Isolation Panel [NHANES]    Frequency of Communication with Friends and Family: More than three times a week    Frequency of Social Gatherings with Friends and Family: More than three times a week    Attends Religious Services: More than 4 times per year    Active Member of [employer] or Organizations: Yes    Attends Engineer, structural: More than 4 times per year    Marital Status: Married     Family History: The patient's family history includes Cerebrovascular Accident in an other family member; Diabetes Mellitus II in an other family member; Hyperlipidemia in an other family member; Hypertension in an other family member; Stroke in her father. ROS:   Please see the history of present illness.    All 14 point review of systems negative except as described per history of present illness  EKGs/Labs/Other Studies Reviewed:         Recent Labs: 12/18/2022: TSH 2.530 04/21/2023: ALT 17; Hemoglobin 13.3; Platelets 354 06/22/2023: BUN 21; Creatinine, Ser 0.95; Potassium 4.6; Sodium 138  Recent Lipid Panel    Component Value Date/Time   CHOL 209 (H) 04/21/2023 0801   TRIG 175 (H) 04/21/2023 0801   HDL 58 04/21/2023 0801   CHOLHDL 3.6 04/21/2023 0801   LDLCALC 120 (H) 04/21/2023 0801    Physical Exam:    VS:  BP (!) 142/74 (BP Location: Right Arm, Patient Position: Sitting)   Pulse 72   Ht 5\' 10"  (1.778 m)   Wt 194 lb (88 kg)   SpO2 97%   BMI 27.84 kg/m     Wt Readings from Last 3 Encounters:  08/07/23 194 lb (88 kg)  06/26/23 189 lb 3.2 oz (85.8 kg)  06/05/23 191 lb 3.7 oz (86.7 kg)     GEN:  Well nourished, well developed in no acute distress HEENT: Normal NECK: No JVD; No carotid bruits LYMPHATICS: No lymphadenopathy CARDIAC: RRR, no murmurs, no rubs, no gallops RESPIRATORY:  Clear to auscultation without  rales, wheezing or rhonchi  ABDOMEN: Soft, non-tender, non-distended MUSCULOSKELETAL:  No edema; No deformity  SKIN: Warm and dry LOWER EXTREMITIES: no swelling NEUROLOGIC:  Alert and oriented x 3 PSYCHIATRIC:  Normal affect   ASSESSMENT:    1. Essential hypertension, benign   2. Hyperglycemia   3. Dyspnea on exertion    PLAN:    In order of problems listed above:  Essential hypertension still not well-controlled will get Chem-7 if Chem-7 is fine we will double dose of Benicar. Hyperglycemia that is followed by antimedicine team. Dyspnea  on exertion much better after she started exercising on the regular basis   Medication Adjustments/Labs and Tests Ordered: Current medicines are reviewed at length with the patient today.  Concerns regarding medicines are outlined above.  No orders of the defined types were placed in this encounter.  Medication changes: No orders of the defined types were placed in this encounter.   Signed, Georgeanna Lea, MD, [facility] 08/07/2023 9:20 AM    [facility]

## 2023-08-08 LAB — BASIC METABOLIC PANEL
BUN/Creatinine Ratio: 27 (ref 12–28)
BUN: 23 mg/dL (ref 8–27)
CO2: 23 mmol/L (ref 20–29)
Calcium: 10 mg/dL (ref 8.7–10.3)
Chloride: 102 mmol/L (ref 96–106)
Creatinine, Ser: 0.86 mg/dL (ref 0.57–1.00)
Glucose: 89 mg/dL (ref 70–99)
Potassium: 4.6 mmol/L (ref 3.5–5.2)
Sodium: 140 mmol/L (ref 134–144)
eGFR: 71 mL/min/{1.73_m2} (ref >59)

## 2023-08-10 ENCOUNTER — Telehealth: Payer: Self-pay

## 2023-08-10 DIAGNOSIS — I10 Essential (primary) hypertension: Secondary | ICD-10-CM

## 2023-08-10 MED ORDER — OLMESARTAN MEDOXOMIL 40 MG PO TABS: 40.0000 mg | ORAL_TABLET | Freq: Every day | ORAL | 3 refills | Status: DC

## 2023-08-10 NOTE — Telephone Encounter (Signed)
Spoke with pt. She stated that she was not at home and would check her bottle and make sure she is taking Benicar 40mg  if not she will increase to 40mg  and come for labs in 1 week.

## 2023-08-10 NOTE — Addendum Note (Signed)
Addended by: Baldo Ash D on: 08/10/2023 11:14 AM   Modules accepted: Orders

## 2023-08-11 NOTE — Progress Notes (Unsigned)
Subjective:  Patient ID: Susan Roberson, female    DOB: 1950-10-23  Age: 72 y.o. MRN: [id number]  Chief Complaint  Patient presents with   Medical Management of Chronic Issues    HPI   Uncontrolled hypertension: Patient is taking hydrochlorothiazide 25 mg daily, Olmesartan 40 mg daily.  Hyperlipidemia: On atorvastatin 40 mg daily.     05/12/2023    7:45 AM 04/21/2023    7:38 AM 03/31/2023   10:38 AM 01/16/2023    7:50 AM 01/17/2022   10:06 AM  Depression screen PHQ 2/9  Decreased Interest 0 0 0 0 0  Down, Depressed, Hopeless 0 0 0 0 0  PHQ - 2 Score 0 0 0 0 0  Altered sleeping 1 1  0   Tired, decreased energy 0 0  0   Change in appetite 0 0  0   Feeling bad or failure about yourself  0 0  0   Trouble concentrating 0 0  0   Moving slowly or fidgety/restless 0 0  0   Suicidal thoughts 0 0  0   PHQ-9 Score 1 1  0   Difficult doing work/chores Not difficult at all Not difficult at all  Not difficult at all         04/21/2023    7:38 AM  Fall Risk   Falls in the past year? 0  Number falls in past yr: 0  Injury with Fall? 0  Risk for fall due to : No Fall Risks  Follow up Falls evaluation completed    Patient Care Team: Langley Gauss, [place] as PCP - General (Physician Assistant)   Review of Systems  Current Outpatient Medications on File Prior to Visit  Medication Sig Dispense Refill   atorvastatin (LIPITOR) 40 MG tablet Take 1 tablet (40 mg total) by mouth daily. 90 tablet 0   celecoxib (CELEBREX) 100 MG capsule Take 1 capsule (100 mg total) by mouth daily. 90 capsule 0   hydrochlorothiazide (HYDRODIURIL) 25 MG tablet Take 1 tablet (25 mg total) by mouth daily. 90 tablet 3   olmesartan (BENICAR) 40 MG tablet Take 1 tablet (40 mg total) by mouth daily. 90 tablet 3   Vitamin D, Ergocalciferol, (DRISDOL) 1.25 MG (50000 UNIT) CAPS capsule Take 1 capsule by mouth every 7 (seven) days. (Patient taking differently: Take 50,000 Units by mouth every 7 (seven) days.) 5 capsule 2   No  current facility-administered medications on file prior to visit.   Past Medical History:  Diagnosis Date   Arthritis    Cancer (HCC)    pre cancerous skin cancer with multiple lesions removed   Complication of anesthesia    broken teeth   Essential (primary) hypertension    Generalized anxiety disorder    Hyperkalemia    Hypertension    Past Surgical History:  Procedure Laterality Date   SKIN CANCER EXCISION Bilateral    legs   TOTAL HIP ARTHROPLASTY Right    5 yrs ago   TOTAL KNEE ARTHROPLASTY Right 03/10/2016   Procedure: RIGHT TOTAL KNEE ARTHROPLASTY;  Surgeon: Dannielle Huh, MD;  Location: [facility] OR;  Service: Orthopedics;  Laterality: Right;    Family History  Problem Relation Age of Onset   Stroke Father    Cerebrovascular Accident Other    Diabetes Mellitus II Other    Hyperlipidemia Other    Hypertension Other    Social History   Socioeconomic History   Marital status: Married    Spouse name: Not on  file   Number of children: Not on file   Years of education: Not on file   Highest education level: Not on file  Occupational History   Not on file  Tobacco Use   Smoking status: Never   Smokeless tobacco: Never  Vaping Use   Vaping status: Never Used  Substance and Sexual Activity   Alcohol use: Yes    Comment: Drinks on a social basis.   Drug use: No   Sexual activity: Not on file  Other Topics Concern   Not on file  Social History Narrative   Not on file   Social Drivers of Health   Financial Resource Strain: Low Risk  (03/31/2023)   Overall Financial Resource Strain (CARDIA)    Difficulty of Paying Living Expenses: Not hard at all  Food Insecurity: No Food Insecurity (03/31/2023)   Hunger Vital Sign    Worried About Running Out of Food in the Last Year: Never true    Ran Out of Food in the Last Year: Never true  Transportation Needs: No Transportation Needs (03/31/2023)   PRAPARE - Administrator, Civil Service (Medical): No    Lack of  Transportation (Non-Medical): No  Physical Activity: Sufficiently Active (03/31/2023)   Exercise Vital Sign    Days of Exercise per Week: 5 days    Minutes of Exercise per Session: 60 min  Stress: No Stress Concern Present (03/31/2023)   [employer] of Occupational Health - Occupational Stress Questionnaire    Feeling of Stress : Not at all  Social Connections: Socially Integrated (03/31/2023)   Social Connection and Isolation Panel [NHANES]    Frequency of Communication with Friends and Family: More than three times a week    Frequency of Social Gatherings with Friends and Family: More than three times a week    Attends Religious Services: More than 4 times per year    Active Member of [employer] or Organizations: Yes    Attends Engineer, structural: More than 4 times per year    Marital Status: Married    Objective:  There were no vitals taken for this visit.     08/07/2023    8:54 AM 06/26/2023    9:43 AM 06/05/2023   11:23 AM  BP/Weight  Systolic BP 142 108 142  Diastolic BP 74 68 76  Wt. (Lbs) 194 189.2 191.23  BMI 27.84 kg/m2 27.15 kg/m2 27.44 kg/m2    Physical Exam  Diabetic Foot Exam - Simple   No data filed      Lab Results  Component Value Date   WBC 7.6 04/21/2023   HGB 13.3 04/21/2023   HCT 40.8 04/21/2023   PLT 354 04/21/2023   GLUCOSE 89 08/07/2023   CHOL 209 (H) 04/21/2023   TRIG 175 (H) 04/21/2023   HDL 58 04/21/2023   LDLCALC 120 (H) 04/21/2023   ALT 17 04/21/2023   AST 21 04/21/2023   NA 140 08/07/2023   K 4.6 08/07/2023   CL 102 08/07/2023   CREATININE 0.86 08/07/2023   BUN 23 08/07/2023   CO2 23 08/07/2023   TSH 2.530 12/18/2022   INR 0.91 02/28/2016   HGBA1C 6.2 (H) 04/21/2023      Assessment & Plan:    There are no diagnoses linked to this encounter.   No orders of the defined types were placed in this encounter.   No orders of the defined types were placed in this encounter.    Follow-up: No follow-ups on  file.   Clayborn Bigness I Leal-Borjas,acting as a scribe for Langley Gauss, PA.,have documented all relevant documentation on the behalf of Langley Gauss, PA,as directed by  Langley Gauss, PA while in the presence of Langley Gauss, [place].   An After Visit Summary was printed and given to the patient.  Langley Gauss, [place] [facility] [phone]

## 2023-08-13 ENCOUNTER — Ambulatory Visit (INDEPENDENT_AMBULATORY_CARE_PROVIDER_SITE_OTHER): Payer: Medicare Other | Admitting: Physician Assistant

## 2023-08-13 ENCOUNTER — Encounter: Payer: Self-pay | Admitting: Physician Assistant

## 2023-08-13 ENCOUNTER — Telehealth: Payer: Self-pay | Admitting: Cardiology

## 2023-08-13 VITALS — BP 160/84 | HR 79 | Temp 97.9°F | Ht 70.0 in | Wt 190.6 lb

## 2023-08-13 DIAGNOSIS — G479 Sleep disorder, unspecified: Secondary | ICD-10-CM

## 2023-08-13 DIAGNOSIS — Z23 Encounter for immunization: Secondary | ICD-10-CM

## 2023-08-13 DIAGNOSIS — J0100 Acute maxillary sinusitis, unspecified: Secondary | ICD-10-CM | POA: Insufficient documentation

## 2023-08-13 DIAGNOSIS — I10 Essential (primary) hypertension: Secondary | ICD-10-CM | POA: Diagnosis not present

## 2023-08-13 DIAGNOSIS — R7303 Prediabetes: Secondary | ICD-10-CM

## 2023-08-13 DIAGNOSIS — E782 Mixed hyperlipidemia: Secondary | ICD-10-CM

## 2023-08-13 DIAGNOSIS — H9191 Unspecified hearing loss, right ear: Secondary | ICD-10-CM

## 2023-08-13 HISTORY — DX: Prediabetes: R73.03

## 2023-08-13 HISTORY — DX: Encounter for immunization: Z23

## 2023-08-13 HISTORY — DX: Acute maxillary sinusitis, unspecified: J01.00

## 2023-08-13 HISTORY — DX: Unspecified hearing loss, right ear: H91.91

## 2023-08-13 HISTORY — DX: Sleep disorder, unspecified: G47.9

## 2023-08-13 MED ORDER — AZITHROMYCIN 250 MG PO TABS: ORAL_TABLET | ORAL | 0 refills | Status: DC

## 2023-08-13 NOTE — Assessment & Plan Note (Signed)
Labs drawn at cardiologist appointment Will adjust treatment depending on results Continue monitoring diet and exercise

## 2023-08-13 NOTE — Patient Instructions (Signed)
VISIT SUMMARY:  During today's visit, we discussed your recent cold symptoms, ongoing management of high blood pressure, cholesterol levels, hearing loss, sleep issues, and macular degeneration. We also reviewed your general health maintenance needs.  YOUR PLAN:  -UPPER RESPIRATORY INFECTION: You have symptoms of a cold, including congestion and coughing, which may be due to a viral or bacterial infection. To cover the possibility of a bacterial infection, I have prescribed a Z-Pak (Azithromycin).  -HYPERTENSION: Your blood pressure remains elevated despite medication. Hypertension means high blood pressure, which can lead to serious health problems if not managed. Continue taking Benicar 40mg  daily, reduce your salt intake, and work on losing weight.  -HYPERLIPIDEMIA: Hyperlipidemia means high cholesterol levels, which can increase the risk of heart disease. We are waiting for your recent cholesterol test results and may consider Repatha injections if needed.  -HEARING LOSS: Your hearing loss has worsened, and you plan to see an audiologist in January. No changes to your current management are needed at this time.  -SLEEP DISTURBANCE: You are experiencing poor sleep quality and short sleep duration. Sleep disturbance can affect your overall health. We will discuss and potentially arrange a home sleep study at your next visit.  -MACULAR DEGENERATION: Macular degeneration is an eye condition that affects your vision. You are receiving regular injections from a retinal specialist, and the treatment is working well. No changes to your current management are needed.  -GENERAL HEALTH MAINTENANCE: We will administer any needed vaccines and plan a follow-up in 3 months to reassess your blood pressure control, discuss cholesterol test results, and consider starting Repatha if necessary.  INSTRUCTIONS:  Please follow up in 3 months to reassess your blood pressure control, discuss the results of your  cholesterol testing, and consider the potential initiation of Repatha. Continue with your current medications and lifestyle modifications as discussed.

## 2023-08-13 NOTE — Telephone Encounter (Signed)
Patient wants a call back to go over her medications and instructions.

## 2023-08-13 NOTE — Telephone Encounter (Signed)
Spoke to patient and went over medication recommendations per Dr. Vanetta Shawl note. Patient verbalized understanding and had no further questions.

## 2023-08-13 NOTE — Assessment & Plan Note (Signed)
Recent onset of congestion and cough, no fever or purulent sputum. Likely viral etiology, but given the patient's history of similar symptoms requiring antibiotic treatment, a bacterial infection cannot be ruled out. -Prescribe Z-Pak (Azithromycin) to cover potential bacterial infection.

## 2023-08-13 NOTE — Assessment & Plan Note (Signed)
Patient reports intolerance to statin therapy due to muscle aches and cramps. Pending cholesterol results from recent blood work. -Consider Repatha injection for cholesterol management depending on cholesterol results.

## 2023-08-13 NOTE — Assessment & Plan Note (Signed)
Reports of poor sleep quality and short sleep duration. Patient expressed interest in a home sleep study. -Plan to discuss and potentially arrange a home sleep study at the next visit.

## 2023-08-13 NOTE — Assessment & Plan Note (Signed)
Progressive hearing loss, worse with certain tones. Patient plans to see an audiologist. -No changes to current management.

## 2023-08-13 NOTE — Assessment & Plan Note (Signed)
Persistent elevated blood pressure readings despite medication (Benicar 40mg ). Patient is advised to reduce salt intake and lose weight to help manage blood pressure. -Continue Benicar 40mg  daily. -Encourage lifestyle modifications including reduced salt intake and weight loss.

## 2023-08-18 ENCOUNTER — Other Ambulatory Visit: Payer: Self-pay | Admitting: Physician Assistant

## 2023-08-18 DIAGNOSIS — M1991 Primary osteoarthritis, unspecified site: Secondary | ICD-10-CM

## 2023-08-18 LAB — BASIC METABOLIC PANEL
BUN/Creatinine Ratio: 22 (ref 12–28)
BUN: 20 mg/dL (ref 8–27)
CO2: 19 mmol/L — ABNORMAL LOW (ref 20–29)
Calcium: 9.9 mg/dL (ref 8.7–10.3)
Chloride: 96 mmol/L (ref 96–106)
Creatinine, Ser: 0.93 mg/dL (ref 0.57–1.00)
Glucose: 104 mg/dL — ABNORMAL HIGH (ref 70–99)
Potassium: 4.7 mmol/L (ref 3.5–5.2)
Sodium: 136 mmol/L (ref 134–144)
eGFR: 64 mL/min/{1.73_m2} (ref >59)

## 2023-08-21 ENCOUNTER — Telehealth: Payer: Self-pay | Admitting: Family Medicine

## 2023-08-21 ENCOUNTER — Other Ambulatory Visit: Payer: Self-pay | Admitting: Family Medicine

## 2023-08-21 MED ORDER — AMOXICILLIN-POT CLAVULANATE 875-125 MG PO TABS: 1.0000 | ORAL_TABLET | Freq: Two times a day (BID) | ORAL | 0 refills | Status: DC

## 2023-08-21 MED ORDER — CHLORTHALIDONE 25 MG PO TABS: 25.0000 mg | ORAL_TABLET | Freq: Every day | ORAL | 0 refills | Status: DC

## 2023-08-21 NOTE — Telephone Encounter (Signed)
 I saw patient's husband and Ms. Beverley is not improved since last week.  She continues to have sinus-like symptoms.  I sent Augmentin  for 10 days.  In addition her blood pressure still systolic 160s.  I recommended to change hydrochlorothiazide  to chlorthalidone  25 mg daily because it is more effective.  Continue your Benicar  40 mg daily.  Recommended she or her husband schedule follow-up with [facility] in 3 weeks for hypertension

## 2023-08-22 ENCOUNTER — Other Ambulatory Visit: Payer: Self-pay | Admitting: Physician Assistant

## 2023-08-22 DIAGNOSIS — M1991 Primary osteoarthritis, unspecified site: Secondary | ICD-10-CM

## 2023-08-23 ENCOUNTER — Other Ambulatory Visit: Payer: Self-pay | Admitting: Family Medicine

## 2023-08-23 DIAGNOSIS — R252 Cramp and spasm: Secondary | ICD-10-CM

## 2023-08-23 DIAGNOSIS — E538 Deficiency of other specified B group vitamins: Secondary | ICD-10-CM

## 2023-08-23 DIAGNOSIS — Z6826 Body mass index (BMI) 26.0-26.9, adult: Secondary | ICD-10-CM

## 2023-08-24 ENCOUNTER — Telehealth: Payer: Self-pay

## 2023-08-24 NOTE — Telephone Encounter (Signed)
 Left message on My Chart with normal lab results per Dr. Vanetta Shawl note. Routed to PCP.

## 2023-08-25 ENCOUNTER — Telehealth: Payer: Self-pay

## 2023-08-25 NOTE — Telephone Encounter (Signed)
Results reviewed with pt's spouse- per DPR- as per Dr. Krasowski's note.  Spouse verbalized understanding and had no additional questions. Routed to PCP  

## 2023-09-01 ENCOUNTER — Telehealth: Payer: Self-pay

## 2023-09-01 NOTE — Telephone Encounter (Signed)
 I left a message on the number(s) listed in the patients chart requesting the patient to call back regarding the upcomming appointment for 09/11/23. The provider is out of the office that day. The appointment has been canceled. Waiting for the patient to return the call.  NOTE: If the patient does not call back within a week to reschedule this appointment, the front staff will mail the patient a letter requesting to call the office back.

## 2023-09-03 DIAGNOSIS — H34811 Central retinal vein occlusion, right eye, with macular edema: Secondary | ICD-10-CM | POA: Diagnosis not present

## 2023-09-08 ENCOUNTER — Encounter: Payer: Self-pay | Admitting: Family Medicine

## 2023-09-09 ENCOUNTER — Ambulatory Visit: Payer: Medicare Other | Admitting: Physician Assistant

## 2023-09-10 ENCOUNTER — Encounter: Payer: Self-pay | Admitting: Physician Assistant

## 2023-09-10 ENCOUNTER — Ambulatory Visit (INDEPENDENT_AMBULATORY_CARE_PROVIDER_SITE_OTHER): Payer: 59 | Admitting: Physician Assistant

## 2023-09-10 VITALS — BP 144/90 | HR 69 | Temp 97.6°F | Resp 16 | Ht 71.0 in | Wt 191.6 lb

## 2023-09-10 DIAGNOSIS — Z23 Encounter for immunization: Secondary | ICD-10-CM | POA: Diagnosis not present

## 2023-09-10 DIAGNOSIS — G2581 Restless legs syndrome: Secondary | ICD-10-CM

## 2023-09-10 DIAGNOSIS — I10 Essential (primary) hypertension: Secondary | ICD-10-CM

## 2023-09-10 DIAGNOSIS — R3589 Other polyuria: Secondary | ICD-10-CM | POA: Diagnosis not present

## 2023-09-10 MED ORDER — PRAMIPEXOLE DIHYDROCHLORIDE 0.25 MG PO TABS: 0.2500 mg | ORAL_TABLET | Freq: Every day | ORAL | 1 refills | Status: DC

## 2023-09-10 MED ORDER — CHLORTHALIDONE 50 MG PO TABS: 50.0000 mg | ORAL_TABLET | Freq: Every day | ORAL | 1 refills | Status: DC

## 2023-09-10 NOTE — Assessment & Plan Note (Signed)
Occasional symptoms managed with pramipexole as needed. -Continue pramipexole as needed for restless leg symptoms.

## 2023-09-10 NOTE — Telephone Encounter (Signed)
Pt was seen on 09/10/23

## 2023-09-10 NOTE — Assessment & Plan Note (Signed)
Improvement noted with decreased evening fluid intake. -Continue current management strategy.

## 2023-09-10 NOTE — Progress Notes (Signed)
Subjective:  Patient ID: Susan Roberson, female    DOB: [birth year]  Age: 73 y.o. MRN: [id number]  Chief Complaint  Patient presents with   Hypertension   Medical Management of Chronic Issues    HPI  Discussed the use of AI scribe software for clinical note transcription with the patient, who gave verbal consent to proceed.  History of Present Illness   A 73 year old patient with a history of hypertension presents for a follow-up visit. The patient reports that her blood pressure readings have been consistently high, with readings ranging from 147/80 to 183/104 over the past week. She notes that these readings were taken before her morning medication. The patient is currently on Benicar, amlodipine, and chlorthalidone for her hypertension. She reports feeling well overall, with no headaches or other symptoms related to her hypertension.  The patient also has restless leg syndrome, which she manages with medication as needed, typically once or twice a month. She reports that her symptoms are well-controlled with this regimen.  In addition, the patient has regular injections into her eye due to a previous complication from her high BP. She has had five injections so far and reports that her vision is improving. She notes that she had a painful experience during her last injection due to inadequate numbing of her eye, but she is otherwise satisfied with the treatment.  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 She reports that she feels better and her blood pressure improves when she is busy.           09/10/2023   10:46 AM 05/12/2023    7:45 AM 04/21/2023    7:38 AM 03/31/2023   10:38 AM 01/16/2023    7:50 AM  Depression screen PHQ 2/9  Decreased Interest 0 0 0 0 0  Down, Depressed, Hopeless 0 0 0 0 0  PHQ - 2 Score 0 0 0 0 0  Altered sleeping 1 1 1   0  Tired, decreased energy 0 0 0  0  Change in appetite 1 0 0  0  Feeling bad or  failure about yourself  0 0 0  0  Trouble concentrating 0 0 0  0  Moving slowly or fidgety/restless 0 0 0  0  Suicidal thoughts 0 0 0  0  PHQ-9 Score 2 1 1   0  Difficult doing work/chores Not difficult at all Not difficult at all Not difficult at all  Not difficult at all        09/10/2023   10:46 AM  Fall Risk   Falls in the past year? 0  Number falls in past yr: 0  Injury with Fall? 0  Risk for fall due to : No Fall Risks  Follow up Falls evaluation completed    Patient Care Team: Langley Gauss, [place] as PCP - General (Physician Assistant) Susan Lea, MD as Consulting Physician (Cardiology)   Review of Systems  Constitutional:  Negative for chills, fatigue and fever.  HENT:  Negative for congestion, ear pain and sore throat.   Respiratory:  Negative for cough and shortness of breath.   Cardiovascular:  Negative for chest pain and palpitations.  Gastrointestinal:  Negative for abdominal pain, constipation, diarrhea, nausea and vomiting.  Genitourinary:  Negative for difficulty urinating and dysuria.  Musculoskeletal:  Negative for arthralgias, back pain and myalgias.  Skin:  Negative for rash.  Neurological:  Negative for dizziness and headaches.  Psychiatric/Behavioral:  Negative for  dysphoric mood.     Current Outpatient Medications on File Prior to Visit  Medication Sig Dispense Refill   celecoxib (CELEBREX) 100 MG capsule TAKE ONE CAPSULE BY MOUTH DAILY 90 capsule 0   cyanocobalamin (VITAMIN B12) 1000 MCG tablet TAKE ONE TABLET BY MOUTH DAILY 90 tablet 0   olmesartan (BENICAR) 40 MG tablet Take 1 tablet (40 mg total) by mouth daily. 90 tablet 3   Vitamin D, Ergocalciferol, (DRISDOL) 1.25 MG (50000 UNIT) CAPS capsule Take 1 capsule by mouth every 7 (seven) days. 5 capsule 2   No current facility-administered medications on file prior to visit.   Past Medical History:  Diagnosis Date   Arthritis    Cancer (HCC)    pre cancerous skin cancer with multiple lesions  removed   Complication of anesthesia    broken teeth   Essential (primary) hypertension    Generalized anxiety disorder    Hyperkalemia    Hypertension    Past Surgical History:  Procedure Laterality Date   SKIN CANCER EXCISION Bilateral    legs   TOTAL HIP ARTHROPLASTY Right    5 yrs ago   TOTAL KNEE ARTHROPLASTY Right 03/10/2016   Procedure: RIGHT TOTAL KNEE ARTHROPLASTY;  Surgeon: Dannielle Huh, MD;  Location: [facility] OR;  Service: Orthopedics;  Laterality: Right;    Family History  Problem Relation Age of Onset   Stroke Father    Cerebrovascular Accident Other    Diabetes Mellitus II Other    Hyperlipidemia Other    Hypertension Other    Social History   Socioeconomic History   Marital status: Married    Spouse name: Not on file   Number of children: Not on file   Years of education: Not on file   Highest education level: Not on file  Occupational History   Not on file  Tobacco Use   Smoking status: Never   Smokeless tobacco: Never  Vaping Use   Vaping status: Never Used  Substance and Sexual Activity   Alcohol use: Yes    Comment: Drinks on a social basis.   Drug use: No   Sexual activity: Not on file  Other Topics Concern   Not on file  Social History Narrative   Not on file   Social Drivers of Health   Financial Resource Strain: Low Risk  (03/31/2023)   Overall Financial Resource Strain (CARDIA)    Difficulty of Paying Living Expenses: Not hard at all  Food Insecurity: No Food Insecurity (03/31/2023)   Hunger Vital Sign    Worried About Running Out of Food in the Last Year: Never true    Ran Out of Food in the Last Year: Never true  Transportation Needs: No Transportation Needs (03/31/2023)   PRAPARE - Administrator, Civil Service (Medical): No    Lack of Transportation (Non-Medical): No  Physical Activity: Sufficiently Active (03/31/2023)   Exercise Vital Sign    Days of Exercise per Week: 5 days    Minutes of Exercise per Session: 60 min   Stress: No Stress Concern Present (03/31/2023)   [employer] of Occupational Health - Occupational Stress Questionnaire    Feeling of Stress : Not at all  Social Connections: Socially Integrated (03/31/2023)   Social Connection and Isolation Panel [NHANES]    Frequency of Communication with Friends and Family: More than three times a week    Frequency of Social Gatherings with Friends and Family: More than three times a week    Attends  Religious Services: More than 4 times per year    Active Member of Clubs or Organizations: Yes    Attends Banker Meetings: More than 4 times per year    Marital Status: Married    Objective:  BP (!) 144/90 (BP Location: Right Arm, Patient Position: Sitting, Cuff Size: Large)   Pulse 69   Temp 97.6 F (36.4 C) (Temporal)   Resp 16   Ht 5\' 11"  (1.803 m)   Wt 191 lb 9.6 oz (86.9 kg)   SpO2 99%   BMI 26.72 kg/m      09/10/2023   10:38 AM 08/13/2023    7:31 AM 08/07/2023    8:54 AM  BP/Weight  Systolic BP 144 160 142  Diastolic BP 90 84 74  Wt. (Lbs) 191.6 190.6 194  BMI 26.72 kg/m2 27.35 kg/m2 27.84 kg/m2    Physical Exam Vitals reviewed.  Constitutional:      Appearance: Normal appearance.  Neck:     Vascular: No carotid bruit.  Cardiovascular:     Rate and Rhythm: Normal rate and regular rhythm.     Heart sounds: Normal heart sounds.  Pulmonary:     Effort: Pulmonary effort is normal.     Breath sounds: Normal breath sounds.  Abdominal:     General: Bowel sounds are normal.     Palpations: Abdomen is soft.     Tenderness: There is no abdominal tenderness.  Neurological:     Mental Status: She is alert and oriented to person, place, and time.  Psychiatric:        Mood and Affect: Mood normal.        Behavior: Behavior normal.     Diabetic Foot Exam - Simple   No data filed      Lab Results  Component Value Date   WBC 7.6 04/21/2023   HGB 13.3 04/21/2023   HCT 40.8 04/21/2023   PLT 354 04/21/2023    GLUCOSE 104 (H) 08/17/2023   CHOL 209 (H) 04/21/2023   TRIG 175 (H) 04/21/2023   HDL 58 04/21/2023   LDLCALC 120 (H) 04/21/2023   ALT 17 04/21/2023   AST 21 04/21/2023   NA 136 08/17/2023   K 4.7 08/17/2023   CL 96 08/17/2023   CREATININE 0.93 08/17/2023   BUN 20 08/17/2023   CO2 19 (L) 08/17/2023   TSH 2.530 12/18/2022   INR 0.91 02/28/2016   HGBA1C 6.2 (H) 04/21/2023      Assessment & Plan:    Uncontrolled hypertension Assessment & Plan: Blood pressure readings remain elevated despite current regimen of Benicar, amlodipine, and chlorthalidone. No reported side effects from current medications. Patient is active and feels well overall. -Increase chlorthalidone from 25mg  to 50mg  daily. -Monitor blood pressure regularly, particularly after medication administration.  Orders: -     Chlorthalidone; Take 1 tablet (50 mg total) by mouth daily.  Dispense: 30 tablet; Refill: 1  Restless legs Assessment & Plan: Occasional symptoms managed with pramipexole as needed. -Continue pramipexole as needed for restless leg symptoms.  Orders: -     Pramipexole Dihydrochloride; Take 1 tablet (0.25 mg total) by mouth at bedtime. As needed  Dispense: 30 tablet; Refill: 1  Polyuria Assessment & Plan: Improvement noted with decreased evening fluid intake. -Continue current management strategy.   Immunization due -     Pfizer Comirnaty Covid-19 Vaccine 16yrs & older     Meds ordered this encounter  Medications   pramipexole (MIRAPEX) 0.25 MG tablet  Sig: Take 1 tablet (0.25 mg total) by mouth at bedtime. As needed    Dispense:  30 tablet    Refill:  1   chlorthalidone (HYGROTON) 50 MG tablet    Sig: Take 1 tablet (50 mg total) by mouth daily.    Dispense:  30 tablet    Refill:  1    Orders Placed This Biochemist, clinical Covid -19 Vaccine 63yrs and older    General Health Maintenance -Continue current lifestyle modifications for hypertension  management, including reduced salt intake and weight loss efforts. -Follow-up as needed for blood pressure management and any new concerns.      Follow-up: Return if symptoms worsen or fail to improve.   I,Angela Taylor,acting as a Neurosurgeon for [employer], PA.,have documented all relevant documentation on the behalf of Langley Gauss, PA,as directed by  Langley Gauss, PA while in the presence of Langley Gauss, [place].   An After Visit Summary was printed and given to the patient.  Langley Gauss, [place] [facility] [phone]

## 2023-09-10 NOTE — Assessment & Plan Note (Signed)
Blood pressure readings remain elevated despite current regimen of Benicar, amlodipine, and chlorthalidone. No reported side effects from current medications. Patient is active and feels well overall. -Increase chlorthalidone from 25mg  to 50mg  daily. -Monitor blood pressure regularly, particularly after medication administration.

## 2023-09-11 ENCOUNTER — Ambulatory Visit: Payer: Medicare Other | Admitting: Physician Assistant

## 2023-10-01 DIAGNOSIS — H34811 Central retinal vein occlusion, right eye, with macular edema: Secondary | ICD-10-CM | POA: Diagnosis not present

## 2023-10-29 DIAGNOSIS — H34811 Central retinal vein occlusion, right eye, with macular edema: Secondary | ICD-10-CM | POA: Diagnosis not present

## 2023-11-13 DIAGNOSIS — E559 Vitamin D deficiency, unspecified: Secondary | ICD-10-CM

## 2023-11-13 HISTORY — DX: Vitamin D deficiency, unspecified: E55.9

## 2023-11-13 NOTE — Assessment & Plan Note (Signed)
 Labs drawn today. Continue Vitamin D 50,000 units once weekly.

## 2023-11-13 NOTE — Assessment & Plan Note (Signed)
 Patient reports intolerance to statin therapy due to muscle aches and cramps. Pending cholesterol results from recent blood work. -Consider Repatha injection for cholesterol management depending on cholesterol results.

## 2023-11-13 NOTE — Assessment & Plan Note (Signed)
 Labs drawn today. Will adjust treatment depending on results Continue monitoring diet and exercise

## 2023-11-13 NOTE — Progress Notes (Deleted)
 Subjective:  Patient ID: Susan Roberson, female    DOB: 01-16-1951  Age: 73 y.o. MRN: [id number]  Chief Complaint  Patient presents with   Medical Management of Chronic Issues    Discussed the use of AI scribe software for clinical note transcription with the patient, who gave verbal consent to proceed.         09/10/2023   10:46 AM 05/12/2023    7:45 AM 04/21/2023    7:38 AM 03/31/2023   10:38 AM 01/16/2023    7:50 AM  Depression screen PHQ 2/9  Decreased Interest 0 0 0 0 0  Down, Depressed, Hopeless 0 0 0 0 0  PHQ - 2 Score 0 0 0 0 0  Altered sleeping 1 1 1   0  Tired, decreased energy 0 0 0  0  Change in appetite 1 0 0  0  Feeling bad or failure about yourself  0 0 0  0  Trouble concentrating 0 0 0  0  Moving slowly or fidgety/restless 0 0 0  0  Suicidal thoughts 0 0 0  0  PHQ-9 Score 2 1 1   0  Difficult doing work/chores Not difficult at all Not difficult at all Not difficult at all  Not difficult at all        09/10/2023   10:46 AM  Fall Risk   Falls in the past year? 0  Number falls in past yr: 0  Injury with Fall? 0  Risk for fall due to : No Fall Risks  Follow up Falls evaluation completed    Patient Care Team: Langley Gauss, [place] as PCP - General (Physician Assistant) Susan Lea, MD as Consulting Physician (Cardiology)   Review of Systems  Constitutional:  Negative for appetite change, fatigue and fever.  HENT:  Negative for congestion, ear pain, sinus pressure and sore throat.   Respiratory:  Negative for cough, chest tightness, shortness of breath and wheezing.   Cardiovascular:  Negative for chest pain and palpitations.  Gastrointestinal:  Negative for abdominal pain, constipation, diarrhea, nausea and vomiting.  Genitourinary:  Negative for dysuria and hematuria.  Musculoskeletal:  Negative for arthralgias, back pain, joint swelling and myalgias.  Skin:  Negative for rash.  Neurological:  Negative for dizziness, weakness and headaches.   Psychiatric/Behavioral:  Negative for dysphoric mood. The patient is not nervous/anxious.     Current Outpatient Medications on File Prior to Visit  Medication Sig Dispense Refill   celecoxib (CELEBREX) 100 MG capsule TAKE ONE CAPSULE BY MOUTH DAILY 90 capsule 0   chlorthalidone (HYGROTON) 50 MG tablet Take 1 tablet (50 mg total) by mouth daily. 30 tablet 1   cyanocobalamin (VITAMIN B12) 1000 MCG tablet TAKE ONE TABLET BY MOUTH DAILY 90 tablet 0   olmesartan (BENICAR) 40 MG tablet Take 1 tablet (40 mg total) by mouth daily. 90 tablet 3   pramipexole (MIRAPEX) 0.25 MG tablet Take 1 tablet (0.25 mg total) by mouth at bedtime. As needed 30 tablet 1   Vitamin D, Ergocalciferol, (DRISDOL) 1.25 MG (50000 UNIT) CAPS capsule Take 1 capsule by mouth every 7 (seven) days. 5 capsule 2   No current facility-administered medications on file prior to visit.   Past Medical History:  Diagnosis Date   Arthritis    Cancer (HCC)    pre cancerous skin cancer with multiple lesions removed   Complication of anesthesia    broken teeth   Essential (primary) hypertension    Generalized anxiety disorder  Hyperkalemia    Hypertension    Past Surgical History:  Procedure Laterality Date   SKIN CANCER EXCISION Bilateral    legs   TOTAL HIP ARTHROPLASTY Right    5 yrs ago   TOTAL KNEE ARTHROPLASTY Right 03/10/2016   Procedure: RIGHT TOTAL KNEE ARTHROPLASTY;  Surgeon: Dannielle Huh, MD;  Location: [facility] OR;  Service: Orthopedics;  Laterality: Right;    Family History  Problem Relation Age of Onset   Stroke Father    Cerebrovascular Accident Other    Diabetes Mellitus II Other    Hyperlipidemia Other    Hypertension Other    Social History   Socioeconomic History   Marital status: Married    Spouse name: Not on file   Number of children: Not on file   Years of education: Not on file   Highest education level: Not on file  Occupational History   Not on file  Tobacco Use   Smoking status: Never    Smokeless tobacco: Never  Vaping Use   Vaping status: Never Used  Substance and Sexual Activity   Alcohol use: Yes    Comment: Drinks on a social basis.   Drug use: No   Sexual activity: Not on file  Other Topics Concern   Not on file  Social History Narrative   Not on file   Social Drivers of Health   Financial Resource Strain: Low Risk  (03/31/2023)   Overall Financial Resource Strain (CARDIA)    Difficulty of Paying Living Expenses: Not hard at all  Food Insecurity: No Food Insecurity (03/31/2023)   Hunger Vital Sign    Worried About Running Out of Food in the Last Year: Never true    Ran Out of Food in the Last Year: Never true  Transportation Needs: No Transportation Needs (03/31/2023)   PRAPARE - Administrator, Civil Service (Medical): No    Lack of Transportation (Non-Medical): No  Physical Activity: Sufficiently Active (03/31/2023)   Exercise Vital Sign    Days of Exercise per Week: 5 days    Minutes of Exercise per Session: 60 min  Stress: No Stress Concern Present (03/31/2023)   [employer] of Occupational Health - Occupational Stress Questionnaire    Feeling of Stress : Not at all  Social Connections: Socially Integrated (03/31/2023)   Social Connection and Isolation Panel [NHANES]    Frequency of Communication with Friends and Family: More than three times a week    Frequency of Social Gatherings with Friends and Family: More than three times a week    Attends Religious Services: More than 4 times per year    Active Member of [employer] or Organizations: Yes    Attends Engineer, structural: More than 4 times per year    Marital Status: Married    Objective:  There were no vitals taken for this visit.     09/10/2023   10:38 AM 08/13/2023    7:31 AM 08/07/2023    8:54 AM  BP/Weight  Systolic BP 144 160 142  Diastolic BP 90 84 74  Wt. (Lbs) 191.6 190.6 194  BMI 26.72 kg/m2 27.35 kg/m2 27.84 kg/m2    Physical Exam  Diabetic Foot Exam  - Simple   No data filed      Lab Results  Component Value Date   WBC 7.6 04/21/2023   HGB 13.3 04/21/2023   HCT 40.8 04/21/2023   PLT 354 04/21/2023   GLUCOSE 104 (H) 08/17/2023   CHOL 209 (  H) 04/21/2023   TRIG 175 (H) 04/21/2023   HDL 58 04/21/2023   LDLCALC 120 (H) 04/21/2023   ALT 17 04/21/2023   AST 21 04/21/2023   NA 136 08/17/2023   K 4.7 08/17/2023   CL 96 08/17/2023   CREATININE 0.93 08/17/2023   BUN 20 08/17/2023   CO2 19 (L) 08/17/2023   TSH 2.530 12/18/2022   INR 0.91 02/28/2016   HGBA1C 6.2 (H) 04/21/2023      Assessment & Plan:  Assessment and Plan       Uncontrolled hypertension  Mixed hyperlipidemia Assessment & Plan: Patient reports intolerance to statin therapy due to muscle aches and cramps. Pending cholesterol results from recent blood work. -Consider Repatha injection for cholesterol management depending on cholesterol results.   Prediabetes Assessment & Plan: Labs drawn today. Will adjust treatment depending on results Continue monitoring diet and exercise   Sleep disturbances  Restless legs  Vitamin D deficiency Assessment & Plan: Labs drawn today. Continue Vitamin D 50,000 units once weekly.      No orders of the defined types were placed in this encounter.   No orders of the defined types were placed in this encounter.    Follow-up: No follow-ups on file.   I,Calder Oblinger M Reino Lybbert,acting as a Neurosurgeon for [employer], PA.,have documented all relevant documentation on the behalf of Langley Gauss, PA,as directed by  Langley Gauss, PA while in the presence of Langley Gauss, [place].   An After Visit Summary was printed and given to the patient.  Langley Gauss, [place] [facility] [phone]

## 2023-11-14 ENCOUNTER — Other Ambulatory Visit: Payer: Self-pay | Admitting: Physician Assistant

## 2023-11-14 DIAGNOSIS — G2581 Restless legs syndrome: Secondary | ICD-10-CM

## 2023-11-16 ENCOUNTER — Encounter: Payer: Medicare Other | Admitting: Physician Assistant

## 2023-11-16 DIAGNOSIS — I10 Essential (primary) hypertension: Secondary | ICD-10-CM

## 2023-11-16 DIAGNOSIS — R7303 Prediabetes: Secondary | ICD-10-CM

## 2023-11-16 DIAGNOSIS — E559 Vitamin D deficiency, unspecified: Secondary | ICD-10-CM

## 2023-11-16 DIAGNOSIS — G2581 Restless legs syndrome: Secondary | ICD-10-CM

## 2023-11-16 DIAGNOSIS — G479 Sleep disorder, unspecified: Secondary | ICD-10-CM

## 2023-11-16 DIAGNOSIS — E782 Mixed hyperlipidemia: Secondary | ICD-10-CM

## 2023-11-18 NOTE — Progress Notes (Signed)
 This encounter was created in error - please disregard.

## 2023-11-25 NOTE — Progress Notes (Unsigned)
 Subjective:  Patient ID: Susan Roberson, female    DOB: 1951/01/16  Age: 73 y.o. MRN: [id number]  No chief complaint on file.   Discussed the use of AI scribe software for clinical note transcription with the patient, who gave verbal consent to proceed.         09/10/2023   10:46 AM 05/12/2023    7:45 AM 04/21/2023    7:38 AM 03/31/2023   10:38 AM 01/16/2023    7:50 AM  Depression screen PHQ 2/9  Decreased Interest 0 0 0 0 0  Down, Depressed, Hopeless 0 0 0 0 0  PHQ - 2 Score 0 0 0 0 0  Altered sleeping 1 1 1   0  Tired, decreased energy 0 0 0  0  Change in appetite 1 0 0  0  Feeling bad or failure about yourself  0 0 0  0  Trouble concentrating 0 0 0  0  Moving slowly or fidgety/restless 0 0 0  0  Suicidal thoughts 0 0 0  0  PHQ-9 Score 2 1 1   0  Difficult doing work/chores Not difficult at all Not difficult at all Not difficult at all  Not difficult at all        09/10/2023   10:46 AM  Fall Risk   Falls in the past year? 0  Number falls in past yr: 0  Injury with Fall? 0  Risk for fall due to : No Fall Risks  Follow up Falls evaluation completed    Patient Care Team: Langley Gauss, [place] as PCP - General (Physician Assistant) Georgeanna Lea, MD as Consulting Physician (Cardiology)   Review of Systems  Constitutional:  Negative for appetite change, fatigue and fever.  HENT:  Negative for congestion, ear pain, sinus pressure and sore throat.   Respiratory:  Negative for cough, chest tightness, shortness of breath and wheezing.   Cardiovascular:  Negative for chest pain and palpitations.  Gastrointestinal:  Negative for abdominal pain, constipation, diarrhea, nausea and vomiting.  Genitourinary:  Negative for dysuria and hematuria.  Musculoskeletal:  Negative for arthralgias, back pain, joint swelling and myalgias.  Skin:  Negative for rash.  Neurological:  Negative for dizziness, weakness and headaches.  Psychiatric/Behavioral:  Negative for dysphoric mood. The  patient is not nervous/anxious.     Current Outpatient Medications on File Prior to Visit  Medication Sig Dispense Refill   celecoxib (CELEBREX) 100 MG capsule TAKE ONE CAPSULE BY MOUTH DAILY 90 capsule 0   chlorthalidone (HYGROTON) 50 MG tablet Take 1 tablet (50 mg total) by mouth daily. 30 tablet 1   cyanocobalamin (VITAMIN B12) 1000 MCG tablet TAKE ONE TABLET BY MOUTH DAILY 90 tablet 0   olmesartan (BENICAR) 40 MG tablet Take 1 tablet (40 mg total) by mouth daily. 90 tablet 3   pramipexole (MIRAPEX) 0.25 MG tablet Take 1 tablet (0.25 mg total) by mouth at bedtime. As needed 30 tablet 1   Vitamin D, Ergocalciferol, (DRISDOL) 1.25 MG (50000 UNIT) CAPS capsule Take 1 capsule by mouth every 7 (seven) days. 5 capsule 2   No current facility-administered medications on file prior to visit.   Past Medical History:  Diagnosis Date   Arthritis    Cancer (HCC)    pre cancerous skin cancer with multiple lesions removed   Complication of anesthesia    broken teeth   Essential (primary) hypertension    Generalized anxiety disorder    Hyperkalemia    Hypertension    Past  Surgical History:  Procedure Laterality Date   SKIN CANCER EXCISION Bilateral    legs   TOTAL HIP ARTHROPLASTY Right    5 yrs ago   TOTAL KNEE ARTHROPLASTY Right 03/10/2016   Procedure: RIGHT TOTAL KNEE ARTHROPLASTY;  Surgeon: Dannielle Huh, MD;  Location: [facility] OR;  Service: Orthopedics;  Laterality: Right;    Family History  Problem Relation Age of Onset   Stroke Father    Cerebrovascular Accident Other    Diabetes Mellitus II Other    Hyperlipidemia Other    Hypertension Other    Social History   Socioeconomic History   Marital status: Married    Spouse name: Not on file   Number of children: Not on file   Years of education: Not on file   Highest education level: Not on file  Occupational History   Not on file  Tobacco Use   Smoking status: Never   Smokeless tobacco: Never  Vaping Use   Vaping status:  Never Used  Substance and Sexual Activity   Alcohol use: Yes    Comment: Drinks on a social basis.   Drug use: No   Sexual activity: Not on file  Other Topics Concern   Not on file  Social History Narrative   Not on file   Social Drivers of Health   Financial Resource Strain: Low Risk  (03/31/2023)   Overall Financial Resource Strain (CARDIA)    Difficulty of Paying Living Expenses: Not hard at all  Food Insecurity: No Food Insecurity (03/31/2023)   Hunger Vital Sign    Worried About Running Out of Food in the Last Year: Never true    Ran Out of Food in the Last Year: Never true  Transportation Needs: No Transportation Needs (03/31/2023)   PRAPARE - Administrator, Civil Service (Medical): No    Lack of Transportation (Non-Medical): No  Physical Activity: Sufficiently Active (03/31/2023)   Exercise Vital Sign    Days of Exercise per Week: 5 days    Minutes of Exercise per Session: 60 min  Stress: No Stress Concern Present (03/31/2023)   [employer] of Occupational Health - Occupational Stress Questionnaire    Feeling of Stress : Not at all  Social Connections: Socially Integrated (03/31/2023)   Social Connection and Isolation Panel [NHANES]    Frequency of Communication with Friends and Family: More than three times a week    Frequency of Social Gatherings with Friends and Family: More than three times a week    Attends Religious Services: More than 4 times per year    Active Member of [employer] or Organizations: Yes    Attends Engineer, structural: More than 4 times per year    Marital Status: Married    Objective:  There were no vitals taken for this visit.     09/10/2023   10:38 AM 08/13/2023    7:31 AM 08/07/2023    8:54 AM  BP/Weight  Systolic BP 144 160 142  Diastolic BP 90 84 74  Wt. (Lbs) 191.6 190.6 194  BMI 26.72 kg/m2 27.35 kg/m2 27.84 kg/m2    Physical Exam  Diabetic Foot Exam - Simple   No data filed      Lab Results   Component Value Date   WBC 7.6 04/21/2023   HGB 13.3 04/21/2023   HCT 40.8 04/21/2023   PLT 354 04/21/2023   GLUCOSE 104 (H) 08/17/2023   CHOL 209 (H) 04/21/2023   TRIG 175 (H) 04/21/2023  HDL 58 04/21/2023   LDLCALC 120 (H) 04/21/2023   ALT 17 04/21/2023   AST 21 04/21/2023   NA 136 08/17/2023   K 4.7 08/17/2023   CL 96 08/17/2023   CREATININE 0.93 08/17/2023   BUN 20 08/17/2023   CO2 19 (L) 08/17/2023   TSH 2.530 12/18/2022   INR 0.91 02/28/2016   HGBA1C 6.2 (H) 04/21/2023      Assessment & Plan:  Assessment and Plan       There are no diagnoses linked to this encounter.   No orders of the defined types were placed in this encounter.   No orders of the defined types were placed in this encounter.    Follow-up: No follow-ups on file.   I,Lauren M Auman,acting as a Neurosurgeon for [employer], PA.,have documented all relevant documentation on the behalf of Langley Gauss, PA,as directed by  Langley Gauss, PA while in the presence of Langley Gauss, [place].   An After Visit Summary was printed and given to the patient.  Langley Gauss, [place] [facility] [phone]

## 2023-11-26 ENCOUNTER — Encounter: Payer: Self-pay | Admitting: Physician Assistant

## 2023-11-26 ENCOUNTER — Ambulatory Visit (INDEPENDENT_AMBULATORY_CARE_PROVIDER_SITE_OTHER): Admitting: Physician Assistant

## 2023-11-26 VITALS — BP 122/80 | HR 75 | Temp 97.5°F | Ht 71.0 in | Wt 192.8 lb

## 2023-11-26 DIAGNOSIS — E782 Mixed hyperlipidemia: Secondary | ICD-10-CM

## 2023-11-26 DIAGNOSIS — R7303 Prediabetes: Secondary | ICD-10-CM

## 2023-11-26 DIAGNOSIS — I10 Essential (primary) hypertension: Secondary | ICD-10-CM

## 2023-11-26 DIAGNOSIS — G2581 Restless legs syndrome: Secondary | ICD-10-CM

## 2023-11-26 DIAGNOSIS — G459 Transient cerebral ischemic attack, unspecified: Secondary | ICD-10-CM

## 2023-11-26 LAB — LIPID PANEL
Chol/HDL Ratio: 4.8 ratio — ABNORMAL HIGH (ref 0.0–4.4)
Cholesterol, Total: 270 mg/dL — ABNORMAL HIGH (ref 100–199)
HDL: 56 mg/dL (ref >39)
LDL Chol Calc (NIH): 179 mg/dL — ABNORMAL HIGH (ref 0–99)
Triglycerides: 188 mg/dL — ABNORMAL HIGH (ref 0–149)
VLDL Cholesterol Cal: 35 mg/dL (ref 5–40)

## 2023-11-26 LAB — CBC WITH DIFFERENTIAL/PLATELET
Basophils Absolute: 0.1 10*3/uL (ref 0.0–0.2)
Basos: 1 %
EOS (ABSOLUTE): 0.3 10*3/uL (ref 0.0–0.4)
Eos: 5 %
Hematocrit: 38.4 % (ref 34.0–46.6)
Hemoglobin: 12.9 g/dL (ref 11.1–15.9)
Immature Grans (Abs): 0 10*3/uL (ref 0.0–0.1)
Immature Granulocytes: 0 %
Lymphocytes Absolute: 1.6 10*3/uL (ref 0.7–3.1)
Lymphs: 24 %
MCH: 29.7 pg (ref 26.6–33.0)
MCHC: 33.6 g/dL (ref 31.5–35.7)
MCV: 88 fL (ref 79–97)
Monocytes Absolute: 0.6 10*3/uL (ref 0.1–0.9)
Monocytes: 9 %
Neutrophils Absolute: 4 10*3/uL (ref 1.4–7.0)
Neutrophils: 61 %
Platelets: 387 10*3/uL (ref 150–450)
RBC: 4.35 x10E6/uL (ref 3.77–5.28)
RDW: 12.3 % (ref 11.7–15.4)
WBC: 6.7 10*3/uL (ref 3.4–10.8)

## 2023-11-26 LAB — COMPREHENSIVE METABOLIC PANEL WITH GFR
ALT: 23 IU/L (ref 0–32)
AST: 27 IU/L (ref 0–40)
Albumin: 4.5 g/dL (ref 3.8–4.8)
Alkaline Phosphatase: 102 IU/L (ref 44–121)
BUN/Creatinine Ratio: 23 (ref 12–28)
BUN: 25 mg/dL (ref 8–27)
Bilirubin Total: 0.3 mg/dL (ref 0.0–1.2)
CO2: 22 mmol/L (ref 20–29)
Calcium: 10.1 mg/dL (ref 8.7–10.3)
Chloride: 102 mmol/L (ref 96–106)
Creatinine, Ser: 1.11 mg/dL — ABNORMAL HIGH (ref 0.57–1.00)
Globulin, Total: 2.9 g/dL (ref 1.5–4.5)
Glucose: 95 mg/dL (ref 70–99)
Potassium: 5.3 mmol/L — ABNORMAL HIGH (ref 3.5–5.2)
Sodium: 139 mmol/L (ref 134–144)
Total Protein: 7.4 g/dL (ref 6.0–8.5)
eGFR: 52 mL/min/{1.73_m2} — ABNORMAL LOW (ref >59)

## 2023-11-26 LAB — HEMOGLOBIN A1C
Est. average glucose Bld gHb Est-mCnc: 126 mg/dL
Hgb A1c MFr Bld: 6 % — ABNORMAL HIGH (ref 4.8–5.6)

## 2023-11-26 MED ORDER — PRAMIPEXOLE DIHYDROCHLORIDE 0.25 MG PO TABS: 0.2500 mg | ORAL_TABLET | Freq: Every day | ORAL | 1 refills | Status: DC

## 2023-11-26 NOTE — Assessment & Plan Note (Signed)
 Occasional symptoms managed with pramipexole as needed. -Continue pramipexole as needed for restless leg symptoms.

## 2023-11-26 NOTE — Assessment & Plan Note (Signed)
 Well-controlled with current medication. Blood pressure consistently 130-140 mmHg. - Continue current antihypertensive medication regimen. - Monitor blood pressure regularly.

## 2023-11-26 NOTE — Assessment & Plan Note (Signed)
 Patient reports intolerance to statin therapy due to muscle aches and cramps. Pending cholesterol results from recent blood work. -Consider Repatha injection for cholesterol management depending on cholesterol results.

## 2023-11-26 NOTE — Assessment & Plan Note (Signed)
 Labs drawn today. Will adjust treatment depending on results Continue monitoring diet and exercise

## 2023-11-26 NOTE — Patient Instructions (Signed)
 VISIT SUMMARY:  During your routine check-up, we discussed your overall health, including your blood pressure, eye condition, and swelling in your feet and legs. You reported feeling great overall and are satisfied with your current health status. We also talked about your husband's recovery from foot surgery.  YOUR PLAN:  -MACULAR DEGENERATION: Macular degeneration is an eye condition that affects the central part of the retina, leading to vision loss. You are receiving monthly steroid injections, which have shown some improvement in your vision. However, you experienced pain and pressure during recent injections, possibly due to increased eye pressure. Please continue with the monthly injections for at least a year and report any significant pain or pressure to your ophthalmologist.  -HYPERTENSION: Hypertension, or high blood pressure, is well-controlled with your current medication. Your blood pressure readings are consistently between 130-140 mmHg. Please continue taking your antihypertensive medication as prescribed and monitor your blood pressure regularly.  -PERIPHERAL EDEMA: Peripheral edema is swelling in the feet and legs, likely due to prolonged standing. You should wear compression socks on days when you will be standing for long periods and try to elevate your feet whenever possible.  -GENERAL HEALTH MAINTENANCE: Your cholesterol and A1c levels have improved, and you have no urinary symptoms. We will order blood work to check your cholesterol and A1c levels. Please continue to stay hydrated.  INSTRUCTIONS:  Please schedule a follow-up appointment with your cardiologist to monitor your heart health and blood pressure control. Plan for a follow-up visit in six months for blood work.

## 2023-11-26 NOTE — Assessment & Plan Note (Signed)
 Receiving monthly steroid injections. Initial improvement noted, recent injections caused pain and pressure, possibly due to increased intraocular pressure. Vision slightly improved after last injection. - Continue monthly steroid injections for at least a year. - Report significant pain or pressure to ophthalmologist.

## 2023-11-30 ENCOUNTER — Encounter: Payer: Self-pay | Admitting: Physician Assistant

## 2023-12-01 ENCOUNTER — Other Ambulatory Visit: Payer: Self-pay

## 2023-12-01 DIAGNOSIS — I700 Atherosclerosis of aorta: Secondary | ICD-10-CM

## 2023-12-01 DIAGNOSIS — E782 Mixed hyperlipidemia: Secondary | ICD-10-CM

## 2023-12-01 HISTORY — DX: Atherosclerosis of aorta: I70.0

## 2023-12-01 MED ORDER — REPATHA SURECLICK 140 MG/ML ~~LOC~~ SOAJ: 140.0000 mg | SUBCUTANEOUS | 2 refills | Status: DC

## 2023-12-10 DIAGNOSIS — H34811 Central retinal vein occlusion, right eye, with macular edema: Secondary | ICD-10-CM | POA: Diagnosis not present

## 2023-12-21 ENCOUNTER — Other Ambulatory Visit: Payer: Self-pay | Admitting: Physician Assistant

## 2023-12-21 DIAGNOSIS — M1991 Primary osteoarthritis, unspecified site: Secondary | ICD-10-CM

## 2024-01-04 ENCOUNTER — Other Ambulatory Visit: Payer: Self-pay

## 2024-01-22 DIAGNOSIS — H34811 Central retinal vein occlusion, right eye, with macular edema: Secondary | ICD-10-CM | POA: Diagnosis not present

## 2024-02-10 ENCOUNTER — Other Ambulatory Visit: Payer: Self-pay | Admitting: Physician Assistant

## 2024-02-10 DIAGNOSIS — M1991 Primary osteoarthritis, unspecified site: Secondary | ICD-10-CM

## 2024-02-15 ENCOUNTER — Other Ambulatory Visit: Payer: Self-pay | Admitting: Physician Assistant

## 2024-02-15 DIAGNOSIS — R252 Cramp and spasm: Secondary | ICD-10-CM

## 2024-02-15 DIAGNOSIS — Z6826 Body mass index (BMI) 26.0-26.9, adult: Secondary | ICD-10-CM

## 2024-02-15 DIAGNOSIS — E538 Deficiency of other specified B group vitamins: Secondary | ICD-10-CM

## 2024-02-15 MED ORDER — VITAMIN B-12 1000 MCG PO TABS: 1000.0000 ug | ORAL_TABLET | Freq: Every day | ORAL | 3 refills | Status: DC

## 2024-02-15 NOTE — Telephone Encounter (Signed)
 Copied from CRM [id number]. Topic: Clinical - Medication Refill >> Feb 15, 2024 11:51 AM Larissa S wrote: Medication:  cyanocobalamin  (VITAMIN B12) 1000 MCG tablet Vitamin D , Ergocalciferol , (DRISDOL ) 1.25 MG (50000 UNIT) CAPS capsule  Has the patient contacted their pharmacy? Yes (Agent: If no, request that the patient contact the pharmacy for the refill. If patient does not wish to contact the pharmacy document the reason why and proceed with request.) (Agent: If yes, when and what did the pharmacy advise?)  This is the patient's preferred pharmacy:  [facility] - [place], [place] - [address] [place] [address] Phone: [phone] Fax: [phone]  Is this the correct pharmacy for this prescription? Yes If no, delete pharmacy and type the correct one.   Has the prescription been filled recently? No  Is the patient out of the medication? Yes  Has the patient been seen for an appointment in the last year OR does the patient have an upcoming appointment? Yes  Can we respond through MyChart? No  Agent: Please be advised that Rx refills may take up to 3 business days. We ask that you follow-up with your pharmacy.

## 2024-03-03 ENCOUNTER — Other Ambulatory Visit: Payer: Self-pay | Admitting: Physician Assistant

## 2024-03-03 DIAGNOSIS — M1991 Primary osteoarthritis, unspecified site: Secondary | ICD-10-CM

## 2024-03-10 ENCOUNTER — Encounter: Payer: Self-pay | Admitting: Physician Assistant

## 2024-03-10 ENCOUNTER — Ambulatory Visit: Admitting: Physician Assistant

## 2024-03-10 ENCOUNTER — Ambulatory Visit (INDEPENDENT_AMBULATORY_CARE_PROVIDER_SITE_OTHER): Admitting: Physician Assistant

## 2024-03-10 VITALS — BP 146/80 | HR 72 | Temp 97.4°F | Resp 16 | Ht 71.0 in | Wt 197.0 lb

## 2024-03-10 DIAGNOSIS — R29898 Other symptoms and signs involving the musculoskeletal system: Secondary | ICD-10-CM | POA: Diagnosis not present

## 2024-03-10 DIAGNOSIS — F411 Generalized anxiety disorder: Secondary | ICD-10-CM

## 2024-03-10 DIAGNOSIS — E782 Mixed hyperlipidemia: Secondary | ICD-10-CM

## 2024-03-10 DIAGNOSIS — I700 Atherosclerosis of aorta: Secondary | ICD-10-CM | POA: Diagnosis not present

## 2024-03-10 MED ORDER — LORAZEPAM 0.5 MG PO TABS: 0.5000 mg | ORAL_TABLET | Freq: Two times a day (BID) | ORAL | 0 refills | Status: DC | PRN

## 2024-03-10 NOTE — Assessment & Plan Note (Signed)
 Worsening balance issues, particularly on curbs, with previous improvement from physical therapy. - Refer to physical therapy at [facility] in [place] on [address]

## 2024-03-10 NOTE — Assessment & Plan Note (Signed)
 Uncontrolled Unable to take statins due to intolerance with severe mayalgias Will send repatha  Will continue to monitor diet and exercise Will draw labs at next visit  Lab Results  Component Value Date   LDLCALC 179 (H) 11/26/2023

## 2024-03-10 NOTE — Assessment & Plan Note (Signed)
 Uncontrolled Unable to take statins due to intolerance with severe myalgias. Will send repatha 

## 2024-03-10 NOTE — Progress Notes (Signed)
 Subjective:  Patient ID: Susan Roberson, female    DOB: 1950/12/02  Age: 73 y.o. MRN: [id number]  Chief Complaint  Patient presents with   Anxiety    HPI:  Discussed the use of AI scribe software for clinical note transcription with the patient, who gave verbal consent to proceed.  History of Present Illness   Susan Roberson is a 73 year old female who presents with anxiety and sleep disturbances.  She experiences significant anxiety and sleep disturbances, primarily due to stress from her husband's and daughter's cancer diagnoses. She has difficulty sleeping, often going three days without sleep before sleeping for ten hours. Nighttime is particularly challenging, with her mind racing and nightmares occurring when she does fall asleep. She describes seeing her daughter in a casket in her nightmares. She has never been on medication for anxiety before but acknowledges the impact of her anxiety on her sleep and daily functioning. 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  She has noticed a decline in her balance, particularly when stepping up on curbs, and expresses a need to return to physical therapy. She previously attended therapy last year, which helped significantly, but her balance issues have worsened since then. She has not experienced any falls but feels at risk of falling. No recent falls or head injuries and no worsening headaches.  She has been monitoring her blood pressure, which has remained normal despite the stress, except for today when it was elevated due to rushing to the appointment.  She receives monthly injections for an unspecified condition, which have been effective, although the prescription has been changed a few times. She reports improvement in her vision and plans to continue with the injections.  She mentions a previous issue with statins causing severe aches, and her insurance did not approve a cholesterol  shot that was ordered for her. She is awaiting resolution on this matter.          03/10/2024   11:37 AM 11/26/2023    7:54 AM 09/10/2023   10:46 AM 05/12/2023    7:45 AM 04/21/2023    7:38 AM  Depression screen PHQ 2/9  Decreased Interest 0 0 0 0 0  Down, Depressed, Hopeless 3 0 0 0 0  PHQ - 2 Score 3 0 0 0 0  Altered sleeping 3  1 1 1   Tired, decreased energy 1  0 0 0  Change in appetite 0  1 0 0  Feeling bad or failure about yourself  1  0 0 0  Trouble concentrating 2  0 0 0  Moving slowly or fidgety/restless 0  0 0 0  Suicidal thoughts 0  0 0 0  PHQ-9 Score 10  2 1 1   Difficult doing work/chores Very difficult  Not difficult at all Not difficult at all Not difficult at all        03/10/2024   11:37 AM  Fall Risk   Falls in the past year? 0  Number falls in past yr: 0  Injury with Fall? 0  Risk for fall due to : No Fall Risks  Follow up Education provided    Patient Care Team: Milon Cleaves, [place] as PCP - General (Physician Assistant) Bernie Lamar PARAS, MD as Consulting Physician (Cardiology)   Review of Systems  Constitutional:  Negative for chills, fatigue and fever.  HENT:  Negative for congestion, ear pain and sore throat.   Respiratory:  Negative for  cough and shortness of breath.   Cardiovascular:  Negative for chest pain and palpitations.  Gastrointestinal:  Negative for abdominal pain, constipation, diarrhea, nausea and vomiting.  Endocrine: Negative for polydipsia, polyphagia and polyuria.  Genitourinary:  Negative for difficulty urinating and dysuria.  Musculoskeletal:  Negative for arthralgias, back pain and myalgias.  Skin:  Negative for rash.  Neurological:  Positive for dizziness. Negative for headaches.  Psychiatric/Behavioral:  Positive for sleep disturbance. Negative for dysphoric mood. The patient is nervous/anxious.     Current Outpatient Medications on File Prior to Visit  Medication Sig Dispense Refill   celecoxib  (CELEBREX ) 100 MG capsule TAKE  ONE CAPSULE BY MOUTH DAILY 90 capsule 3   chlorthalidone  (HYGROTON ) 50 MG tablet Take 1 tablet (50 mg total) by mouth daily. 30 tablet 1   cyanocobalamin  (VITAMIN B12) 1000 MCG tablet Take 1 tablet (1,000 mcg total) by mouth daily. 90 tablet 3   Evolocumab  (REPATHA  SURECLICK) 140 MG/ML SOAJ Inject 140 mg into the skin every 14 (fourteen) days. 2 mL 2   fluorouracil (EFUDEX) 5 % cream Apply topically.     hydrochlorothiazide  (HYDRODIURIL ) 25 MG tablet Take 25 mg by mouth daily.     olmesartan  (BENICAR ) 40 MG tablet Take 1 tablet (40 mg total) by mouth daily. 90 tablet 3   pramipexole  (MIRAPEX ) 0.25 MG tablet Take 1 tablet (0.25 mg total) by mouth at bedtime. As needed 30 tablet 1   Vitamin D , Ergocalciferol , (DRISDOL ) 1.25 MG (50000 UNIT) CAPS capsule Take 50,000 Units by mouth once a week.     No current facility-administered medications on file prior to visit.   Past Medical History:  Diagnosis Date   Arthritis    Cancer (HCC)    pre cancerous skin cancer with multiple lesions removed   Complication of anesthesia    broken teeth   Essential (primary) hypertension    Generalized anxiety disorder    Hyperkalemia    Hypertension    Past Surgical History:  Procedure Laterality Date   SKIN CANCER EXCISION Bilateral    legs   TOTAL HIP ARTHROPLASTY Right    5 yrs ago   TOTAL KNEE ARTHROPLASTY Right 03/10/2016   Procedure: RIGHT TOTAL KNEE ARTHROPLASTY;  Surgeon: Marcey Raman, MD;  Location: [facility] OR;  Service: Orthopedics;  Laterality: Right;    Family History  Problem Relation Age of Onset   Stroke Father    Cerebrovascular Accident Other    Diabetes Mellitus II Other    Hyperlipidemia Other    Hypertension Other    Social History   Socioeconomic History   Marital status: Married    Spouse name: Not on file   Number of children: Not on file   Years of education: Not on file   Highest education level: Not on file  Occupational History   Not on file  Tobacco Use   Smoking  status: Never   Smokeless tobacco: Never  Vaping Use   Vaping status: Never Used  Substance and Sexual Activity   Alcohol use: Yes    Comment: Drinks on a social basis.   Drug use: No   Sexual activity: Not on file  Other Topics Concern   Not on file  Social History Narrative   Not on file   Social Drivers of Health   Financial Resource Strain: Low Risk  (03/31/2023)   Overall Financial Resource Strain (CARDIA)    Difficulty of Paying Living Expenses: Not hard at all  Food Insecurity: No Food Insecurity (03/31/2023)  Hunger Vital Sign    Worried About Running Out of Food in the Last Year: Never true    Ran Out of Food in the Last Year: Never true  Transportation Needs: No Transportation Needs (03/31/2023)   PRAPARE - Administrator, Civil Service (Medical): No    Lack of Transportation (Non-Medical): No  Physical Activity: Sufficiently Active (03/31/2023)   Exercise Vital Sign    Days of Exercise per Week: 5 days    Minutes of Exercise per Session: 60 min  Stress: No Stress Concern Present (03/31/2023)   [employer] of Occupational Health - Occupational Stress Questionnaire    Feeling of Stress : Not at all  Social Connections: Socially Integrated (03/31/2023)   Social Connection and Isolation Panel    Frequency of Communication with Friends and Family: More than three times a week    Frequency of Social Gatherings with Friends and Family: More than three times a week    Attends Religious Services: More than 4 times per year    Active Member of [employer] or Organizations: Yes    Attends Engineer, structural: More than 4 times per year    Marital Status: Married    Objective:  BP (!) 158/70   Pulse 72   Temp (!) 97.4 F (36.3 C)   Resp 16   Ht 5' 11 (1.803 m)   Wt 197 lb (89.4 kg)   SpO2 98%   BMI 27.48 kg/m      03/10/2024   11:21 AM 11/26/2023    7:51 AM 09/10/2023   10:38 AM  BP/Weight  Systolic BP 158 122 144  Diastolic BP 70 80 90   Wt. (Lbs) 197 192.8 191.6  BMI 27.48 kg/m2 26.89 kg/m2 26.72 kg/m2    Physical Exam Vitals reviewed.  Constitutional:      Appearance: Normal appearance.  Cardiovascular:     Rate and Rhythm: Normal rate and regular rhythm.     Heart sounds: Normal heart sounds.  Pulmonary:     Effort: Pulmonary effort is normal.     Breath sounds: Normal breath sounds.  Abdominal:     General: Bowel sounds are normal.     Palpations: Abdomen is soft.     Tenderness: There is no abdominal tenderness.  Neurological:     Mental Status: She is alert and oriented to person, place, and time.  Psychiatric:        Mood and Affect: Mood normal.        Behavior: Behavior normal.         Lab Results  Component Value Date   WBC 6.7 11/26/2023   HGB 12.9 11/26/2023   HCT 38.4 11/26/2023   PLT 387 11/26/2023   GLUCOSE 95 11/26/2023   CHOL 270 (H) 11/26/2023   TRIG 188 (H) 11/26/2023   HDL 56 11/26/2023   LDLCALC 179 (H) 11/26/2023   ALT 23 11/26/2023   AST 27 11/26/2023   NA 139 11/26/2023   K 5.3 (H) 11/26/2023   CL 102 11/26/2023   CREATININE 1.11 (H) 11/26/2023   BUN 25 11/26/2023   CO2 22 11/26/2023   TSH 2.530 12/18/2022   INR 0.91 02/28/2016   HGBA1C 6.0 (H) 11/26/2023      Assessment & Plan:  Generalized anxiety disorder Assessment & Plan: Significant anxiety due to family stressors affecting sleep and daily function. - Prescribe lorazepam  .5mg  for use up to twice daily as needed. - Advise monitoring effectiveness and side effects.  Orders: -     LORazepam ; Take 1 tablet (0.5 mg total) by mouth 2 (two) times daily as needed for anxiety.  Dispense: 60 tablet; Refill: 0  Mixed hyperlipidemia Assessment & Plan: Uncontrolled Unable to take statins due to intolerance with severe mayalgias Will send repatha  Will continue to monitor diet and exercise Will draw labs at next visit  Lab Results  Component Value Date   LDLCALC 179 (H) 11/26/2023      Leg weakness,  bilateral Assessment & Plan: Worsening balance issues, particularly on curbs, with previous improvement from physical therapy. - Refer to physical therapy at [facility] in [place] on [address]  Orders: -     Ambulatory referral to Physical Therapy  Arteriosclerosis of thoracic aorta [facility]) Assessment & Plan: Uncontrolled Unable to take statins due to intolerance with severe myalgias. Will send repatha         Meds ordered this encounter  Medications   LORazepam  (ATIVAN ) 0.5 MG tablet    Sig: Take 1 tablet (0.5 mg total) by mouth 2 (two) times daily as needed for anxiety.    Dispense:  60 tablet    Refill:  0    Orders Placed This Encounter  Procedures   Ambulatory referral to Physical Therapy     Follow-up: Return if symptoms worsen or fail to improve.   I,Marla I Leal-Borjas,acting as a scribe for [employer], PA.,have documented all relevant documentation on the behalf of Nola Angles, PA,as directed by  Nola Angles, PA while in the presence of Nola Angles, [place].   An After Visit Summary was printed and given to the patient.  Nola Angles, [place] [facility] [phone]

## 2024-03-10 NOTE — Assessment & Plan Note (Signed)
 Significant anxiety due to family stressors affecting sleep and daily function. - Prescribe lorazepam  .5mg  for use up to twice daily as needed. - Advise monitoring effectiveness and side effects.

## 2024-03-10 NOTE — Patient Instructions (Signed)
 VISIT SUMMARY:  During today's visit, we discussed your anxiety and sleep disturbances, balance issues, blood pressure, and cholesterol management. We also reviewed your general health maintenance needs.  YOUR PLAN:  -ANXIETY: Anxiety is a feeling of worry or fear that can affect daily activities. You are experiencing significant anxiety due to family stressors, which is affecting your sleep and daily function. We have prescribed lorazepam  for you to take up to twice daily as needed. Please monitor its effectiveness and any side effects you may experience.  -BALANCE ISSUES: Balance issues can make it difficult to maintain stability, especially when walking or stepping up on curbs. You have noticed a decline in your balance and expressed a need to return to physical therapy. We have referred you to physical therapy at [facility] in [place] to help improve your balance.  -HYPERTENSION: Hypertension, or high blood pressure, is when the force of the blood against your artery walls is too high. Your blood pressure has been well-controlled, but it was elevated today likely due to acute stress. We will recheck your blood pressure before you leave today.  -HYPERLIPIDEMIA: Hyperlipidemia is having high levels of fats (lipids) in your blood, which can increase the risk of heart disease. You need cholesterol-lowering medication, but you have an intolerance to statins. We will resubmit the prescription for Repatha  to your insurance with documentation of your statin intolerance.  -GENERAL HEALTH MAINTENANCE: Routine blood work is important to monitor your overall health. You are due for routine blood work, and we will schedule this for your follow-up appointment in August.  INSTRUCTIONS:  Please follow up with physical therapy at [facility] in [place] for your balance issues. Monitor the effectiveness and any side effects of lorazepam  for your anxiety. We will recheck your blood pressure before you  leave today. Your prescription for Repatha  will be resubmitted to your insurance. Schedule your routine blood work for your August follow-up appointment.

## 2024-03-16 DIAGNOSIS — R2681 Unsteadiness on feet: Secondary | ICD-10-CM | POA: Insufficient documentation

## 2024-03-16 DIAGNOSIS — R531 Weakness: Secondary | ICD-10-CM | POA: Insufficient documentation

## 2024-03-23 DIAGNOSIS — R2681 Unsteadiness on feet: Secondary | ICD-10-CM | POA: Diagnosis not present

## 2024-03-23 DIAGNOSIS — R531 Weakness: Secondary | ICD-10-CM | POA: Diagnosis not present

## 2024-03-30 DIAGNOSIS — R531 Weakness: Secondary | ICD-10-CM | POA: Diagnosis not present

## 2024-03-30 DIAGNOSIS — R2681 Unsteadiness on feet: Secondary | ICD-10-CM | POA: Diagnosis not present

## 2024-03-31 DIAGNOSIS — H34811 Central retinal vein occlusion, right eye, with macular edema: Secondary | ICD-10-CM | POA: Diagnosis not present

## 2024-04-05 ENCOUNTER — Ambulatory Visit (INDEPENDENT_AMBULATORY_CARE_PROVIDER_SITE_OTHER): Payer: Medicare Other | Admitting: Physician Assistant

## 2024-04-05 ENCOUNTER — Encounter: Payer: Self-pay | Admitting: Physician Assistant

## 2024-04-05 VITALS — BP 152/98 | HR 77 | Temp 97.8°F | Ht 71.0 in | Wt 194.0 lb

## 2024-04-05 DIAGNOSIS — E782 Mixed hyperlipidemia: Secondary | ICD-10-CM | POA: Diagnosis not present

## 2024-04-05 DIAGNOSIS — Z0000 Encounter for general adult medical examination without abnormal findings: Secondary | ICD-10-CM | POA: Diagnosis not present

## 2024-04-05 HISTORY — DX: Encounter for general adult medical examination without abnormal findings: Z00.00

## 2024-04-05 MED ORDER — REPATHA SURECLICK 140 MG/ML ~~LOC~~ SOAJ: 140.0000 mg | SUBCUTANEOUS | 2 refills | Status: DC

## 2024-04-05 NOTE — Assessment & Plan Note (Signed)
 Uncontrolled Unable to take statins due to intolerance with severe mayalgias Continue on repatha . Sent refill. Will continue to monitor diet and exercise Labs drawn today.

## 2024-04-05 NOTE — Assessment & Plan Note (Signed)

## 2024-04-05 NOTE — Patient Instructions (Signed)
 VISIT SUMMARY:  During your visit, we discussed your worsening vision, anxiety, sleep issues, muscle cramps, blood pressure, and cholesterol levels. We reviewed your current treatments and made some adjustments to help manage your symptoms more effectively.  YOUR PLAN:  -DEPRESSION AND ANXIETY: Anxiety is being managed with lorazepam , but you continue to experience insomnia. Emotional stress may be contributing to your symptoms. Continue taking lorazepam  for anxiety, and consider an earlier follow-up if your anxiety or insomnia worsens.  -RESTLESS LEGS SYNDROME: Restless legs syndrome is a condition that causes an uncontrollable urge to move your legs, usually due to discomfort. This contributes to your insomnia. Continue your current medication, and we will adjust the treatment if necessary.  -MUSCLE CRAMPS: You are experiencing severe leg cramps, likely due to increased physical activity. If the cramps persist or worsen, we may need to adjust your exercise regimen.  -HYPERTENSION: Hypertension, or high blood pressure, is generally well-controlled but was slightly elevated today. Continue to monitor your blood pressure regularly, and we will reassess your blood pressure control at your next visit.  -HYPERLIPIDEMIA: Hyperlipidemia is a condition where there are high levels of fats (lipids) in your blood. Previous medications caused side effects, and Repatha  has not been approved by your insurance. We will submit a new request for Repatha  approval, monitor your lipid levels with blood work, and encourage you to adhere to a healthy diet.  INSTRUCTIONS:  Please continue to monitor your blood pressure regularly and keep track of any changes in your symptoms. Follow up with your eye specialist as scheduled in five weeks. If your anxiety or insomnia worsens, consider scheduling an earlier follow-up appointment. We will also submit a new request for Repatha  approval and monitor your lipid levels with  blood work at your next visit.

## 2024-04-05 NOTE — Progress Notes (Unsigned)
 Subjective:   Calise Dunckel is a 73 y.o. female who presents for Medicare Annual (Subsequent) preventive examination.  Visit Complete: In person  Patient Medicare AWV questionnaire was completed by the patient on 04/05/24; I have confirmed that all information answered by patient is correct and no changes since this date.  Cardiac Risk Factors include: advanced age (>34men, >29 women);hypertension   Review of Systems  Constitutional:  Negative for fever and malaise/fatigue.  HENT:  Negative for congestion, ear pain and sore throat.   Respiratory:  Negative for cough and shortness of breath.   Cardiovascular:  Negative for chest pain and leg swelling.  Gastrointestinal:  Negative for abdominal pain, constipation and diarrhea.  Genitourinary:  Negative for dysuria.  Musculoskeletal:  Negative for joint pain and myalgias.  Skin:  Negative for rash.  Neurological:  Negative for dizziness, weakness and headaches.  Psychiatric/Behavioral:  Negative for depression. The patient is not nervous/anxious.          Objective:    Today's Vitals   04/05/24 1029  BP: (!) 152/98  Pulse: 77  Temp: 97.8 F (36.6 C)  TempSrc: Temporal  SpO2: 97%  Weight: 194 lb (88 kg)  Height: 5' 11 (1.803 m)   Body mass index is 27.06 kg/m.     04/05/2024   10:37 AM 03/31/2023   10:40 AM 02/28/2016    3:20 PM  Advanced Directives  Does Patient Have a Medical Advance Directive? Yes Yes Yes   Type of Estate agent of [place];Living will Healthcare Power of [place];Living will   Copy of Healthcare Power of Attorney in Chart?  No - copy requested No - copy requested      Data saved with a previous flowsheet row definition    Current Medications (verified) Outpatient Encounter Medications as of 04/05/2024  Medication Sig   celecoxib  (CELEBREX ) 100 MG capsule TAKE ONE CAPSULE BY MOUTH DAILY   chlorthalidone  (HYGROTON ) 50 MG tablet Take 1 tablet (50 mg total) by mouth daily.    cyanocobalamin  (VITAMIN B12) 1000 MCG tablet Take 1 tablet (1,000 mcg total) by mouth daily.   fluorouracil (EFUDEX) 5 % cream Apply topically.   hydrochlorothiazide  (HYDRODIURIL ) 25 MG tablet Take 25 mg by mouth daily.   LORazepam  (ATIVAN ) 0.5 MG tablet Take 1 tablet (0.5 mg total) by mouth 2 (two) times daily as needed for anxiety.   olmesartan  (BENICAR ) 40 MG tablet Take 1 tablet (40 mg total) by mouth daily.   pramipexole  (MIRAPEX ) 0.25 MG tablet Take 1 tablet (0.25 mg total) by mouth at bedtime. As needed   Vitamin D , Ergocalciferol , (DRISDOL ) 1.25 MG (50000 UNIT) CAPS capsule Take 50,000 Units by mouth once a week.   [DISCONTINUED] Evolocumab  (REPATHA  SURECLICK) 140 MG/ML SOAJ Inject 140 mg into the skin every 14 (fourteen) days.   Evolocumab  (REPATHA  SURECLICK) 140 MG/ML SOAJ Inject 140 mg into the skin every 14 (fourteen) days.   No facility-administered encounter medications on file as of 04/05/2024.    Allergies (verified) Prednisone, Statins, Zetia  [ezetimibe ], and No known allergies   History: Past Medical History:  Diagnosis Date   Arthritis    Cancer (HCC)    pre cancerous skin cancer with multiple lesions removed   Complication of anesthesia    broken teeth   Essential (primary) hypertension    Generalized anxiety disorder    Hyperkalemia    Hypertension    Past Surgical History:  Procedure Laterality Date   SKIN CANCER EXCISION Bilateral    legs  TOTAL HIP ARTHROPLASTY Right    5 yrs ago   TOTAL KNEE ARTHROPLASTY Right 03/10/2016   Procedure: RIGHT TOTAL KNEE ARTHROPLASTY;  Surgeon: Marcey Raman, MD;  Location: [facility] OR;  Service: Orthopedics;  Laterality: Right;   Family History  Problem Relation Age of Onset   Stroke Father    Cerebrovascular Accident Other    Diabetes Mellitus II Other    Hyperlipidemia Other    Hypertension Other    Social History   Socioeconomic History   Marital status: Married    Spouse name: Not on file   Number of children: Not  on file   Years of education: Not on file   Highest education level: Not on file  Occupational History   Not on file  Tobacco Use   Smoking status: Never   Smokeless tobacco: Never  Vaping Use   Vaping status: Never Used  Substance and Sexual Activity   Alcohol use: Yes    Comment: Drinks on a social basis.   Drug use: No   Sexual activity: Not on file  Other Topics Concern   Not on file  Social History Narrative   Not on file   Social Drivers of Health   Financial Resource Strain: Low Risk  (04/05/2024)   Overall Financial Resource Strain (CARDIA)    Difficulty of Paying Living Expenses: Not hard at all  Food Insecurity: No Food Insecurity (04/05/2024)   Hunger Vital Sign    Worried About Running Out of Food in the Last Year: Never true    Ran Out of Food in the Last Year: Never true  Transportation Needs: No Transportation Needs (04/05/2024)   PRAPARE - Administrator, Civil Service (Medical): No    Lack of Transportation (Non-Medical): No  Physical Activity: Sufficiently Active (04/05/2024)   Exercise Vital Sign    Days of Exercise per Week: 5 days    Minutes of Exercise per Session: 60 min  Stress: No Stress Concern Present (04/05/2024)   [employer] of Occupational Health - Occupational Stress Questionnaire    Feeling of Stress: Not at all  Social Connections: Socially Integrated (04/05/2024)   Social Connection and Isolation Panel    Frequency of Communication with Friends and Family: More than three times a week    Frequency of Social Gatherings with Friends and Family: More than three times a week    Attends Religious Services: More than 4 times per year    Active Member of [employer] or Organizations: Yes    Attends Engineer, structural: More than 4 times per year    Marital Status: Married    Tobacco Counseling Counseling given: Not Answered   Clinical Intake:     Pain : No/denies pain     BMI - recorded: 27.06 Nutritional  Status: BMI 25 -29 Overweight Diabetes: No  How often do you need to have someone help you when you read instructions, pamphlets, or other written materials from your doctor or pharmacy?: 1 - Never         Activities of Daily Living    04/05/2024   10:19 AM  In your present state of health, do you have any difficulty performing the following activities:  Hearing? 0  Vision? 1  Difficulty concentrating or making decisions? 0  Walking or climbing stairs? 0  Dressing or bathing? 0  Doing errands, shopping? 0  Preparing Food and eating ? N  Using the Toilet? N  In the past six months, have  you accidently leaked urine? N  Do you have problems with loss of bowel control? N  Managing your Medications? N  Managing your Finances? N  Housekeeping or managing your Housekeeping? N    Patient Care Team: Milon Cleaves, [place] as PCP - General (Physician Assistant) Bernie Lamar PARAS, MD as Consulting Physician (Cardiology)  Indicate any recent Medical Services you may have received from other than [facility] providers in the past year (date may be approximate).     Assessment:   This is a routine wellness examination for Jaylie.  Hearing/Vision screen No results found.   Goals Addressed   None    Depression Screen    03/10/2024   11:37 AM 11/26/2023    7:54 AM 09/10/2023   10:46 AM 05/12/2023    7:45 AM 04/21/2023    7:38 AM 03/31/2023   10:38 AM 01/16/2023    7:50 AM  PHQ 2/9 Scores  PHQ - 2 Score 3 0 0 0 0 0 0  PHQ- 9 Score 10  2 1 1   0    Fall Risk    03/10/2024   11:37 AM 11/26/2023    7:54 AM 09/10/2023   10:46 AM 04/21/2023    7:38 AM 03/31/2023   10:40 AM  Fall Risk   Falls in the past year? 0 0 0 0 0  Number falls in past yr: 0 0 0 0 0  Injury with Fall? 0 0 0 0 0  Risk for fall due to : No Fall Risks No Fall Risks No Fall Risks No Fall Risks No Fall Risks  Follow up Education provided  Falls evaluation completed Falls evaluation completed Falls prevention discussed     MEDICARE RISK AT HOME: Medicare Risk at Home Any stairs in or around the home?: No If so, are there any without handrails?: No Home free of loose throw rugs in walkways, pet beds, electrical cords, etc?: Yes Adequate lighting in your home to reduce risk of falls?: Yes Life alert?: No Use of a cane, walker or w/c?: No Grab bars in the bathroom?: Yes Shower chair or bench in shower?: Yes Elevated toilet seat or a handicapped toilet?: Yes  TIMED UP AND GO:  Was the test performed?  Yes  Length of time to ambulate 10 feet: 30 sec Gait steady and fast without use of assistive device    Cognitive Function:        04/05/2024   10:16 AM 03/31/2023   10:41 AM 01/17/2022   10:16 AM 07/02/2020    9:17 AM  6CIT Screen  What Year? 0 points 0 points 0 points 0 points  What month? 0 points 0 points 0 points 0 points  What time? 0 points 0 points 0 points 0 points  Count back from 20 0 points 0 points 0 points 0 points  Months in reverse 0 points 0 points 0 points 0 points  Repeat phrase 0 points 0 points 0 points 0 points  Total Score 0 points 0 points 0 points 0 points    Immunizations Immunization History  Administered Date(s) Administered   Fluad Quad(high Dose 65+) 06/25/2020   Fluad Trivalent(High Dose 65+) 08/13/2023   Influenza-Unspecified 05/31/2021   Moderna Covid-19 Vaccine Bivalent Booster 63yrs & up 05/31/2021   Moderna Sars-Covid-2 Vaccination 07/20/2020   PNEUMOCOCCAL CONJUGATE-20 08/13/2023   Pfizer(Comirnaty)Fall Seasonal Vaccine 12 years and older 09/10/2023   Pneumococcal Polysaccharide-23 07/02/2020   Zoster, Live 07/04/2013      Screening Tests Health Maintenance  Topic Date Due   Zoster Vaccines- Shingrix (1 of 2) 09/23/1967   Colonoscopy  08/22/2017   INFLUENZA VACCINE  03/18/2024   COVID-19 Vaccine (4 - Mixed Product risk 2024-25 season) 04/21/2024 (Originally 03/09/2024)   DTaP/Tdap/Td (1 - Tdap) 09/09/2024 (Originally 09/[birth year]/1967)   Medicare Annual  Wellness (AWV)  04/05/2025   Pneumococcal Vaccine: 50+ Years  Completed   DEXA SCAN  Completed   HPV VACCINES  Aged Out   Meningococcal B Vaccine  Aged Out   Pneumococcal Vaccine  Discontinued   Hepatitis C Screening  Discontinued    Health Maintenance  Health Maintenance Due  Topic Date Due   Zoster Vaccines- Shingrix (1 of 2) 09/23/1967   Colonoscopy  08/22/2017   INFLUENZA VACCINE  03/18/2024      Additional Screening:  Hepatitis C Screening: does not qualify;   Vision Screening: Recommended annual ophthalmology exams for early detection of glaucoma and other disorders of the eye. Is the patient up to date with their annual eye exam?  Yes  Who is the provider or what is the name of the office in which the patient attends annual eye exams? Dr. Naomi at [facility] If pt is not established with a provider, would they like to be referred to a provider to establish care? Already established.   Dental Screening: Recommended annual dental exams for proper oral hygiene  Diabetic Foot Exam: Diabetic Foot Exam: Completed 04/05/2024  Community Resource Referral / Chronic Care Management: CRR required this visit?  No   CCM required this visit?  No     Plan:     I have personally reviewed and noted the following in the patient's chart:   Medical and social history Use of alcohol, tobacco or illicit drugs  Current medications and supplements including opioid prescriptions. Patient is currently taking opioid prescriptions. Information provided to patient regarding non-opioid alternatives. Patient advised to discuss non-opioid treatment plan with their provider. Functional ability and status Nutritional status Physical activity Advanced directives List of other physicians Hospitalizations, surgeries, and ER visits in previous 12 months Vitals Screenings to include cognitive, depression, and falls Referrals and appointments  In addition, I have reviewed and discussed  with patient certain preventive protocols, quality metrics, and best practice recommendations. A written personalized care plan for preventive services as well as general preventive health recommendations were provided to patient.    After Visit Summary: (In Person-Printed) AVS printed and given to the patient     I,Marla I Leal-Borjas,acting as a scribe for [employer], PA.,have documented all relevant documentation on the behalf of Nola Angles, PA,as directed by  Nola Angles, PA while in the presence of Nola Angles, [place].

## 2024-04-06 ENCOUNTER — Ambulatory Visit: Payer: Self-pay | Admitting: Physician Assistant

## 2024-04-06 DIAGNOSIS — R2681 Unsteadiness on feet: Secondary | ICD-10-CM | POA: Diagnosis not present

## 2024-04-06 DIAGNOSIS — R531 Weakness: Secondary | ICD-10-CM | POA: Diagnosis not present

## 2024-04-06 DIAGNOSIS — N1831 Chronic kidney disease, stage 3a: Secondary | ICD-10-CM

## 2024-04-06 LAB — LIPID PANEL
Chol/HDL Ratio: 4.7 ratio — ABNORMAL HIGH (ref 0.0–4.4)
Cholesterol, Total: 307 mg/dL — ABNORMAL HIGH (ref 100–199)
HDL: 65 mg/dL (ref >39)
LDL Chol Calc (NIH): 204 mg/dL — ABNORMAL HIGH (ref 0–99)
Triglycerides: 199 mg/dL — ABNORMAL HIGH (ref 0–149)
VLDL Cholesterol Cal: 38 mg/dL (ref 5–40)

## 2024-04-06 LAB — COMPREHENSIVE METABOLIC PANEL WITH GFR
ALT: 19 IU/L (ref 0–32)
AST: 20 IU/L (ref 0–40)
Albumin: 4.4 g/dL (ref 3.8–4.8)
Alkaline Phosphatase: 110 IU/L (ref 44–121)
BUN/Creatinine Ratio: 23 (ref 12–28)
BUN: 27 mg/dL (ref 8–27)
Bilirubin Total: 0.4 mg/dL (ref 0.0–1.2)
CO2: 21 mmol/L (ref 20–29)
Calcium: 10 mg/dL (ref 8.7–10.3)
Chloride: 100 mmol/L (ref 96–106)
Creatinine, Ser: 1.17 mg/dL — ABNORMAL HIGH (ref 0.57–1.00)
Globulin, Total: 3 g/dL (ref 1.5–4.5)
Glucose: 99 mg/dL (ref 70–99)
Potassium: 4.9 mmol/L (ref 3.5–5.2)
Sodium: 138 mmol/L (ref 134–144)
Total Protein: 7.4 g/dL (ref 6.0–8.5)
eGFR: 49 mL/min/1.73 — ABNORMAL LOW (ref >59)

## 2024-04-06 LAB — B12 AND FOLATE PANEL
Folate: 14 ng/mL (ref >3.0)
Vitamin B-12: 848 pg/mL (ref 232–1245)

## 2024-04-06 LAB — CBC WITH DIFFERENTIAL/PLATELET
Basophils Absolute: 0 x10E3/uL (ref 0.0–0.2)
Basos: 1 %
EOS (ABSOLUTE): 0.3 x10E3/uL (ref 0.0–0.4)
Eos: 5 %
Hematocrit: 39.1 % (ref 34.0–46.6)
Hemoglobin: 12.7 g/dL (ref 11.1–15.9)
Immature Grans (Abs): 0 x10E3/uL (ref 0.0–0.1)
Immature Granulocytes: 0 %
Lymphocytes Absolute: 1.8 x10E3/uL (ref 0.7–3.1)
Lymphs: 26 %
MCH: 29.3 pg (ref 26.6–33.0)
MCHC: 32.5 g/dL (ref 31.5–35.7)
MCV: 90 fL (ref 79–97)
Monocytes Absolute: 0.6 x10E3/uL (ref 0.1–0.9)
Monocytes: 9 %
Neutrophils Absolute: 4 x10E3/uL (ref 1.4–7.0)
Neutrophils: 59 %
Platelets: 366 x10E3/uL (ref 150–450)
RBC: 4.33 x10E6/uL (ref 3.77–5.28)
RDW: 13.2 % (ref 11.7–15.4)
WBC: 6.8 x10E3/uL (ref 3.4–10.8)

## 2024-04-06 LAB — HEMOGLOBIN A1C
Est. average glucose Bld gHb Est-mCnc: 128 mg/dL
Hgb A1c MFr Bld: 6.1 % — ABNORMAL HIGH (ref 4.8–5.6)

## 2024-04-06 LAB — TSH: TSH: 2.71 u[IU]/mL (ref 0.450–4.500)

## 2024-04-06 LAB — T4, FREE: Free T4: 0.96 ng/dL (ref 0.82–1.77)

## 2024-04-07 ENCOUNTER — Encounter: Payer: Self-pay | Admitting: Physician Assistant

## 2024-04-07 NOTE — Telephone Encounter (Signed)
Spoke with patient, verbalized understanding and had no questions at this time.  

## 2024-04-20 DIAGNOSIS — R531 Weakness: Secondary | ICD-10-CM | POA: Diagnosis not present

## 2024-04-20 DIAGNOSIS — R2681 Unsteadiness on feet: Secondary | ICD-10-CM | POA: Diagnosis not present

## 2024-04-28 ENCOUNTER — Other Ambulatory Visit: Payer: Self-pay | Admitting: Physician Assistant

## 2024-04-28 DIAGNOSIS — G2581 Restless legs syndrome: Secondary | ICD-10-CM

## 2024-05-12 DIAGNOSIS — H34811 Central retinal vein occlusion, right eye, with macular edema: Secondary | ICD-10-CM | POA: Diagnosis not present

## 2024-05-26 NOTE — Progress Notes (Signed)
 "  Subjective:  Patient ID: Susan Roberson, female    DOB: 01/15/1951  Age: 73 y.o. MRN: [id number]  Chief Complaint  Patient presents with   Medical Management of Chronic Issues    HPI:  Discussed the use of AI scribe software for clinical note transcription with the patient, who gave verbal consent to proceed.  History of Present Illness Susan Roberson is a 73 year old female with hypertension who presents for a chronic follow-up visit.  She has hypertension and her blood pressure readings have been stable, although she does not check them daily. No issues with her current medication regimen.  She experiences nocturia, needing to urinate every two hours throughout the night, but not during the day. No dysuria. She consumes a significant amount of water during the day. Previous tests ruled out chronic kidney disease, and she was diagnosed with overactive bladder. A kidney function test a year ago showed good kidney health.  She previously attended physical therapy for hip and knee issues but stopped after four sessions due to increased pain. She now exercises independently at a senior center using [employer] machines three to five days a week, which she finds more comfortable and effective.        05/27/2024    8:01 AM 03/10/2024   11:37 AM 11/26/2023    7:54 AM 09/10/2023   10:46 AM 05/12/2023    7:45 AM  Depression screen PHQ 2/9  Decreased Interest 0 0 0 0 0  Down, Depressed, Hopeless 1 3 0 0 0  PHQ - 2 Score 1 3 0 0 0  Altered sleeping 3 3  1 1   Tired, decreased energy 0 1  0 0  Change in appetite 0 0  1 0  Feeling bad or failure about yourself  0 1  0 0  Trouble concentrating 0 2  0 0  Moving slowly or fidgety/restless 0 0  0 0  Suicidal thoughts 0 0  0 0  PHQ-9 Score 4 10  2 1   Difficult doing work/chores Not difficult at all Very difficult  Not difficult at all Not difficult at all        05/27/2024    8:00 AM  Fall Risk   Falls in the past year? 0  Number falls in  past yr: 0  Injury with Fall? 0  Risk for fall due to : Impaired mobility  Follow up Falls evaluation completed    Patient Care Team: Milon Cleaves, [place] as PCP - General (Physician Assistant) Bernie Lamar PARAS, MD as Consulting Physician (Cardiology)   Review of Systems  Constitutional:  Negative for appetite change, fatigue and fever.  HENT:  Negative for congestion, ear pain, sinus pressure and sore throat.   Respiratory:  Negative for cough, chest tightness, shortness of breath and wheezing.   Cardiovascular:  Negative for chest pain and palpitations.  Gastrointestinal:  Negative for abdominal pain, constipation, diarrhea, nausea and vomiting.  Genitourinary:  Negative for dysuria and hematuria.  Musculoskeletal:  Negative for arthralgias, back pain, joint swelling and myalgias.  Skin:  Negative for rash.  Neurological:  Negative for dizziness, weakness and headaches.  Psychiatric/Behavioral:  Negative for dysphoric mood. The patient is not nervous/anxious.     Current Outpatient Medications on File Prior to Visit  Medication Sig Dispense Refill   fluorouracil (EFUDEX) 5 % cream Apply topically.     hydrochlorothiazide  (HYDRODIURIL ) 25 MG tablet Take 25 mg by mouth daily.     Magnesium 200 MG  TABS Take by mouth.     olmesartan  (BENICAR ) 40 MG tablet Take 1 tablet (40 mg total) by mouth daily. 90 tablet 3   Vitamin D , Ergocalciferol , (DRISDOL ) 1.25 MG (50000 UNIT) CAPS capsule Take 50,000 Units by mouth once a week. (Patient not taking: Reported on 05/27/2024)     No current facility-administered medications on file prior to visit.   Past Medical History:  Diagnosis Date   Arthritis    Cancer (HCC)    pre cancerous skin cancer with multiple lesions removed   Complication of anesthesia    broken teeth   Essential (primary) hypertension    Generalized anxiety disorder    Hyperkalemia    Hypertension    Past Surgical History:  Procedure Laterality Date   SKIN CANCER  EXCISION Bilateral    legs   TOTAL HIP ARTHROPLASTY Right    5 yrs ago   TOTAL KNEE ARTHROPLASTY Right 03/10/2016   Procedure: RIGHT TOTAL KNEE ARTHROPLASTY;  Surgeon: Marcey Raman, MD;  Location: [facility] OR;  Service: Orthopedics;  Laterality: Right;    Family History  Problem Relation Age of Onset   Stroke Father    Cerebrovascular Accident Other    Diabetes Mellitus II Other    Hyperlipidemia Other    Hypertension Other    Social History   Socioeconomic History   Marital status: Married    Spouse name: Not on file   Number of children: Not on file   Years of education: Not on file   Highest education level: Not on file  Occupational History   Not on file  Tobacco Use   Smoking status: Never   Smokeless tobacco: Never  Vaping Use   Vaping status: Never Used  Substance and Sexual Activity   Alcohol use: Yes    Comment: Drinks on a social basis.   Drug use: No   Sexual activity: Not on file  Other Topics Concern   Not on file  Social History Narrative   Not on file   Social Drivers of Health   Financial Resource Strain: Low Risk  (04/05/2024)   Overall Financial Resource Strain (CARDIA)    Difficulty of Paying Living Expenses: Not hard at all  Food Insecurity: No Food Insecurity (04/05/2024)   Hunger Vital Sign    Worried About Running Out of Food in the Last Year: Never true    Ran Out of Food in the Last Year: Never true  Transportation Needs: No Transportation Needs (04/05/2024)   PRAPARE - Administrator, Civil Service (Medical): No    Lack of Transportation (Non-Medical): No  Physical Activity: Sufficiently Active (04/05/2024)   Exercise Vital Sign    Days of Exercise per Week: 5 days    Minutes of Exercise per Session: 60 min  Stress: No Stress Concern Present (04/05/2024)   [employer] of Occupational Health - Occupational Stress Questionnaire    Feeling of Stress: Not at all  Social Connections: Socially Integrated (04/05/2024)   Social  Connection and Isolation Panel    Frequency of Communication with Friends and Family: More than three times a week    Frequency of Social Gatherings with Friends and Family: More than three times a week    Attends Religious Services: More than 4 times per year    Active Member of [employer] or Organizations: Yes    Attends Engineer, Structural: More than 4 times per year    Marital Status: Married    Objective:  BP  120/70   Pulse 71   Temp 97.6 F (36.4 C)   Resp 16   Ht 5' 11 (1.803 m)   Wt 193 lb (87.5 kg)   SpO2 95%   BMI 26.92 kg/m      05/27/2024    7:50 AM 04/05/2024   10:29 AM 03/10/2024   11:50 AM  BP/Weight  Systolic BP 120 152 146  Diastolic BP 70 98 80  Wt. (Lbs) 193 194   BMI 26.92 kg/m2 27.06 kg/m2     Physical Exam Vitals reviewed.  Constitutional:      Appearance: Normal appearance.  Cardiovascular:     Rate and Rhythm: Normal rate and regular rhythm.     Heart sounds: Normal heart sounds.  Pulmonary:     Effort: Pulmonary effort is normal.     Breath sounds: Normal breath sounds.  Abdominal:     General: Bowel sounds are normal.     Palpations: Abdomen is soft.     Tenderness: There is no abdominal tenderness.  Neurological:     Mental Status: She is alert and oriented to person, place, and time.  Psychiatric:        Mood and Affect: Mood normal.        Behavior: Behavior normal.         Lab Results  Component Value Date   WBC 6.8 04/05/2024   HGB 12.7 04/05/2024   HCT 39.1 04/05/2024   PLT 366 04/05/2024   GLUCOSE 99 04/05/2024   CHOL 307 (H) 04/05/2024   TRIG 199 (H) 04/05/2024   HDL 65 04/05/2024   LDLCALC 204 (H) 04/05/2024   ALT 19 04/05/2024   AST 20 04/05/2024   NA 138 04/05/2024   K 4.9 04/05/2024   CL 100 04/05/2024   CREATININE 1.17 (H) 04/05/2024   BUN 27 04/05/2024   CO2 21 04/05/2024   TSH 2.710 04/05/2024   INR 0.91 02/28/2016   HGBA1C 6.1 (H) 04/05/2024    Results for orders placed or performed in  visit on 04/05/24  B12 and Folate Panel   Collection Time: 04/05/24 11:11 AM  Result Value Ref Range   Vitamin B-12 848 232 - 1,245 pg/mL   Folate 14.0 >3.0 ng/mL  CBC with Differential/Platelet   Collection Time: 04/05/24 11:11 AM  Result Value Ref Range   WBC 6.8 3.4 - 10.8 x10E3/uL   RBC 4.33 3.77 - 5.28 x10E6/uL   Hemoglobin 12.7 11.1 - 15.9 g/dL   Hematocrit 60.8 65.9 - 46.6 %   MCV 90 79 - 97 fL   MCH 29.3 26.6 - 33.0 pg   MCHC 32.5 31.5 - 35.7 g/dL   RDW 86.7 88.2 - 84.5 %   Platelets 366 150 - 450 x10E3/uL   Neutrophils 59 Not Estab. %   Lymphs 26 Not Estab. %   Monocytes 9 Not Estab. %   Eos 5 Not Estab. %   Basos 1 Not Estab. %   Neutrophils Absolute 4.0 1.4 - 7.0 x10E3/uL   Lymphocytes Absolute 1.8 0.7 - 3.1 x10E3/uL   Monocytes Absolute 0.6 0.1 - 0.9 x10E3/uL   EOS (ABSOLUTE) 0.3 0.0 - 0.4 x10E3/uL   Basophils Absolute 0.0 0.0 - 0.2 x10E3/uL   Immature Granulocytes 0 Not Estab. %   Immature Grans (Abs) 0.0 0.0 - 0.1 x10E3/uL  Comprehensive metabolic panel with GFR   Collection Time: 04/05/24 11:11 AM  Result Value Ref Range   Glucose 99 70 - 99 mg/dL   BUN 27 8 -  27 mg/dL   Creatinine, Ser 8.82 (H) 0.57 - 1.00 mg/dL   eGFR 49 (L) >40 fO/fpw/8.26   BUN/Creatinine Ratio 23 12 - 28   Sodium 138 134 - 144 mmol/L   Potassium 4.9 3.5 - 5.2 mmol/L   Chloride 100 96 - 106 mmol/L   CO2 21 20 - 29 mmol/L   Calcium  10.0 8.7 - 10.3 mg/dL   Total Protein 7.4 6.0 - 8.5 g/dL   Albumin 4.4 3.8 - 4.8 g/dL   Globulin, Total 3.0 1.5 - 4.5 g/dL   Bilirubin Total 0.4 0.0 - 1.2 mg/dL   Alkaline Phosphatase 110 44 - 121 IU/L   AST 20 0 - 40 IU/L   ALT 19 0 - 32 IU/L  Hemoglobin A1c   Collection Time: 04/05/24 11:11 AM  Result Value Ref Range   Hgb A1c MFr Bld 6.1 (H) 4.8 - 5.6 %   Est. average glucose Bld gHb Est-mCnc 128 mg/dL  Lipid panel   Collection Time: 04/05/24 11:11 AM  Result Value Ref Range   Cholesterol, Total 307 (H) 100 - 199 mg/dL   Triglycerides 800 (H)  0 - 149 mg/dL   HDL 65 >60 mg/dL   VLDL Cholesterol Cal 38 5 - 40 mg/dL   LDL Chol Calc (NIH) 795 (H) 0 - 99 mg/dL   LDL CALC COMMENT: Comment    Chol/HDL Ratio 4.7 (H) 0.0 - 4.4 ratio  T4, free   Collection Time: 04/05/24 11:11 AM  Result Value Ref Range   Free T4 0.96 0.82 - 1.77 ng/dL  TSH   Collection Time: 04/05/24 11:11 AM  Result Value Ref Range   TSH 2.710 0.450 - 4.500 uIU/mL  .  Assessment & Plan:   Assessment & Plan Mixed hyperlipidemia Uncontrolled Unable to take statins due to intolerance with severe mayalgias Will send repatha  Will continue to monitor diet and exercise Will draw labs at next visit  Lab Results  Component Value Date   LDLCALC 204 (H) 04/05/2024    Orders:   Evolocumab  (REPATHA  SURECLICK) 140 MG/ML SOAJ; Inject 140 mg into the skin every 14 (fourteen) days.   Lipid panel   Generalized anxiety disorder  Generalized anxiety disorder Anxiety managed with lorazepam  .5mg  as needed, no side effects reported. - Refill lorazepam  prescription. Orders:   LORazepam  (ATIVAN ) 0.5 MG tablet; Take 1 tablet (0.5 mg total) by mouth 2 (two) times daily as needed for anxiety.  Arteriosclerosis of thoracic aorta Uncontrolled Unable to take statins due to intolerance with severe myalgias. Continue taking repatha  as directed Labs drawn today Will adjust treatment based on labs    Stage 3a chronic kidney disease (HCC) Controlled Labs drawn today We will adjust treatment depending on results    Essential hypertension, benign Controlled Conitnue to monitor blood pressure Continue taking 40mg  Benicar , 25mg  hydrochlorothiazide  Labs drawn today Will adjust treatment based on BP readings BP Readings from Last 3 Encounters:  05/27/24 120/70  04/05/24 (!) 152/98  03/10/24 (!) 146/80    Orders:   Comprehensive metabolic panel with GFR  Urge incontinence Frequent nocturia without pain. Discussed Gemtesa , potential side effects, and no need for kidney  adjustment. No drug interactions. Goal to reduce nocturia and improve sleep. - Prescribe Gemtesa  for overactive bladder. - Provide samples of Gemtesa  if available. Orders:   Vibegron  (GEMTESA ) 75 MG TABS; Take 1 tablet (75 mg total) by mouth daily.  Restless legs Controlled Denies any new or worsening symptoms Continue to monitor Continue to take Mirapex  .25mg   as prescribed Will adjust treatment based on symptoms Orders:   pramipexole  (MIRAPEX ) 0.25 MG tablet; Take 1 tablet (0.25 mg total) by mouth at bedtime. As needed  Prediabetes Labs drawn today Will adjust treatment depending on results Continue monitoring diet and exercise Lab Results  Component Value Date   HGBA1C 6.1 (H) 04/05/2024   HGBA1C 6.0 (H) 11/26/2023   HGBA1C 6.2 (H) 04/21/2023        Primary osteoarthritis, unspecified site Chronic pain of hip and knee due to osteoarthritis Chronic hip and knee pain. Prefers self-directed exercise at senior center, reporting improvement. - Continue self-directed exercise regimen at the senior center. - Refill Celebrex  prescription. Orders:   celecoxib  (CELEBREX ) 100 MG capsule; Take 1 capsule (100 mg total) by mouth daily.  Leg cramps Labs drawn today Continue with B12 supplementation  Will adjust treatment based on results Orders:   cyanocobalamin  (VITAMIN B12) 1000 MCG tablet; Take 1 tablet (1,000 mcg total) by mouth daily.  B12 deficiency Controlled Continue to monitor diet and exercise Labs drawn today Continue taking supplementation as directed Will adjust treatment based on results Orders:   cyanocobalamin  (VITAMIN B12) 1000 MCG tablet; Take 1 tablet (1,000 mcg total) by mouth daily.  Vitamin D  deficiency Controlled Continue to monitor diet and exercise Labs drawn today Continue taking supplementation as directed Will adjust treatment based on results Orders:   VITAMIN D  25 Hydroxy (Vit-D Deficiency, Fractures)  BMI 26.0-26.9,adult Continue to  monitor diet and exercise Labs drawn today Will adjust treatment depending on results Orders:   cyanocobalamin  (VITAMIN B12) 1000 MCG tablet; Take 1 tablet (1,000 mcg total) by mouth daily.  Adult Wellness Visit Routine follow-up with stable medications and controlled blood pressure. Nocturia likely from overactive bladder. Normal kidney function. - Perform blood tests as she fasted. - Continue current medications.  Body mass index is 26.92 kg/m.  Assessment and Plan Assessment & Plan   Overactive bladder with nocturia  Essential hypertension Blood pressure well-controlled with current regimen.     Recording duration: 27 minutes      Meds ordered this encounter  Medications   celecoxib  (CELEBREX ) 100 MG capsule    Sig: Take 1 capsule (100 mg total) by mouth daily.    Dispense:  90 capsule    Refill:  3   cyanocobalamin  (VITAMIN B12) 1000 MCG tablet    Sig: Take 1 tablet (1,000 mcg total) by mouth daily.    Dispense:  90 tablet    Refill:  3   LORazepam  (ATIVAN ) 0.5 MG tablet    Sig: Take 1 tablet (0.5 mg total) by mouth 2 (two) times daily as needed for anxiety.    Dispense:  60 tablet    Refill:  0   pramipexole  (MIRAPEX ) 0.25 MG tablet    Sig: Take 1 tablet (0.25 mg total) by mouth at bedtime. As needed    Dispense:  30 tablet    Refill:  1    This prescription was filled on 04/13/2024. Any refills authorized will be placed on file.   Evolocumab  (REPATHA  SURECLICK) 140 MG/ML SOAJ    Sig: Inject 140 mg into the skin every 14 (fourteen) days.    Dispense:  2 mL    Refill:  2   Vibegron  (GEMTESA ) 75 MG TABS    Sig: Take 1 tablet (75 mg total) by mouth daily.    Dispense:  30 tablet    Refill:  2    Orders Placed This Encounter  Procedures   Lipid  panel   Comprehensive metabolic panel with GFR   VITAMIN D  25 Hydroxy (Vit-D Deficiency, Fractures)       Follow-up: No follow-ups on file.  An After Visit Summary was printed and given to the  patient.    I,Lauren M Auman,acting as a neurosurgeon for [employer], [employer].,have documented all relevant documentation on the behalf of Nola Angles, PA,as directed by  Nola Angles, PA while in the presence of Nola Angles, [place].    Nola Angles, [place] [facility] [phone] "

## 2024-05-26 NOTE — Assessment & Plan Note (Signed)
 SABRA

## 2024-05-26 NOTE — Assessment & Plan Note (Signed)
 Susan Roberson

## 2024-05-27 ENCOUNTER — Encounter: Payer: Self-pay | Admitting: Physician Assistant

## 2024-05-27 ENCOUNTER — Ambulatory Visit (INDEPENDENT_AMBULATORY_CARE_PROVIDER_SITE_OTHER): Admitting: Physician Assistant

## 2024-05-27 VITALS — BP 120/70 | HR 71 | Temp 97.6°F | Resp 16 | Ht 71.0 in | Wt 193.0 lb

## 2024-05-27 DIAGNOSIS — N3941 Urge incontinence: Secondary | ICD-10-CM

## 2024-05-27 DIAGNOSIS — F411 Generalized anxiety disorder: Secondary | ICD-10-CM | POA: Diagnosis not present

## 2024-05-27 DIAGNOSIS — I10 Essential (primary) hypertension: Secondary | ICD-10-CM

## 2024-05-27 DIAGNOSIS — I700 Atherosclerosis of aorta: Secondary | ICD-10-CM

## 2024-05-27 DIAGNOSIS — M1991 Primary osteoarthritis, unspecified site: Secondary | ICD-10-CM

## 2024-05-27 DIAGNOSIS — E782 Mixed hyperlipidemia: Secondary | ICD-10-CM | POA: Diagnosis not present

## 2024-05-27 DIAGNOSIS — N1831 Chronic kidney disease, stage 3a: Secondary | ICD-10-CM | POA: Diagnosis not present

## 2024-05-27 DIAGNOSIS — R7303 Prediabetes: Secondary | ICD-10-CM | POA: Diagnosis not present

## 2024-05-27 DIAGNOSIS — E538 Deficiency of other specified B group vitamins: Secondary | ICD-10-CM | POA: Diagnosis not present

## 2024-05-27 DIAGNOSIS — Z6826 Body mass index (BMI) 26.0-26.9, adult: Secondary | ICD-10-CM

## 2024-05-27 DIAGNOSIS — R252 Cramp and spasm: Secondary | ICD-10-CM

## 2024-05-27 DIAGNOSIS — G2581 Restless legs syndrome: Secondary | ICD-10-CM

## 2024-05-27 DIAGNOSIS — E559 Vitamin D deficiency, unspecified: Secondary | ICD-10-CM | POA: Diagnosis not present

## 2024-05-27 HISTORY — DX: Body mass index (BMI) 26.0-26.9, adult: Z68.26

## 2024-05-27 HISTORY — DX: Primary osteoarthritis, unspecified site: M19.91

## 2024-05-27 HISTORY — DX: Cramp and spasm: R25.2

## 2024-05-27 HISTORY — DX: Chronic kidney disease, stage 3a: N18.31

## 2024-05-27 MED ORDER — PRAMIPEXOLE DIHYDROCHLORIDE 0.25 MG PO TABS: 0.2500 mg | ORAL_TABLET | Freq: Every day | ORAL | 1 refills | Status: DC

## 2024-05-27 MED ORDER — CELECOXIB 100 MG PO CAPS: 100.0000 mg | ORAL_CAPSULE | Freq: Every day | ORAL | 3 refills | Status: AC

## 2024-05-27 MED ORDER — VITAMIN B-12 1000 MCG PO TABS: 1000.0000 ug | ORAL_TABLET | Freq: Every day | ORAL | 3 refills | Status: AC

## 2024-05-27 MED ORDER — LORAZEPAM 0.5 MG PO TABS: 0.5000 mg | ORAL_TABLET | Freq: Two times a day (BID) | ORAL | 0 refills | Status: DC | PRN

## 2024-05-27 MED ORDER — REPATHA SURECLICK 140 MG/ML ~~LOC~~ SOAJ: 140.0000 mg | SUBCUTANEOUS | 2 refills | Status: DC

## 2024-05-27 MED ORDER — GEMTESA 75 MG PO TABS: 75.0000 mg | ORAL_TABLET | Freq: Every day | ORAL | 2 refills | Status: AC

## 2024-05-27 NOTE — Assessment & Plan Note (Addendum)
 Chronic pain of hip and knee due to osteoarthritis Chronic hip and knee pain. Prefers self-directed exercise at senior center, reporting improvement. - Continue self-directed exercise regimen at the senior center. - Refill Celebrex  prescription. Orders:   celecoxib  (CELEBREX ) 100 MG capsule; Take 1 capsule (100 mg total) by mouth daily.

## 2024-05-27 NOTE — Assessment & Plan Note (Addendum)
 Controlled Conitnue to monitor blood pressure Continue taking 40mg  Benicar , 25mg  hydrochlorothiazide  Labs drawn today Will adjust treatment based on BP readings BP Readings from Last 3 Encounters:  05/27/24 120/70  04/05/24 (!) 152/98  03/10/24 (!) 146/80    Orders:   Comprehensive metabolic panel with GFR

## 2024-05-27 NOTE — Assessment & Plan Note (Addendum)
 Controlled Continue to monitor diet and exercise Labs drawn today Continue taking supplementation as directed Will adjust treatment based on results Orders:   VITAMIN D  25 Hydroxy (Vit-D Deficiency, Fractures)

## 2024-05-27 NOTE — Assessment & Plan Note (Addendum)
 Continue to monitor diet and exercise Labs drawn today Will adjust treatment depending on results Orders:   cyanocobalamin  (VITAMIN B12) 1000 MCG tablet; Take 1 tablet (1,000 mcg total) by mouth daily.

## 2024-05-27 NOTE — Assessment & Plan Note (Signed)
 Frequent nocturia without pain. Discussed Gemtesa, potential side effects, and no need for kidney adjustment. No drug interactions. Goal to reduce nocturia and improve sleep. - Prescribe Gemtesa for overactive bladder. - Provide samples of Gemtesa if available. Orders:   Vibegron (GEMTESA) 75 MG TABS; Take 1 tablet (75 mg total) by mouth daily.

## 2024-05-27 NOTE — Assessment & Plan Note (Addendum)
 Controlled Labs drawn today We will adjust treatment depending on results

## 2024-05-27 NOTE — Assessment & Plan Note (Addendum)
 Labs drawn today Continue with B12 supplementation  Will adjust treatment based on results Orders:   cyanocobalamin  (VITAMIN B12) 1000 MCG tablet; Take 1 tablet (1,000 mcg total) by mouth daily.

## 2024-05-27 NOTE — Assessment & Plan Note (Addendum)
 Controlled Continue to monitor diet and exercise Labs drawn today Continue taking supplementation as directed Will adjust treatment based on results Orders:   cyanocobalamin  (VITAMIN B12) 1000 MCG tablet; Take 1 tablet (1,000 mcg total) by mouth daily.

## 2024-05-28 LAB — COMPREHENSIVE METABOLIC PANEL WITH GFR
ALT: 21 IU/L (ref 0–32)
AST: 32 IU/L (ref 0–40)
Albumin: 4.5 g/dL (ref 3.8–4.8)
Alkaline Phosphatase: 109 IU/L (ref 49–135)
BUN/Creatinine Ratio: 26 (ref 12–28)
BUN: 30 mg/dL — ABNORMAL HIGH (ref 8–27)
Bilirubin Total: 0.4 mg/dL (ref 0.0–1.2)
CO2: 23 mmol/L (ref 20–29)
Calcium: 9.9 mg/dL (ref 8.7–10.3)
Chloride: 99 mmol/L (ref 96–106)
Creatinine, Ser: 1.16 mg/dL — ABNORMAL HIGH (ref 0.57–1.00)
Globulin, Total: 2.9 g/dL (ref 1.5–4.5)
Glucose: 97 mg/dL (ref 70–99)
Potassium: 5.1 mmol/L (ref 3.5–5.2)
Sodium: 137 mmol/L (ref 134–144)
Total Protein: 7.4 g/dL (ref 6.0–8.5)
eGFR: 49 mL/min/1.73 — ABNORMAL LOW (ref >59)

## 2024-05-28 LAB — VITAMIN D 25 HYDROXY (VIT D DEFICIENCY, FRACTURES): Vit D, 25-Hydroxy: 37.1 ng/mL (ref 30.0–100.0)

## 2024-05-28 LAB — LIPID PANEL
Chol/HDL Ratio: 3.7 ratio (ref 0.0–4.4)
Cholesterol, Total: 223 mg/dL — ABNORMAL HIGH (ref 100–199)
HDL: 60 mg/dL (ref >39)
LDL Chol Calc (NIH): 135 mg/dL — ABNORMAL HIGH (ref 0–99)
Triglycerides: 158 mg/dL — ABNORMAL HIGH (ref 0–149)
VLDL Cholesterol Cal: 28 mg/dL (ref 5–40)

## 2024-06-01 ENCOUNTER — Ambulatory Visit: Payer: Self-pay | Admitting: Physician Assistant

## 2024-06-13 ENCOUNTER — Other Ambulatory Visit: Payer: Self-pay

## 2024-06-13 DIAGNOSIS — T8859XA Other complications of anesthesia, initial encounter: Secondary | ICD-10-CM | POA: Insufficient documentation

## 2024-06-13 DIAGNOSIS — C801 Malignant (primary) neoplasm, unspecified: Secondary | ICD-10-CM | POA: Insufficient documentation

## 2024-06-13 DIAGNOSIS — E875 Hyperkalemia: Secondary | ICD-10-CM | POA: Insufficient documentation

## 2024-06-13 DIAGNOSIS — M1990 Unspecified osteoarthritis, unspecified site: Secondary | ICD-10-CM | POA: Insufficient documentation

## 2024-06-13 NOTE — Progress Notes (Unsigned)
 Cardiology Office Note:    Date:  06/14/2024   ID:  Susan Roberson, DOB Jul 02, 1951, MRN [id number]  PCP:  Milon Cleaves, PA  Cardiologist:  Redell Leiter, MD    Referring MD: Milon Cleaves, [place]    ASSESSMENT:    1. Essential hypertension, benign   2. Mixed hyperlipidemia   3. Statin intolerance    PLAN:    In order of problems listed above:  Poorly controlled hypertension continue to trend monitor at home and start low-dose bedtime Catapres  To initiate Repatha  therapy   Next appointment: 3 months from a list of blood pressures 2 weeks   Medication Adjustments/Labs and Tests Ordered: Current medicines are reviewed at length with the patient today.  Concerns regarding medicines are outlined above.  Orders Placed This Encounter  Procedures   EKG 12-Lead   Meds ordered this encounter  Medications   cloNIDine  (CATAPRES ) 0.1 MG tablet    Sig: Take 1 tablet (0.1 mg total) by mouth at bedtime.    Dispense:  90 tablet    Refill:  3   DISCONTD: valsartan-hydrochlorothiazide  (DIOVAN HCT) 80-12.5 MG tablet    Sig: Take 1 tablet by mouth daily.    Dispense:  90 tablet    Refill:  3   olmesartan -hydrochlorothiazide  (BENICAR  HCT) 40-12.5 MG tablet    Sig: Take 1 tablet by mouth daily.    Dispense:  90 tablet    Refill:  3     History of Present Illness:    Susan Roberson is a 73 y.o. female with a hx of hypertension last seen 08/07/2023.  Initially told me that she had a stroke when she went to [facility] last summer reviewed the ED record she had severe hypertension but no stroke. Blood pressure had been well-controlled recently up to 168/102 and intensely stressed with her husband's cancer and her mother's terminal breast cancer No angina edema shortness of breath palpitation or syncope Blood pressure is elevated in my office 160/90 2 repeat by me 1 intensify treatment initially was going to had a calcium  channel blocker but she had intense edema with Norvasc then  spironolactone but her potassium is greater than 5 she has trouble sleeping at night and then add low-dose clonidine  at bedtime and combine her ARB thiazide into a single pill She has severe dyslipidemia LDL 135 non-HDL cholesterol greater than 160 and she is starting Repatha  Compliance with diet, lifestyle and medications: Yes Past Medical History:  Diagnosis Date   Acute non-recurrent maxillary sinusitis 08/13/2023   Arteriosclerosis of thoracic aorta 12/01/2023   Arthritis    Asthenia 03/16/2024   B12 deficiency 08/26/2022   Bilateral lower extremity edema 06/02/2023   BMI 26.0-26.9,adult 05/27/2024   Cancer (HCC)    pre cancerous skin cancer with multiple lesions removed   Complication of anesthesia    broken teeth   Dyspnea on exertion 06/02/2023   Encounter for Medicare annual wellness exam 04/05/2024   Essential (primary) hypertension    Generalized anxiety disorder    Hearing loss in right ear 08/13/2023   Hyperglycemia 12/12/2019   Hyperkalemia    Hypertension    Hypertensive emergency 05/12/2023   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 12/11/2019   Immunization due 08/13/2023   Leg cramps 05/27/2024   Leg weakness, bilateral 01/16/2023   Malignant melanoma of skin of lower limb, including hip (HCC) 12/11/2019   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   cloNIDine  (CATAPRES ) 0.1 MG tablet Take 1 tablet (0.1 mg total) by mouth at bedtime.   cyanocobalamin  (VITAMIN B12) 1000 MCG tablet Take 1 tablet (1,000 mcg total) by mouth daily.   Evolocumab  (REPATHA  SURECLICK) 140 MG/ML SOAJ Inject 140 mg into the skin every 14 (fourteen) days.   fluorouracil (EFUDEX) 5 % cream Apply topically.   LORazepam  (ATIVAN ) 0.5 MG tablet Take 1 tablet (0.5 mg total) by mouth 2 (two) times daily as needed for anxiety.   Magnesium 200 MG TABS Take by mouth.   olmesartan -hydrochlorothiazide  (BENICAR  HCT) 40-12.5 MG tablet Take 1 tablet by mouth daily.   pramipexole  (MIRAPEX ) 0.25 MG tablet Take 1 tablet (0.25 mg total) by mouth at bedtime. As needed   Vibegron (GEMTESA) 75 MG TABS Take 1 tablet (75 mg total) by mouth daily.   [DISCONTINUED] hydrochlorothiazide  (HYDRODIURIL ) 25 MG tablet Take 25 mg by mouth daily.   [DISCONTINUED] olmesartan  (BENICAR ) 40 MG tablet Take 1 tablet (40 mg total) by mouth daily.   [DISCONTINUED] valsartan-hydrochlorothiazide  (DIOVAN HCT) 80-12.5 MG tablet Take 1 tablet by mouth daily.      EKGs/Labs/Other Studies Reviewed:    The following studies were reviewed today:  Cardiac Studies & Procedures   ______________________________________________________________________________________________     ECHOCARDIOGRAM  ECHOCARDIOGRAM COMPLETE 06/29/2023  Narrative ECHOCARDIOGRAM REPORT    Patient Name:   Susan Roberson Date of Exam: 06/29/2023 Medical Rec #:  [id number]    Height:       70.0 in Accession #:    [id number]   Weight:       189.2 lb Date of Birth:  1951-07-01     BSA:          2.039 m Patient Age:    72 years     BP:           108/68 mmHg Patient Gender: F            HR:           59 bpm. Exam Location:  [place]  Procedure: 2D Echo, Cardiac Doppler, Color Doppler and Strain Analysis  Indications:    Essential hypertension, benign [I10 (ICD-10-CM)], Dyspnea R06.00  History:        Patient has no prior  history of Echocardiogram examinations.  Sonographer:    Lynwood Silvas RDCS Referring Phys: [phone] LAMAR JINNY FITCH   Sonographer Comments: Suboptimal apical window. IMPRESSIONS   1. Left ventricular ejection fraction, by estimation, is 60 to 65%. The left ventricle has normal function. The left ventricle has no regional wall motion abnormalities. There is mild left ventricular hypertrophy. Left ventricular diastolic parameters are consistent with Grade II diastolic dysfunction (pseudonormalization). The average left ventricular global longitudinal strain is -16.9 %. The global longitudinal strain is abnormal. 2. Right ventricular systolic function is normal. The right ventricular size is normal. There is normal pulmonary artery systolic pressure. 3. The mitral valve is normal in structure. Trivial mitral valve regurgitation. No evidence of mitral stenosis.  4. The aortic valve is normal in structure. Aortic valve regurgitation is not visualized. No aortic stenosis is present. 5. The inferior vena cava is normal in size with greater than 50% respiratory variability, suggesting right atrial pressure of 3 mmHg.  FINDINGS Left Ventricle: Left ventricular ejection fraction, by estimation, is 60 to 65%. The left ventricle has normal function. The left ventricle has no regional wall motion abnormalities. Definity  contrast agent was given IV to delineate the left ventricular endocardial borders. The average left ventricular global longitudinal strain is -16.9 %. The global longitudinal strain is abnormal. The left ventricular internal cavity size was normal in size. There is mild left ventricular hypertrophy. Left ventricular diastolic parameters are consistent with Grade II diastolic dysfunction (pseudonormalization).  Right Ventricle: The right ventricular size is normal. No increase in right ventricular wall thickness. Right ventricular systolic function is normal. There is normal pulmonary artery  systolic pressure. The tricuspid regurgitant velocity is 1.57 m/s, and with an assumed right atrial pressure of 3 mmHg, the estimated right ventricular systolic pressure is 12.9 mmHg.  Left Atrium: Left atrial size was normal in size.  Right Atrium: Right atrial size was normal in size.  Pericardium: There is no evidence of pericardial effusion.  Mitral Valve: The mitral valve is normal in structure. Trivial mitral valve regurgitation. No evidence of mitral valve stenosis.  Tricuspid Valve: The tricuspid valve is normal in structure. Tricuspid valve regurgitation is trivial. No evidence of tricuspid stenosis.  Aortic Valve: The aortic valve is normal in structure. Aortic valve regurgitation is not visualized. No aortic stenosis is present.  Pulmonic Valve: The pulmonic valve was normal in structure. Pulmonic valve regurgitation is not visualized. No evidence of pulmonic stenosis.  Aorta: The aortic root is normal in size and structure.  Venous: The inferior vena cava is normal in size with greater than 50% respiratory variability, suggesting right atrial pressure of 3 mmHg.  IAS/Shunts: No atrial level shunt detected by color flow Doppler.   LEFT VENTRICLE PLAX 2D LVIDd:         4.45 cm   Diastology LVIDs:         2.80 cm   LV e' medial:    6.09 cm/s LV PW:         1.15 cm   LV E/e' medial:  12.7 LV IVS:        1.20 cm   LV e' lateral:   7.83 cm/s LVOT diam:     2.00 cm   LV E/e' lateral: 9.9 LV SV:         86 LV SV Index:   42        2D Longitudinal Strain LVOT Area:     3.14 cm  2D Strain GLS Avg:     -16.9 %   RIGHT VENTRICLE             IVC RV Basal diam:  3.30 cm     IVC diam: 1.60 cm RV S prime:     10.80 cm/s TAPSE (M-mode): 2.7 cm  LEFT ATRIUM             Index        RIGHT ATRIUM           Index LA diam:        3.80 cm 1.86 cm/m   RA Area:     14.80 cm LA Vol (A2C):   58.0 ml 28.45 ml/m  RA Volume:   40.40 ml  19.82  ml/m LA Vol (A4C):   65.8 ml 32.28  ml/m LA Biplane Vol: 67.1 ml 32.91 ml/m AORTIC VALVE LVOT Vmax:   109.50 cm/s LVOT Vmean:  72.850 cm/s LVOT VTI:    0.274 m  AORTA Ao Root diam: 2.60 cm Ao Asc diam:  3.00 cm Ao Desc diam: 2.30 cm  MV E velocity: 77.60 cm/s  TRICUSPID VALVE MV A velocity: 81.20 cm/s  TR Peak grad:   9.9 mmHg MV E/A ratio:  0.96        TR Vmax:        157.00 cm/s  SHUNTS Systemic VTI:  0.27 m Systemic Diam: 2.00 cm  Lamar Fitch MD Electronically signed by Lamar Fitch MD Signature Date/Time: 06/29/2023/11:48:50 AM    Final          ______________________________________________________________________________________________      EKG Interpretation Date/Time:  Tuesday June 14 2024 13:03:36 EDT Ventricular Rate:  71 PR Interval:  154 QRS Duration:  94 QT Interval:  418 QTC Calculation: 454 R Axis:   -53  Text Interpretation: Normal sinus rhythm Left axis deviation Pulmonary disease pattern late transition When compared with ECG of 02-Jun-2023 14:55, No significant change was found Confirmed by Monetta Rogue ([id number]) on 06/14/2024 1:06:36 PM   Recent Labs: 04/05/2024: Hemoglobin 12.7; Platelets 366; TSH 2.710 05/27/2024: ALT 21; BUN 30; Creatinine, Ser 1.16; Potassium 5.1; Sodium 137  Recent Lipid Panel    Component Value Date/Time   CHOL 223 (H) 05/27/2024 0837   TRIG 158 (H) 05/27/2024 0837   HDL 60 05/27/2024 0837   CHOLHDL 3.7 05/27/2024 0837   LDLCALC 135 (H) 05/27/2024 0837    Physical Exam:    VS:  BP (!) 140/82   Pulse 71   Ht 5' 11 (1.803 m)   Wt 196 lb 6.4 oz (89.1 kg)   SpO2 95%   BMI 27.39 kg/m     Wt Readings from Last 3 Encounters:  06/14/24 196 lb 6.4 oz (89.1 kg)  05/27/24 193 lb (87.5 kg)  04/05/24 194 lb (88 kg)     GEN:  Well nourished, well developed in no acute distress HEENT: Normal NECK: No JVD; No carotid bruits LYMPHATICS: No lymphadenopathy CARDIAC: RRR, no murmurs, rubs, gallops RESPIRATORY:  Clear to auscultation  without rales, wheezing or rhonchi  ABDOMEN: Soft, non-tender, non-distended MUSCULOSKELETAL:  No edema; No deformity  SKIN: Warm and dry NEUROLOGIC:  Alert and oriented x 3 PSYCHIATRIC:  Normal affect    Signed, Rogue Monetta, MD  06/14/2024 1:58 PM    [facility]

## 2024-06-14 ENCOUNTER — Encounter: Payer: Self-pay | Admitting: Cardiology

## 2024-06-14 ENCOUNTER — Ambulatory Visit: Attending: Cardiology | Admitting: Cardiology

## 2024-06-14 VITALS — BP 140/82 | HR 71 | Ht 71.0 in | Wt 196.4 lb

## 2024-06-14 DIAGNOSIS — E782 Mixed hyperlipidemia: Secondary | ICD-10-CM | POA: Diagnosis not present

## 2024-06-14 DIAGNOSIS — I10 Essential (primary) hypertension: Secondary | ICD-10-CM | POA: Diagnosis not present

## 2024-06-14 DIAGNOSIS — Z789 Other specified health status: Secondary | ICD-10-CM | POA: Diagnosis not present

## 2024-06-14 MED ORDER — CLONIDINE HCL 0.1 MG PO TABS: 0.1000 mg | ORAL_TABLET | Freq: Every day | ORAL | 3 refills | Status: AC

## 2024-06-14 MED ORDER — VALSARTAN-HYDROCHLOROTHIAZIDE 80-12.5 MG PO TABS: 1.0000 | ORAL_TABLET | Freq: Every day | ORAL | 3 refills | Status: DC

## 2024-06-14 MED ORDER — OLMESARTAN MEDOXOMIL-HCTZ 40-12.5 MG PO TABS: 1.0000 | ORAL_TABLET | Freq: Every day | ORAL | 3 refills | Status: AC

## 2024-06-14 NOTE — Patient Instructions (Addendum)
 Medication Instructions:  Your physician has recommended you make the following change in your medication:   START: Clonidine  0.1 mg daily at bed time START: Olmesartan / Hydrochlorothiazide  40/ 12.5 mg 1 tablet daily  *If you need a refill on your cardiac medications before your next appointment, please call your pharmacy*  Lab Work: None If you have labs (blood work) drawn today and your tests are completely normal, you will receive your results only by: MyChart Message (if you have MyChart) OR A paper copy in the mail If you have any lab test that is abnormal or we need to change your treatment, we will call you to review the results.  Testing/Procedures: None  Follow-Up: At [facility], you and your health needs are our priority.  As part of our continuing mission to provide you with exceptional heart care, our providers are all part of one team.  This team includes your primary Cardiologist (physician) and Advanced Practice Providers or APPs (Physician Assistants and Nurse Practitioners) who all work together to provide you with the care you need, when you need it.  Your next appointment:   3 month(s)  Provider:   Redell Leiter, MD    We recommend signing up for the patient portal called MyChart.  Sign up information is provided on this After Visit Summary.  MyChart is used to connect with patients for Virtual Visits (Telemedicine).  Patients are able to view lab/test results, encounter notes, upcoming appointments, etc.  Non-urgent messages can be sent to your provider as well.   To learn more about what you can do with MyChart, go to [email or web address].   Other Instructions Check and record blood pressures for 2 weeks and then after two weeks send in a list via MyChart.

## 2024-06-20 ENCOUNTER — Telehealth: Payer: Self-pay

## 2024-06-20 ENCOUNTER — Other Ambulatory Visit: Payer: Self-pay

## 2024-06-20 NOTE — Telephone Encounter (Signed)
 Patient came to the office and was concerned because her pharmacy had prescriptions for valsartan/ hydrochlorothiazide  and Olmesartan / hydrochlorothiazide . I explained that originally Dr. Monetta had wanted to switch her to Valsartan but changed his mid and decided to leave her on the olmesartan  when he ordered the olmesartan / hydrochlorothiazide  medication. Patient verbalized understanding and had no further questions at this time.

## 2024-06-25 ENCOUNTER — Other Ambulatory Visit: Payer: Self-pay | Admitting: Cardiology

## 2024-07-03 ENCOUNTER — Other Ambulatory Visit: Payer: Self-pay | Admitting: Physician Assistant

## 2024-07-03 DIAGNOSIS — E782 Mixed hyperlipidemia: Secondary | ICD-10-CM

## 2024-07-07 DIAGNOSIS — H34811 Central retinal vein occlusion, right eye, with macular edema: Secondary | ICD-10-CM | POA: Diagnosis not present

## 2024-07-08 ENCOUNTER — Telehealth: Payer: Self-pay

## 2024-07-08 ENCOUNTER — Other Ambulatory Visit: Payer: Self-pay

## 2024-07-08 MED ORDER — BUSPIRONE HCL 5 MG PO TABS: 5.0000 mg | ORAL_TABLET | Freq: Two times a day (BID) | ORAL | 0 refills | Status: AC

## 2024-07-08 NOTE — Telephone Encounter (Signed)
 Please advice   Copied from CRM [id number]. Topic: Clinical - Medication Question >> Jul 07, 2024  3:25 PM Shanda MATSU wrote: Reason for CRM: Patient called in wanting to know if there is any way her anxiety med can be changed from LORazepam  (ATIVAN ) 0.5 MG tablet to buspirone  hcl 5mg , patient stated that her eye doctor recommended this change.

## 2024-07-08 NOTE — Telephone Encounter (Signed)
 Patient was informed. She is agreed.

## 2024-08-09 ENCOUNTER — Other Ambulatory Visit: Payer: Self-pay | Admitting: Physician Assistant

## 2024-08-09 DIAGNOSIS — F411 Generalized anxiety disorder: Secondary | ICD-10-CM

## 2024-08-24 ENCOUNTER — Other Ambulatory Visit: Payer: Self-pay | Admitting: Physician Assistant

## 2024-08-24 DIAGNOSIS — G2581 Restless legs syndrome: Secondary | ICD-10-CM

## 2024-08-24 NOTE — Progress Notes (Signed)
 Susan Roberson                                          MRN: [id number]   08/24/2024   The [facility] Quality Team Specialist reviewed this patient medical record for the purposes of chart review for care gap closure. The following were reviewed: chart review for care gap closure-controlling blood pressure.    [facility]I Quality Team

## 2024-09-15 ENCOUNTER — Ambulatory Visit: Admitting: Cardiology

## 2024-10-21 ENCOUNTER — Ambulatory Visit: Admitting: Cardiology
